# Patient Record
Sex: Female | Born: 1985 | Race: White | Hispanic: No | Marital: Married | Smoking: Current every day smoker
Health system: Southern US, Community
[De-identification: ages and names within clinical notes are randomized; demographics above are authoritative.]

## PROBLEM LIST (undated history)

## (undated) DIAGNOSIS — F419 Anxiety disorder, unspecified: Secondary | ICD-10-CM

## (undated) DIAGNOSIS — F329 Major depressive disorder, single episode, unspecified: Secondary | ICD-10-CM

## (undated) DIAGNOSIS — E282 Polycystic ovarian syndrome: Secondary | ICD-10-CM

## (undated) DIAGNOSIS — K760 Fatty (change of) liver, not elsewhere classified: Secondary | ICD-10-CM

## (undated) DIAGNOSIS — R112 Nausea with vomiting, unspecified: Secondary | ICD-10-CM

## (undated) DIAGNOSIS — M545 Low back pain, unspecified: Secondary | ICD-10-CM

## (undated) DIAGNOSIS — T402X5A Adverse effect of other opioids, initial encounter: Secondary | ICD-10-CM

## (undated) DIAGNOSIS — R000 Tachycardia, unspecified: Secondary | ICD-10-CM

## (undated) DIAGNOSIS — K5903 Drug induced constipation: Secondary | ICD-10-CM

## (undated) DIAGNOSIS — E785 Hyperlipidemia, unspecified: Secondary | ICD-10-CM

## (undated) DIAGNOSIS — E119 Type 2 diabetes mellitus without complications: Secondary | ICD-10-CM

## (undated) DIAGNOSIS — M5410 Radiculopathy, site unspecified: Secondary | ICD-10-CM

## (undated) DIAGNOSIS — M1990 Unspecified osteoarthritis, unspecified site: Secondary | ICD-10-CM

## (undated) DIAGNOSIS — Z9889 Other specified postprocedural states: Secondary | ICD-10-CM

## (undated) DIAGNOSIS — G8929 Other chronic pain: Secondary | ICD-10-CM

## (undated) HISTORY — PX: CHOLECYSTECTOMY: SHX55

## (undated) HISTORY — PX: TUBAL LIGATION: SHX77

## (undated) HISTORY — PX: SPINAL CORD STIMULATOR REMOVAL: SHX5379

## (undated) HISTORY — PX: WISDOM TOOTH EXTRACTION: SHX21

## (undated) HISTORY — PX: LUMBAR DISC SURGERY: SHX700

## (undated) HISTORY — PX: BACK SURGERY: SHX140

---
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
## 2005-10-31 HISTORY — PX: KNEE ARTHROSCOPY: SUR90

## 2012-10-29 ENCOUNTER — Other Ambulatory Visit: Payer: Self-pay | Admitting: Orthopedic Surgery

## 2012-10-31 DIAGNOSIS — F32A Depression, unspecified: Secondary | ICD-10-CM

## 2012-10-31 HISTORY — PX: POSTERIOR LUMBAR FUSION: SHX6036

## 2012-10-31 HISTORY — DX: Depression, unspecified: F32.A

## 2012-11-06 ENCOUNTER — Encounter (HOSPITAL_COMMUNITY): Payer: Self-pay

## 2012-11-06 ENCOUNTER — Encounter (HOSPITAL_COMMUNITY)
Admission: RE | Admit: 2012-11-06 | Discharge: 2012-11-06 | Disposition: A | Discharge status: Home / Self Care | Payer: Worker's Compensation | Source: Ambulatory Visit | Attending: Orthopedic Surgery | Admitting: Orthopedic Surgery

## 2012-11-06 HISTORY — DX: Nausea with vomiting, unspecified: R11.2

## 2012-11-06 HISTORY — DX: Other specified postprocedural states: Z98.890

## 2012-11-06 LAB — COMPREHENSIVE METABOLIC PANEL
ALT: 91 U/L — ABNORMAL HIGH (ref 0–35)
AST: 69 U/L — ABNORMAL HIGH (ref 0–37)
Albumin: 3.7 g/dL (ref 3.5–5.2)
Alkaline Phosphatase: 89 U/L (ref 39–117)
Calcium: 9.6 mg/dL (ref 8.4–10.5)
Creatinine, Ser: 0.47 mg/dL — ABNORMAL LOW (ref 0.50–1.10)
Potassium: 3.6 mEq/L (ref 3.5–5.1)
Total Protein: 7.9 g/dL (ref 6.0–8.3)

## 2012-11-06 LAB — APTT: aPTT: 30 seconds (ref 24–37)

## 2012-11-06 LAB — CBC WITH DIFFERENTIAL/PLATELET
Basophils Absolute: 0.1 10*3/uL (ref 0.0–0.1)
Basophils Relative: 1 % (ref 0–1)
Eosinophils Relative: 5 % (ref 0–5)
HCT: 48.3 % — ABNORMAL HIGH (ref 36.0–46.0)
MCHC: 32.7 g/dL (ref 30.0–36.0)
MCV: 89.6 fL (ref 78.0–100.0)
Monocytes Absolute: 0.7 10*3/uL (ref 0.1–1.0)
Platelets: 273 10*3/uL (ref 150–400)
RDW: 12.7 % (ref 11.5–15.5)

## 2012-11-06 LAB — TYPE AND SCREEN
ABO/RH(D): O POS
Antibody Screen: NEGATIVE

## 2012-11-06 LAB — URINALYSIS, ROUTINE W REFLEX MICROSCOPIC
Glucose, UA: NEGATIVE mg/dL
Ketones, ur: NEGATIVE mg/dL
Leukocytes, UA: NEGATIVE
Nitrite: NEGATIVE
Protein, ur: NEGATIVE mg/dL

## 2012-11-06 LAB — PROTIME-INR: INR: 0.9 (ref 0.00–1.49)

## 2012-11-06 LAB — SURGICAL PCR SCREEN: MRSA, PCR: NEGATIVE

## 2012-11-06 MED ORDER — CHLORHEXIDINE GLUCONATE 4 % EX LIQD: 60.0000 mL | Freq: Once | CUTANEOUS | Status: DC

## 2012-11-06 MED ORDER — CEFAZOLIN SODIUM-DEXTROSE 2-3 GM-% IV SOLR
2.0000 g | INTRAVENOUS | Status: AC
  Administered 2012-11-07: 2 g via INTRAVENOUS
  Filled 2012-11-06: qty 50

## 2012-11-06 NOTE — Pre-Procedure Instructions (Signed)
[address] Misty Stanley  11/06/2012   Your procedure is scheduled on:  Wednesday, January 8th.    Call this number the morning of surgery: [phone] around 8:00 to find out what time you should arrive.   Remember:Nothing to eat or drink after Midnight.   Take these medicines the morning of surgery with A SIP OF WATER: May take Oxycodone and Flexeril if needed.    Do not wear jewelry, make-up or nail polish.  Do not wear lotions, powders, or perfumes. You may wear deodorant.  Do not shave 48 hours prior to surgery. Men may shave face and neck.  Do not bring valuables to the hospital.  Contacts, dentures or bridgework may not be worn into surgery.  Leave suitcase in the car. After surgery it may be brought to your room.  For patients admitted to the hospital, checkout time is 11:00 AM the day of discharge.   Patients discharged the day of surgery will not be allowed to drive home.  Name and phone number of your driver: NA   Shower with CHG wash (Bactoshield) tonight and again in the am prior to arriving to hospital.    Please read over the following fact sheets that you were given: Pain Booklet, Coughing and Deep Breathing, Blood Transfusion Information and Surgical Site Infection Prevention

## 2012-11-07 ENCOUNTER — Encounter (HOSPITAL_COMMUNITY)
Admission: RE | Disposition: A | Discharge status: Home / Self Care | Payer: Self-pay | Source: Ambulatory Visit | Attending: Orthopedic Surgery

## 2012-11-07 ENCOUNTER — Encounter (HOSPITAL_COMMUNITY): Payer: Self-pay | Admitting: Surgery

## 2012-11-07 ENCOUNTER — Encounter (HOSPITAL_COMMUNITY): Payer: Self-pay | Admitting: Anesthesiology

## 2012-11-07 ENCOUNTER — Inpatient Hospital Stay (HOSPITAL_COMMUNITY): Payer: Worker's Compensation

## 2012-11-07 ENCOUNTER — Inpatient Hospital Stay (HOSPITAL_COMMUNITY): Payer: Worker's Compensation | Admitting: Anesthesiology

## 2012-11-07 ENCOUNTER — Inpatient Hospital Stay (HOSPITAL_COMMUNITY)
Admission: RE | Admit: 2012-11-07 | Discharge: 2012-11-10 | DRG: 460 | Disposition: A | Discharge status: Home / Self Care | Payer: Worker's Compensation | Source: Ambulatory Visit | Attending: Orthopedic Surgery | Admitting: Orthopedic Surgery

## 2012-11-07 ENCOUNTER — Other Ambulatory Visit: Payer: Self-pay

## 2012-11-07 DIAGNOSIS — M47812 Spondylosis without myelopathy or radiculopathy, cervical region: Secondary | ICD-10-CM | POA: Diagnosis present

## 2012-11-07 DIAGNOSIS — Z6841 Body Mass Index (BMI) 40.0 and over, adult: Secondary | ICD-10-CM

## 2012-11-07 DIAGNOSIS — M5126 Other intervertebral disc displacement, lumbar region: Principal | ICD-10-CM | POA: Diagnosis present

## 2012-11-07 LAB — HCG, SERUM, QUALITATIVE: Preg, Serum: NEGATIVE

## 2012-11-07 SURGERY — POSTERIOR LUMBAR FUSION 1 LEVEL
Anesthesia: General | Site: Spine Lumbar | Laterality: Left | Wound class: Clean

## 2012-11-07 MED ORDER — ACETAMINOPHEN 325 MG PO TABS: 650.0000 mg | ORAL_TABLET | ORAL | Status: DC | PRN

## 2012-11-07 MED ORDER — DIPHENHYDRAMINE HCL 50 MG/ML IJ SOLN: 12.5000 mg | Freq: Four times a day (QID) | INTRAMUSCULAR | Status: DC | PRN

## 2012-11-07 MED ORDER — HYDROMORPHONE HCL PF 1 MG/ML IJ SOLN
0.2500 mg | INTRAMUSCULAR | Status: DC | PRN
  Administered 2012-11-07 (×2): 0.5 mg via INTRAVENOUS

## 2012-11-07 MED ORDER — MORPHINE SULFATE 2 MG/ML IJ SOLN
2.0000 mg | INTRAMUSCULAR | Status: DC | PRN
  Administered 2012-11-08: 2 mg via INTRAVENOUS
  Filled 2012-11-07 (×2): qty 1

## 2012-11-07 MED ORDER — ZOLPIDEM TARTRATE 5 MG PO TABS
5.0000 mg | ORAL_TABLET | Freq: Every evening | ORAL | Status: DC | PRN
  Administered 2012-11-10: 5 mg via ORAL
  Filled 2012-11-07: qty 1

## 2012-11-07 MED ORDER — MORPHINE SULFATE (PF) 1 MG/ML IV SOLN
INTRAVENOUS | Status: DC
  Administered 2012-11-08 (×2): via INTRAVENOUS
  Administered 2012-11-08: 17.52 mg via INTRAVENOUS
  Administered 2012-11-08: 12.53 mg via INTRAVENOUS
  Administered 2012-11-08: 21 mg via INTRAVENOUS
  Filled 2012-11-07 (×2): qty 25

## 2012-11-07 MED ORDER — LIDOCAINE HCL (CARDIAC) 20 MG/ML IV SOLN
INTRAVENOUS | Status: DC | PRN
  Administered 2012-11-07: 10 mg via INTRAVENOUS

## 2012-11-07 MED ORDER — THROMBIN 20000 UNITS EX SOLR
OROMUCOSAL | Status: DC | PRN
  Administered 2012-11-07: 15:00:00 via TOPICAL

## 2012-11-07 MED ORDER — POVIDONE-IODINE 7.5 % EX SOLN
Freq: Once | CUTANEOUS | Status: DC
  Filled 2012-11-07: qty 118

## 2012-11-07 MED ORDER — MORPHINE SULFATE (PF) 1 MG/ML IV SOLN
INTRAVENOUS | Status: AC
  Administered 2012-11-07: 19:00:00
  Filled 2012-11-07: qty 25

## 2012-11-07 MED ORDER — PROPOFOL INFUSION 10 MG/ML OPTIME
INTRAVENOUS | Status: DC | PRN
  Administered 2012-11-07: 75 ug/kg/min via INTRAVENOUS

## 2012-11-07 MED ORDER — POTASSIUM CHLORIDE IN NACL 20-0.9 MEQ/L-% IV SOLN
INTRAVENOUS | Status: DC
  Administered 2012-11-07: via INTRAVENOUS
  Filled 2012-11-07 (×7): qty 1000

## 2012-11-07 MED ORDER — THROMBIN 20000 UNITS EX SOLR
CUTANEOUS | Status: AC
  Filled 2012-11-07: qty 40000

## 2012-11-07 MED ORDER — MENTHOL 3 MG MT LOZG: 1.0000 | LOZENGE | OROMUCOSAL | Status: DC | PRN

## 2012-11-07 MED ORDER — NALOXONE HCL 0.4 MG/ML IJ SOLN: 0.4000 mg | INTRAMUSCULAR | Status: DC | PRN

## 2012-11-07 MED ORDER — ONDANSETRON HCL 4 MG/2ML IJ SOLN: 4.0000 mg | INTRAMUSCULAR | Status: DC | PRN

## 2012-11-07 MED ORDER — SODIUM CHLORIDE 0.9 % IV SOLN: 250.0000 mL | INTRAVENOUS | Status: DC

## 2012-11-07 MED ORDER — HYDROMORPHONE HCL PF 1 MG/ML IJ SOLN
INTRAMUSCULAR | Status: AC
  Filled 2012-11-07: qty 1

## 2012-11-07 MED ORDER — ARTIFICIAL TEARS OP OINT
TOPICAL_OINTMENT | OPHTHALMIC | Status: DC | PRN
  Administered 2012-11-07: 1 via OPHTHALMIC

## 2012-11-07 MED ORDER — OXYCODONE HCL 5 MG PO TABS: 5.0000 mg | ORAL_TABLET | Freq: Once | ORAL | Status: DC | PRN

## 2012-11-07 MED ORDER — CEFAZOLIN SODIUM 1-5 GM-% IV SOLN
1.0000 g | Freq: Three times a day (TID) | INTRAVENOUS | Status: AC
  Administered 2012-11-07 – 2012-11-08 (×2): 1 g via INTRAVENOUS
  Filled 2012-11-07 (×2): qty 50

## 2012-11-07 MED ORDER — ONDANSETRON HCL 4 MG/2ML IJ SOLN: 4.0000 mg | Freq: Four times a day (QID) | INTRAMUSCULAR | Status: DC | PRN

## 2012-11-07 MED ORDER — 0.9 % SODIUM CHLORIDE (POUR BTL) OPTIME
TOPICAL | Status: DC | PRN
  Administered 2012-11-07: 1000 mL

## 2012-11-07 MED ORDER — PHENOL 1.4 % MT LIQD: 1.0000 | OROMUCOSAL | Status: DC | PRN

## 2012-11-07 MED ORDER — PROMETHAZINE HCL 25 MG/ML IJ SOLN
INTRAMUSCULAR | Status: AC
  Administered 2012-11-07: 6.25 mg via INTRAVENOUS
  Filled 2012-11-07: qty 1

## 2012-11-07 MED ORDER — SODIUM CHLORIDE 0.9 % IJ SOLN: 3.0000 mL | INTRAMUSCULAR | Status: DC | PRN

## 2012-11-07 MED ORDER — PROMETHAZINE HCL 25 MG/ML IJ SOLN
6.2500 mg | INTRAMUSCULAR | Status: DC | PRN
  Administered 2012-11-07: 6.25 mg via INTRAVENOUS

## 2012-11-07 MED ORDER — LACTATED RINGERS IV SOLN
INTRAVENOUS | Status: DC | PRN
  Administered 2012-11-07 (×2): via INTRAVENOUS

## 2012-11-07 MED ORDER — THROMBIN 20000 UNITS EX SOLR
CUTANEOUS | Status: DC | PRN
  Administered 2012-11-07: 20000 [IU] via TOPICAL

## 2012-11-07 MED ORDER — FENTANYL CITRATE 0.05 MG/ML IJ SOLN
INTRAMUSCULAR | Status: DC | PRN
  Administered 2012-11-07: 100 ug via INTRAVENOUS
  Administered 2012-11-07 (×3): 50 ug via INTRAVENOUS
  Administered 2012-11-07: 200 ug via INTRAVENOUS
  Administered 2012-11-07 (×4): 100 ug via INTRAVENOUS

## 2012-11-07 MED ORDER — OXYCODONE HCL 5 MG/5ML PO SOLN: 5.0000 mg | Freq: Once | ORAL | Status: DC | PRN

## 2012-11-07 MED ORDER — SCOPOLAMINE 1 MG/3DAYS TD PT72
1.0000 | MEDICATED_PATCH | TRANSDERMAL | Status: DC
  Administered 2012-11-07: 1.5 mg via TRANSDERMAL
  Filled 2012-11-07: qty 1

## 2012-11-07 MED ORDER — BUPIVACAINE-EPINEPHRINE 0.25% -1:200000 IJ SOLN
INTRAMUSCULAR | Status: DC | PRN
  Administered 2012-11-07: 6 mL

## 2012-11-07 MED ORDER — DIAZEPAM 5 MG PO TABS
5.0000 mg | ORAL_TABLET | Freq: Four times a day (QID) | ORAL | Status: DC | PRN
  Administered 2012-11-08 – 2012-11-10 (×9): 5 mg via ORAL
  Filled 2012-11-07 (×9): qty 1

## 2012-11-07 MED ORDER — DOCUSATE SODIUM 100 MG PO CAPS
100.0000 mg | ORAL_CAPSULE | Freq: Two times a day (BID) | ORAL | Status: DC
  Administered 2012-11-07 – 2012-11-10 (×6): 100 mg via ORAL
  Filled 2012-11-07 (×7): qty 1

## 2012-11-07 MED ORDER — ACETAMINOPHEN 650 MG RE SUPP: 650.0000 mg | RECTAL | Status: DC | PRN

## 2012-11-07 MED ORDER — MIDAZOLAM HCL 2 MG/2ML IJ SOLN: 0.5000 mg | Freq: Once | INTRAMUSCULAR | Status: DC | PRN

## 2012-11-07 MED ORDER — SODIUM CHLORIDE 0.9 % IJ SOLN
3.0000 mL | Freq: Two times a day (BID) | INTRAMUSCULAR | Status: DC
  Administered 2012-11-07 – 2012-11-09 (×4): 3 mL via INTRAVENOUS

## 2012-11-07 MED ORDER — ACETAMINOPHEN 10 MG/ML IV SOLN
1000.0000 mg | Freq: Once | INTRAVENOUS | Status: AC
  Administered 2012-11-07: 1000 mg via INTRAVENOUS
  Filled 2012-11-07: qty 100

## 2012-11-07 MED ORDER — MUPIROCIN 2 % EX OINT
TOPICAL_OINTMENT | CUTANEOUS | Status: AC
  Administered 2012-11-07: 1 via NASAL
  Filled 2012-11-07: qty 22

## 2012-11-07 MED ORDER — SODIUM CHLORIDE 0.9 % IJ SOLN: 9.0000 mL | INTRAMUSCULAR | Status: DC | PRN

## 2012-11-07 MED ORDER — DIPHENHYDRAMINE HCL 12.5 MG/5ML PO ELIX
12.5000 mg | ORAL_SOLUTION | Freq: Four times a day (QID) | ORAL | Status: DC | PRN
  Filled 2012-11-07: qty 10

## 2012-11-07 MED ORDER — SENNA 8.6 MG PO TABS
1.0000 | ORAL_TABLET | Freq: Two times a day (BID) | ORAL | Status: DC
  Administered 2012-11-07 – 2012-11-10 (×6): 8.6 mg via ORAL
  Filled 2012-11-07 (×7): qty 1

## 2012-11-07 MED ORDER — MUPIROCIN 2 % EX OINT
TOPICAL_OINTMENT | Freq: Two times a day (BID) | CUTANEOUS | Status: DC
  Administered 2012-11-07: 1 via NASAL
  Administered 2012-11-08 – 2012-11-10 (×5): via NASAL
  Filled 2012-11-07 (×2): qty 22

## 2012-11-07 MED ORDER — PROPOFOL 10 MG/ML IV BOLUS
INTRAVENOUS | Status: DC | PRN
  Administered 2012-11-07: 200 mg via INTRAVENOUS

## 2012-11-07 MED ORDER — MEPERIDINE HCL 25 MG/ML IJ SOLN: 6.2500 mg | INTRAMUSCULAR | Status: DC | PRN

## 2012-11-07 MED ORDER — ACETAMINOPHEN 10 MG/ML IV SOLN
INTRAVENOUS | Status: AC
  Administered 2012-11-07: 1000 mg via INTRAVENOUS
  Filled 2012-11-07: qty 100

## 2012-11-07 MED ORDER — LACTATED RINGERS IV SOLN
INTRAVENOUS | Status: DC
  Administered 2012-11-07 (×2): via INTRAVENOUS

## 2012-11-07 MED ORDER — SODIUM CHLORIDE 0.9 % IV SOLN
INTRAVENOUS | Status: DC | PRN
  Administered 2012-11-07: 17:00:00 via INTRAVENOUS

## 2012-11-07 MED ORDER — OXYCODONE-ACETAMINOPHEN 5-325 MG PO TABS
ORAL_TABLET | ORAL | Status: AC
  Filled 2012-11-07: qty 2

## 2012-11-07 MED ORDER — OXYCODONE-ACETAMINOPHEN 5-325 MG PO TABS
1.0000 | ORAL_TABLET | ORAL | Status: DC | PRN
  Administered 2012-11-07 – 2012-11-10 (×11): 2 via ORAL
  Filled 2012-11-07 (×10): qty 2

## 2012-11-07 MED ORDER — SCOPOLAMINE 1 MG/3DAYS TD PT72
MEDICATED_PATCH | TRANSDERMAL | Status: AC
  Filled 2012-11-07: qty 1

## 2012-11-07 MED ORDER — VECURONIUM BROMIDE 10 MG IV SOLR
INTRAVENOUS | Status: DC | PRN
  Administered 2012-11-07: 2 mg via INTRAVENOUS
  Administered 2012-11-07: 1 mg via INTRAVENOUS
  Administered 2012-11-07: 8 mg via INTRAVENOUS
  Administered 2012-11-07: 1 mg via INTRAVENOUS

## 2012-11-07 MED ORDER — ALUM & MAG HYDROXIDE-SIMETH 200-200-20 MG/5ML PO SUSP: 30.0000 mL | Freq: Four times a day (QID) | ORAL | Status: DC | PRN

## 2012-11-07 MED ORDER — PHENYLEPHRINE HCL 10 MG/ML IJ SOLN
INTRAMUSCULAR | Status: DC | PRN
  Administered 2012-11-07: 80 ug via INTRAVENOUS
  Administered 2012-11-07: 40 ug via INTRAVENOUS
  Administered 2012-11-07: 80 ug via INTRAVENOUS

## 2012-11-07 MED ORDER — ONDANSETRON HCL 4 MG/2ML IJ SOLN
INTRAMUSCULAR | Status: DC | PRN
  Administered 2012-11-07: 4 mg via INTRAVENOUS

## 2012-11-07 SURGICAL SUPPLY — 74 items
BENZOIN TINCTURE PRP APPL 2/3 (GAUZE/BANDAGES/DRESSINGS) ×2 IMPLANT
BLADE SURG ROTATE 9660 (MISCELLANEOUS) IMPLANT
BUR ROUND PRECISION 4.0 (BURR) ×2 IMPLANT
CAGE CONCORDE BULLET 9X11X27 (Cage) ×1 IMPLANT
CAGE SPNL 5D BLT NOSE 27X9X11 (Cage) ×1 IMPLANT
CARTRIDGE OIL MAESTRO DRILL (MISCELLANEOUS) IMPLANT
CLOTH BEACON ORANGE TIMEOUT ST (SAFETY) ×2 IMPLANT
CLSR STERI-STRIP ANTIMIC 1/2X4 (GAUZE/BANDAGES/DRESSINGS) ×2 IMPLANT
CONT SPEC STER OR (MISCELLANEOUS) ×2 IMPLANT
CORDS BIPOLAR (ELECTRODE) ×2 IMPLANT
COVER SURGICAL LIGHT HANDLE (MISCELLANEOUS) ×2 IMPLANT
DIFFUSER DRILL AIR PNEUMATIC (MISCELLANEOUS) IMPLANT
DRAIN CHANNEL 15F RND FF W/TCR (WOUND CARE) IMPLANT
DRAPE C-ARM 42X72 X-RAY (DRAPES) ×2 IMPLANT
DRAPE ORTHO SPLIT 77X108 STRL (DRAPES) ×1
DRAPE POUCH INSTRU U-SHP 10X18 (DRAPES) ×2 IMPLANT
DRAPE SURG 17X23 STRL (DRAPES) ×6 IMPLANT
DRAPE SURG ORHT 6 SPLT 77X108 (DRAPES) ×1 IMPLANT
DURAPREP 26ML APPLICATOR (WOUND CARE) ×2 IMPLANT
ELECT BLADE 4.0 EZ CLEAN MEGAD (MISCELLANEOUS) ×2
ELECT CAUTERY BLADE 6.4 (BLADE) ×2 IMPLANT
ELECT REM PT RETURN 9FT ADLT (ELECTROSURGICAL) ×2
ELECTRODE BLDE 4.0 EZ CLN MEGD (MISCELLANEOUS) ×1 IMPLANT
ELECTRODE REM PT RTRN 9FT ADLT (ELECTROSURGICAL) ×1 IMPLANT
EVACUATOR SILICONE 100CC (DRAIN) IMPLANT
GAUZE SPONGE 4X4 16PLY XRAY LF (GAUZE/BANDAGES/DRESSINGS) ×8 IMPLANT
GLOVE BIO SURGEON STRL SZ8 (GLOVE) ×2 IMPLANT
GLOVE BIOGEL PI IND STRL 8 (GLOVE) ×1 IMPLANT
GLOVE BIOGEL PI INDICATOR 8 (GLOVE) ×1
GOWN PREVENTION PLUS XLARGE (GOWN DISPOSABLE) ×2 IMPLANT
GOWN STRL NON-REIN LRG LVL3 (GOWN DISPOSABLE) ×4 IMPLANT
IV CATH 14GX2 1/4 (CATHETERS) ×2 IMPLANT
KIT BASIN OR (CUSTOM PROCEDURE TRAY) ×2 IMPLANT
KIT POSITION SURG JACKSON T1 (MISCELLANEOUS) ×2 IMPLANT
KIT ROOM TURNOVER OR (KITS) ×2 IMPLANT
MARKER SKIN DUAL TIP RULER LAB (MISCELLANEOUS) ×2 IMPLANT
NEEDLE BONE MARROW 8GX6 FENEST (NEEDLE) IMPLANT
NEEDLE HYPO 25GX1X1/2 BEV (NEEDLE) ×2 IMPLANT
NEEDLE SPNL 18GX3.5 QUINCKE PK (NEEDLE) ×4 IMPLANT
NEURO MONITORING STIM (LABOR (TRAVEL & OVERTIME)) ×2 IMPLANT
NS IRRIG 1000ML POUR BTL (IV SOLUTION) ×2 IMPLANT
OIL CARTRIDGE MAESTRO DRILL (MISCELLANEOUS)
PACK LAMINECTOMY ORTHO (CUSTOM PROCEDURE TRAY) ×2 IMPLANT
PACK UNIVERSAL I (CUSTOM PROCEDURE TRAY) ×2 IMPLANT
PACK VITOSS BIOACTIVE 10CC (Neuro Prosthesis/Implant) ×2 IMPLANT
PAD ARMBOARD 7.5X6 YLW CONV (MISCELLANEOUS) ×4 IMPLANT
PATTIES SURGICAL .5 X1 (DISPOSABLE) ×2 IMPLANT
PATTIES SURGICAL .5X1.5 (GAUZE/BANDAGES/DRESSINGS) ×2 IMPLANT
ROD EXEDIUM PREBENT 5.5 40MM (Rod) ×1 IMPLANT
ROD EXEDIUM PREBENT 5.5X40 (Rod) ×1 IMPLANT
ROD EXPEDIUM PREBENT 5.5X35MM (Rod) ×2 IMPLANT
SCREW EXPEDIUM POLYAXIAL 7X40M (Screw) ×4 IMPLANT
SCREW POLYAXIAL 7X45MM (Screw) ×4 IMPLANT
SCREW SET SINGLE INNER (Screw) ×8 IMPLANT
SPONGE GAUZE 4X4 12PLY (GAUZE/BANDAGES/DRESSINGS) ×2 IMPLANT
SPONGE INTESTINAL PEANUT (DISPOSABLE) ×2 IMPLANT
SPONGE SURGIFOAM ABS GEL 100 (HEMOSTASIS) ×2 IMPLANT
STRIP CLOSURE SKIN 1/2X4 (GAUZE/BANDAGES/DRESSINGS) IMPLANT
SURGIFLO TRUKIT (HEMOSTASIS) IMPLANT
SUT MNCRL AB 4-0 PS2 18 (SUTURE) ×4 IMPLANT
SUT VIC AB 0 CT1 18XCR BRD 8 (SUTURE) ×2 IMPLANT
SUT VIC AB 0 CT1 8-18 (SUTURE) ×2
SUT VIC AB 1 CT1 18XCR BRD 8 (SUTURE) ×3 IMPLANT
SUT VIC AB 1 CT1 8-18 (SUTURE) ×3
SUT VIC AB 2-0 CT2 18 VCP726D (SUTURE) ×2 IMPLANT
SYR 20CC LL (SYRINGE) ×2 IMPLANT
SYR BULB IRRIGATION 50ML (SYRINGE) ×2 IMPLANT
SYR CONTROL 10ML LL (SYRINGE) ×2 IMPLANT
TAPE CLOTH SURG 4X10 WHT LF (GAUZE/BANDAGES/DRESSINGS) ×2 IMPLANT
TOWEL OR 17X24 6PK STRL BLUE (TOWEL DISPOSABLE) ×2 IMPLANT
TOWEL OR 17X26 10 PK STRL BLUE (TOWEL DISPOSABLE) ×2 IMPLANT
TRAY FOLEY CATH 14FR (SET/KITS/TRAYS/PACK) ×2 IMPLANT
WATER STERILE IRR 1000ML POUR (IV SOLUTION) ×2 IMPLANT
YANKAUER SUCT BULB TIP NO VENT (SUCTIONS) ×2 IMPLANT

## 2012-11-07 NOTE — H&P (Signed)
PREOPERATIVE H&P  Chief Complaint: left leg pain  HPI: Janice Wright is a 27 y.o. female who presents with left leg pain s/p 2 previous decompressions previously  Past Medical History  Diagnosis Date  . PONV (postoperative nausea and vomiting)    Past Surgical History  Procedure Date  . Knee arthroscopy     left  . Cesarean section   . Back surgery     x 2-   . Cholecystectomy   . Wisdom tooth extraction    History   Social History  . Marital Status: Married    Spouse Name: N/A    Number of Children: N/A  . Years of Education: N/A   Social History Main Topics  . Smoking status: Never Smoker   . Smokeless tobacco: Not on file  . Alcohol Use: No  . Drug Use: No  . Sexually Active: Yes    Birth Control/ Protection: None   Other Topics Concern  . Not on file   Social History Narrative  . No narrative on file   No family history on file. No Known Allergies Prior to Admission medications   Medication Sig Start Date End Date Taking? Authorizing Provider  cyclobenzaprine (FLEXERIL) 10 MG tablet Take 10 mg by mouth 3 (three) times daily as needed. For spasms   Yes Historical Provider, MD  Menthol, Topical Analgesic, (ICY HOT EX) Apply 1 application topically at bedtime as needed. For back pain   Yes Historical Provider, MD  oxyCODONE-acetaminophen (PERCOCET/ROXICET) 5-325 MG per tablet Take 1 tablet by mouth every 6 (six) hours as needed. For pain   Yes Historical Provider, MD     All other systems have been reviewed and were otherwise negative with the exception of those mentioned in the HPI and as above.  Physical Exam: There were no vitals filed for this visit.  General: Alert, no acute distress Cardiovascular: No pedal edema Respiratory: No cyanosis, no use of accessory musculature GI: No organomegaly, abdomen is soft and non-tender Skin: No lesions in the area of chief complaint Neurologic: Sensation intact distally Psychiatric: Patient is competent for  consent with normal mood and affect Lymphatic: No axillary or cervical lymphadenopathy  Assessment/Plan: Left leg pain Plan for Procedure(s): POSTERIOR LUMBAR FUSION 1 LEVEL   [name], MD 11/07/2012 7:38 AM

## 2012-11-07 NOTE — Transfer of Care (Signed)
Immediate Anesthesia Transfer of Care Note  Patient: Janice Wright  Procedure(s) Performed: Procedure(s) (LRB) with comments: POSTERIOR LUMBAR FUSION 1 LEVEL (Left) - Left sided lumbar 5-sacrum 1 transforaminal lumbar interbody fusion with instrumentation, vitoss, bone marrow aspirate.  Patient Location: PACU  Anesthesia Type:General  Level of Consciousness: awake  Airway & Oxygen Therapy: Patient Spontanous Breathing and Patient connected to nasal cannula oxygen  Post-op Assessment: Report given to PACU RN, Post -op Vital signs reviewed and stable and Patient moving all extremities  Post vital signs: Reviewed and stable  Complications: No apparent anesthesia complications

## 2012-11-07 NOTE — Anesthesia Postprocedure Evaluation (Signed)
  Anesthesia Post-op Note  Patient: Janice Wright  Procedure(s) Performed: Procedure(s) (LRB) with comments: POSTERIOR LUMBAR FUSION 1 LEVEL (Left) - Left sided lumbar 5-sacrum 1 transforaminal lumbar interbody fusion with instrumentation, vitoss, bone marrow aspirate.  Patient Location: PACU  Anesthesia Type:General  Level of Consciousness: awake and alert   Airway and Oxygen Therapy: Patient Spontanous Breathing and Patient connected to nasal cannula oxygen  Post-op Pain: moderate  Post-op Assessment: Post-op Vital signs reviewed, Patient's Cardiovascular Status Stable, Respiratory Function Stable, Patent Airway and No signs of Nausea or vomiting  Post-op Vital Signs: Reviewed and stable  Complications: No apparent anesthesia complications

## 2012-11-07 NOTE — Progress Notes (Signed)
Noted that pt's first name spelled incorrectly on armband and labels.  Corrected labels applied.  Blood bank notified of change to blood arm band per Durwin Glaze and he was instructed to apply new label on old and place back in plastic sleeve. Canary Brim followed through on this before venipuncture for serum preg.

## 2012-11-07 NOTE — Preoperative (Signed)
Beta Blockers   Reason not to administer Beta Blockers:Not Applicable2

## 2012-11-07 NOTE — OR Nursing (Signed)
Report to d rees rn

## 2012-11-07 NOTE — Anesthesia Procedure Notes (Signed)
Procedure Name: Intubation Date/Time: 11/07/2012 1:55 PM Performed by: Carmela Rima Pre-anesthesia Checklist: Patient identified, Timeout performed, Emergency Drugs available, Suction available and Patient being monitored Patient Re-evaluated:Patient Re-evaluated prior to inductionOxygen Delivery Method: Circle system utilized Preoxygenation: Pre-oxygenation with 100% oxygen Intubation Type: IV induction Ventilation: Mask ventilation without difficulty Laryngoscope Size: Mac and 4 Grade View: Grade I Tube type: Oral Tube size: 7.5 mm Number of attempts: 1 Placement Confirmation: ETT inserted through vocal cords under direct vision,  positive ETCO2 and breath sounds checked- equal and bilateral Secured at: 22 cm Tube secured with: Tape Dental Injury: Teeth and Oropharynx as per pre-operative assessment

## 2012-11-07 NOTE — Anesthesia Preprocedure Evaluation (Addendum)
Anesthesia Evaluation  Patient identified by MRN, date of birth, ID band Patient awake    Reviewed: Allergy & Precautions, H&P , NPO status , Patient's Chart, lab work & pertinent test results, reviewed documented beta blocker date and time   History of Anesthesia Complications (+) PONV  Airway Mallampati: II TM Distance: >3 FB Neck ROM: Full    Dental No notable dental hx. (+) Teeth Intact and Dental Advisory Given   Pulmonary neg pulmonary ROS,  breath sounds clear to auscultation  Pulmonary exam normal       Cardiovascular negative cardio ROS  Rhythm:Regular Rate:Normal     Neuro/Psych Chronic back pain: narcotics daily negative neurological ROS     GI/Hepatic negative GI ROS, Neg liver ROS,   Endo/Other  Morbid obesity  Renal/GU negative Renal ROS     Musculoskeletal   Abdominal (+) + obese,   Peds  Hematology   Anesthesia Other Findings   Reproductive/Obstetrics LMP 11/13, preg test today NEG                           Anesthesia Physical Anesthesia Plan  ASA: II  Anesthesia Plan: General   Post-op Pain Management:    Induction: Intravenous  Airway Management Planned: Oral ETT  Additional Equipment:   Intra-op Plan:   Post-operative Plan: Extubation in OR  Informed Consent: I have reviewed the patients History and Physical, chart, labs and discussed the procedure including the risks, benefits and alternatives for the proposed anesthesia with the patient or authorized representative who has indicated his/her understanding and acceptance.   Dental advisory given and Dental Advisory Given  Plan Discussed with: CRNA, Surgeon and Anesthesiologist  Anesthesia Plan Comments: (Plan routine monitors, GETA)       Anesthesia Quick Evaluation

## 2012-11-08 MED ORDER — OXYCODONE HCL ER 10 MG PO T12A
20.0000 mg | EXTENDED_RELEASE_TABLET | Freq: Two times a day (BID) | ORAL | Status: DC
  Administered 2012-11-08 – 2012-11-10 (×5): 20 mg via ORAL
  Filled 2012-11-08 (×3): qty 1
  Filled 2012-11-08: qty 2
  Filled 2012-11-08: qty 1
  Filled 2012-11-08 (×2): qty 2

## 2012-11-08 MED ORDER — INFLUENZA VIRUS VACC SPLIT PF IM SUSP
0.5000 mL | INTRAMUSCULAR | Status: AC
  Administered 2012-11-09: 0.5 mL via INTRAMUSCULAR
  Filled 2012-11-08: qty 0.5

## 2012-11-08 NOTE — Evaluation (Signed)
Physical Therapy Evaluation Patient Details Name: Janice Wright MRN: [id number] DOB: Mar 12, 1986 Today's Date: 11/08/2012 Time: 4098-1191 PT Time Calculation (min): 28 min  PT Assessment / Plan / Recommendation Clinical Impression  27  yo female admitted for spinal fusion due to back and leg pain and weakness.  She has a rigid TLSO with sternal and thigh  attachment that has full flexion at the hip joint. 2 people were required to apply brace in standing and thigh attachment did not fit properly with intial attempt.  Pt  would like to have sternal attachment lowered as it protrudes into her neck when sitting..  Other than difficulties with brace management, pt did will with inital mobility and amublation.  Antiticpate whe will need wide RW at home, but will nto need HHPT.  Recoommend eventual outpatient PT when she is healed from suregery for core strengthening and body mechanics instruction.    PT Assessment  Patient needs continued PT services    Follow Up Recommendations  No PT follow up    Does the patient have the potential to tolerate intense rehabilitation      Barriers to Discharge        Equipment Recommendations  Rolling walker with 5" wheels (wide)    Recommendations for Other Services     Frequency Min 6X/week    Precautions / Restrictions Precautions Precautions: Back Precaution Comments: pt verbally instructed in back precautions several times thoughout treatment Required Braces or Orthoses: Spinal Brace Spinal Brace: Thoracolumbosacral orthotic;Applied in standing position;Other (comment) Spinal Brace Comments: Difficulty securing leg attachment.  Also, Pt reports that stenal attachment feels too high when she is sitting Restrictions Weight Bearing Restrictions: No   Pertinent Vitals/Pain Pt reports pain 9/10 after ambulation.  Pt using PCA .  RN notified of pt request to have muscle relaxer medication      Mobility  Bed Mobility Bed Mobility: Sit to  Supine Sit to Supine: 4: Min assist Details for Bed Mobility Assistance: pt up OOB on BSC upon my arrival For return to bed, verbal cues for log rolling and min assist to bring legs up onto bed Transfers Transfers: Sit to Stand;Stand to Sit Sit to Stand: 1: +2 Total assist Sit to Stand: Patient Percentage: 80% Stand to Sit: 1: +2 Total assist Stand to Sit: Patient Percentage: 80% Details for Transfer Assistance: pt grimaces with increased pain with transition from sit to stand .  Ambulation/Gait Ambulation/Gait Assistance: 4: Min assist Ambulation Distance (Feet): 50 Feet Assistive device: Rolling walker (wide) Ambulation/Gait Assistance Details: assit for safety in gait.  Gait velocity: decreased General Gait Details: pt with increased pain in back with ambulation, but does not have LLE pain.  Pt is able to use RW well for support Stairs: No Wheelchair Mobility Wheelchair Mobility: No    Shoulder Instructions     Exercises Other Exercises Other Exercises: abd sets, glute sets, lower core isometrics    PT Diagnosis: Acute pain;Difficulty walking  PT Problem List: Pain;Decreased mobility;Decreased activity tolerance;Other (comment) (decreased ability to manage brace) PT Treatment Interventions: DME instruction;Functional mobility training;Patient/family education;Therapeutic exercise   PT Goals Acute Rehab PT Goals PT Goal Formulation: With patient Time For Goal Achievement: 11/15/12 Potential to Achieve Goals: Good Pt will go Supine/Side to Sit: Independently PT Goal: Supine/Side to Sit - Progress: Goal set today Pt will go Sit to Supine/Side: Independently PT Goal: Sit to Supine/Side - Progress: Goal set today Pt will go Sit to Stand: with modified independence PT Goal: Sit to Stand -  Progress: Goal set today Pt will go Stand to Sit: with modified independence PT Goal: Stand to Sit - Progress: Goal set today Pt will Ambulate: 51 - 150 feet;with modified independence;with  least restrictive assistive device PT Goal: Ambulate - Progress: Goal set today Pt will Go Up / Down Stairs: 3-5 stairs;with min assist PT Goal: Up/Down Stairs - Progress: Goal set today Additional Goals Additional Goal #1: pt will state 3/3 back precautions  Visit Information  Last PT Received On: 11/08/12 Assistance Needed: +2    Subjective Data  Subjective: "I wish I could pee!" Patient Stated Goal: to return home with family   Prior Functioning  Home Living Lives With: Family (spouse and 2yo daughter) Available Help at Discharge: Family;Available 24 hours/day;Other (Comment) (Parents will help while husband works) Type of Home: Mobile home Home Access: Stairs to enter Secretary/administrator of Steps: 4 Entrance Stairs-Rails:  (rail is not sturdy) Home Layout: One level Home Adaptive Equipment: None Prior Function Level of Independence: Independent with assistive device(s) (occasional use of cane) Communication Communication: No difficulties    Cognition  Overall Cognitive Status: Appears within functional limits for tasks assessed/performed Arousal/Alertness: Awake/alert Orientation Level: Appears intact for tasks assessed Behavior During Session: [facility] for tasks performed    Extremity/Trunk Assessment Right Lower Extremity Assessment RLE ROM/Strength/Tone: WFL for tasks assessed RLE Sensation: WFL - Light Touch;WFL - Proprioception Left Lower Extremity Assessment LLE ROM/Strength/Tone: WFL for tasks assessed LLE Sensation: WFL - Light Touch Trunk Assessment Trunk Assessment: Other exceptions Trunk Exceptions: obese abdomen   Balance Balance Balance Assessed: Yes Static Sitting Balance Static Sitting - Balance Support: No upper extremity supported Static Sitting - Level of Assistance: 7: Independent Static Standing Balance Static Standing - Balance Support: Bilateral upper extremity supported Static Standing - Level of Assistance: 6: Modified independent  (Device/Increase time) Static Standing - Comment/# of Minutes: pt able to stand for several minutes while 2 people worked to apply brace  End of Session PT - End of Session Equipment Utilized During Treatment: Gait belt Activity Tolerance: Patient limited by pain Patient left: in bed;with family/visitor present Nurse Communication: Mobility status  GP     Rosey Bath K. Manson Passey, PT [phone] 11/08/2012, 11:01 AM

## 2012-11-08 NOTE — Progress Notes (Signed)
Patient reports resolved left leg pain, + LBP  BP 122/58  Pulse 112  Temp 97.1 F (36.2 C) (Oral)  Resp 18  SpO2 93%  LMP 08/31/2012  NVI Dressing CDI  POD #1 after left 5/1 TLIF  - up with PT with brace - d/c PCA, start percocet/valium - back precautions at all times - patient concerned about getting high postop pain medication, will add 20 mg oxycontin to regimen

## 2012-11-08 NOTE — Progress Notes (Signed)
UR COMPLETED  

## 2012-11-08 NOTE — Op Note (Signed)
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  POSTOPERATIVE DIAGNOSIS:  Recurrent left-sided, L5-S1 foraminal stenosis resulting in severe left-sided L5 radiculopathy.  PROCEDURE: 1. Revision left-sided L5-S1, left-sided L5-S1 foraminal     decompression. 2. Left-sided L5-S1 transforaminal lumbar interbody fusion. 3. Right-sided L5-S1 posterolateral fusion. 4. Placement of posterior instrumentation, L5-S1 (7 x 45 mm screws at     L5, 7 x 40 mm screws at S1). 5. Placement of interbody device x1 (11 mm x 27 mm lordotic Concorde     bullet cage). 6. Use of local autograft. 7. Intraoperative bone marrow aspiration from the patient's right     iliac crest using a separate incision. 8. Intraoperative use of fluoroscopy.  SURGEON:  [name], MD  ASSISTANT:  Jason Coop, PA-C  ANESTHESIA:  General endotracheal anesthesia.  COMPLICATIONS:  None.  DISPOSITION:  Stable.  ESTIMATED BLOOD LOSS:  200 mL.  INDICATIONS FOR PROCEDURE:  Briefly, Janice Wright is a very pleasant 27- year-old female who is status post total of 2 decompressive procedures at the L5-S1 level.  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  She then went on to have left-sided low back pain and pain on the leg.  She had pain very much in the distribution of the L5 nerve on the left side.  Specifically, the pain involved the lateral thigh to go on to the lateral leg.  Pain also extended to the top of the foot, again, very much in the distribution of the L5 nerve.   Of note, the patient states that her most recent decompressive procedure, she got no improvement in which she described was very similar pain to the pain I did discuss with her.  Also of note, the patient did have extensive nonoperative measures including a left-sided L5-S1 nerve block.  The patient states that the L5 nerve block did not provide her any improvement at all.  However, I did review an MRI, which was clearly notable for severe left-sided L5-S1 foraminal stenosis, which did appear to be causing compression of the exiting L5 nerve.  There was also a right-sided L4-5 disk protrusion.  Of note, the patient did not have any right-sided leg pain, and therefore I did feel that the L4-5 right-sided disk protrusion was correlating to the patient's symptomatology.  Given her ongoing and severe debilitating pain and lack of improvement with the measures reflected above, we did discuss going forward with a revision decompressive procedure.  As this will now be her 3rd decompressive procedure at the L5-S1 level, we did also discussed going forward with an instrumented fusion.  The patient fully understood the risks and limitations of the procedure as outlined in my preoperative note.  Of particular  note, the patient did understand that there may be some permanency to her nerve injury, and if there was, she would likely not gain benefit from surgical intervention.  She did also understand the goal of surgery was to address the leg pain not the back pain.  OPERATIVE DETAILS:  On November 07, 2012, the patient was brought to surgery and general endotracheal anesthesia was administered.  The patient was placed prone on a well-padded flat Jackson bed on a spinal frame.  Antibiotics were given and a time-out procedure was performed. I then made an incision overlying the patient's previous skin incision, overlying the L5-S1 interspace.  The paraspinal musculature was bluntly swept laterally on  the right and the left sides.  The transverse processes of L5 were subperiosteally exposed bilaterally, as were the sacral ala on the right and left sides.  The L5-S1 facet joint was readily identified and noted to be very arthritic with significant bony spurring noted on both the right and the left sides.  On the patient's right side, I did cannulate the L5 and S1 pedicles using a 4-mm bur followed by a curved gearshift probe followed by a 6-mm tap.  I did use lateral fluoroscopy while advancing the gearshift to confirm appropriate trajectory of the pedicle screws.  I then placed a 7 x 45 mm screw at L5 and a 7 x 40 mm screw at S1.  Also, I did gain purchase of the anterior cortex of S1 in order to help optimize the stability of the construct. I then used a 4-mm bur to liberally decorticate the posterior elements on the right side.  A 40-mm rod was then placed and distraction was applied across the rod.  I then turned my attention towards the patient's left side and the pedicles were and cannulated in the manner described previously.  Bone wax was placed at the entry site of the pedicle holes.  I then went forward with a full facetectomy removing the inferior articular process of L5.  I then proceeded with removal of the superior articular process of S1.  Of note, there was abundant scar tissue noted medially on the ventral surface of the facet joint, as expected, given the fact the patient did have a previous decompressive procedure x2.  There was abundant scar noted about the traversing S1 nerve.  The L5 nerve again was noted to be under significant compression secondary to the facet hypertrophy and the intervertebral disk in the foraminal area.  However, all undue pressure was entirely removed and at this portion of the procedure, I was able to thoroughly visualize the exiting L5 nerve at the axilla all the way out past the foraminal area and there was no compression throughout the  course of the nerve.  Of note, the decompressive procedure was a very meticulous portion of the procedure, given the fact that this was the patient's 3rd surgery.  This did take significant attention and meticulous detail to ensure that there was no undue trauma to the exiting L5 nerve.  Normally, this portion of the procedure takes approximately 15-20 minutes, whereas in this particular situation, it did take approximately 60 minutes, given the significant adhesions noted about the exiting L5 nerve.  I then went forward with a posterolateral annulotomy.  A 15 blade knife was used to perform an annulotomy at the posterolateral aspect of the intervertebral disk on the left side.  I then used a series of curettes to perform a thorough and complete diskectomy.  I was  very pleased with the diskectomy.  The endplates were prepared as well.  I then placed a series of trials and did feel that a lordotic 11 mm interbody device would be the most appropriate fit.  At this point, I did make a separate incision over the patient's right iliac crest and 8 mL of bone marrow aspirate was harvested.  This was mixed with 10 mL of Vitoss BA.  The Vitoss/autograft mixture was packed liberally into the intervertebral space.  The interbody device was also packed and tamped into position in the usual fashion.  I was very pleased with the final press fit.  I then placed L5 and S1 pedicle screws using the same length and diameter as previously discussed on the contralateral side.  Distraction was then discontinued on the contralateral side.  A 35 mm rod was placed on the left.  Caps were placed followed by final locking procedure.  Final locking was then performed on the patient's right side.  At this point, I did copiously irrigate the wound.  I then packed autograft and Vitoss into the right posterolateral gutter.  I then obtained AP and lateral fluoroscopic views and was very pleased with the final  radiographic appearance.  At this point, I did close the fascia using #1 Vicryl.  The deep subcutaneous layer was closed using 0-Vicryl and superficial subcutaneous layer was closed using 2-0 Vicryl.  The skin was closed using 3-0 Monocryl.  Benzoin and Steri-Strips were then applied followed by sterile dressing.  All instrument counts were correct at the termination of the procedure.  Of note, I did use neurologic monitoring throughout the procedure and there was no abnormal EMG activity throughout the procedure.  Also of note I, did use triggered EMG to test the screws on the left side.  There no screw that tested below 10 milliamps.  Also of note, Jason Coop was my assistant throughout the procedure and aided in essential retraction and suctioning required throughout the surgery.     [name], MD     MD/MEDQ  D:  11/07/2012  T:  11/08/2012  Job:  [id number]

## 2012-11-08 NOTE — Evaluation (Signed)
Occupational Therapy Evaluation Patient Details Name: Janice Wright MRN: [id number] DOB: 1986/05/06 Today's Date: 11/08/2012 Time: 0935-1000 OT Time Calculation (min): 25 min  OT Assessment / Plan / Recommendation Clinical Impression  Pt s/p TLIF on 11/08/11. Pt limited by pain this session but is mobilizing well. Pt/family will need to be educated how to don rigid TLSO with leg attachment. (Leg attachment did not appear to fit properly). Skilled OT indicated to maximize independence with BADLs to supervision level in prep for safe d/c home with family assist.    OT Assessment  Patient needs continued OT Services    Follow Up Recommendations  No OT follow up    Barriers to Discharge      Equipment Recommendations  3 in 1 bedside comode    Recommendations for Other Services    Frequency  Min 2X/week    Precautions / Restrictions Precautions Precautions: Back Precaution Comments: pt verbally instructed in back precautions several times thoughout treatment Required Braces or Orthoses: Spinal Brace Spinal Brace: Thoracolumbosacral orthotic;Applied in standing position;Other (comment) Spinal Brace Comments: Difficulty securing leg attachment.  Also, Pt reports that stenal attachment feels too high when she is sitting Restrictions Weight Bearing Restrictions: No   Pertinent Vitals/Pain Pt reported significant pain at rest and with mobility. Repositioned for comfort.    ADL  Grooming: Set up Where Assessed - Grooming: Unsupported sitting Upper Body Bathing: Minimal assistance Where Assessed - Upper Body Bathing: Unsupported sitting Lower Body Bathing: Moderate assistance Where Assessed - Lower Body Bathing: Supported sit to stand Upper Body Dressing: Minimal assistance Where Assessed - Upper Body Dressing: Unsupported sitting Lower Body Dressing: Moderate assistance Where Assessed - Lower Body Dressing: Unsupported sit to stand Toilet Transfer: +2 Total assistance Toilet  Transfer: Patient Percentage: 80% Toilet Transfer Method: Sit to stand Toilet Transfer Equipment: Bedside commode Toileting - Clothing Manipulation and Hygiene: Minimal assistance Where Assessed - Glass blower/designer Manipulation and Hygiene: Sit to stand from 3-in-1 or toilet Equipment Used: Rolling walker;Gait belt;Back brace ADL Comments: Required 2 people to apply rigid TLSO with thigh attachment with pt in standing position. Pt transferred onto 3n1 x2, feeling the urge to urinate but was unable. Pt unable to cross ankle over opposite knee at this time with the brace in place so will likely need AE ed.    OT Diagnosis: Generalized weakness  OT Problem List: Decreased activity tolerance;Decreased knowledge of use of DME or AE;Pain OT Treatment Interventions: Self-care/ADL training;Therapeutic activities;DME and/or AE instruction;Patient/family education   OT Goals Acute Rehab OT Goals OT Goal Formulation: With patient Time For Goal Achievement: 11/22/12 Potential to Achieve Goals: Good ADL Goals Pt Will Perform Grooming: with supervision;Standing at sink ADL Goal: Grooming - Progress: Goal set today Pt Will Transfer to Toilet: with supervision;Ambulation;with DME;Maintaining back safety precautions ADL Goal: Toilet Transfer - Progress: Goal set today Pt Will Perform Toileting - Clothing Manipulation: with supervision;Sitting on 3-in-1 or toilet;Standing ADL Goal: Toileting - Clothing Manipulation - Progress: Goal set today Pt Will Perform Toileting - Hygiene: with supervision;Sit to stand from 3-in-1/toilet ADL Goal: Toileting - Hygiene - Progress: Goal set today Additional ADL Goal #1: Pt will complete all aspects of bathing and dressing with setup/min A using AE prn, maintaining all back safety precautions with min cues. ADL Goal: Additional Goal #1 - Progress: Goal set today Additional ADL Goal #2: Family will  correctly don pt's TLSO with supervision and min cues. ADL Goal:  Additional Goal #2 - Progress: Goal set today  Visit Information  Last OT Received On: 11/08/12 Assistance Needed: +2 PT/OT Co-Evaluation/Treatment: Yes    Subjective Data  Subjective: This brace hits me in the face when I sit. Patient Stated Goal: Eventually return to work.   Prior Functioning     Home Living Lives With: Family (spouse and 2yo daughter) Available Help at Discharge: Family;Available 24 hours/day;Other (Comment) (Parents will help while husband works) Type of Home: Mobile home Home Access: Stairs to enter Secretary/administrator of Steps: 4 Entrance Stairs-Rails:  (rail is not sturdy) Home Layout: One level Bathroom Shower/Tub: Network engineer: None Prior Function Level of Independence: Independent with assistive device(s) (occasional use of cane) Able to Take Stairs?: Yes Driving: Yes Vocation: Full time employment Communication Communication: No difficulties Dominant Hand: Right         Vision/Perception     Cognition  Overall Cognitive Status: Appears within functional limits for tasks assessed/performed Arousal/Alertness: Awake/alert Orientation Level: Appears intact for tasks assessed Behavior During Session: [facility] for tasks performed    Extremity/Trunk Assessment Right Upper Extremity Assessment RUE ROM/Strength/Tone: [facility] for tasks assessed Left Upper Extremity Assessment LUE ROM/Strength/Tone: WFL for tasks assessed Right Lower Extremity Assessment RLE ROM/Strength/Tone: WFL for tasks assessed RLE Sensation: WFL - Light Touch;WFL - Proprioception Left Lower Extremity Assessment LLE ROM/Strength/Tone: WFL for tasks assessed LLE Sensation: WFL - Light Touch Trunk Assessment Trunk Assessment: Other exceptions Trunk Exceptions: obese abdomen     Mobility Bed Mobility Bed Mobility: Sit to Supine Sit to Supine: 4: Min assist Details for Bed Mobility Assistance: pt up OOB on BSC upon my  arrival For return to bed, verbal cues for log rolling and min assist to bring legs up onto bed Transfers Sit to Stand: 1: +2 Total assist Sit to Stand: Patient Percentage: 80% Stand to Sit: 1: +2 Total assist Stand to Sit: Patient Percentage: 80% Details for Transfer Assistance: pt grimaces with increased pain with transition from sit to stand .      Shoulder Instructions     Exercise    Balance Balance Balance Assessed: Yes Static Sitting Balance Static Sitting - Balance Support: No upper extremity supported Static Sitting - Level of Assistance: 7: Independent Static Standing Balance Static Standing - Balance Support: Bilateral upper extremity supported Static Standing - Level of Assistance: 6: Modified independent (Device/Increase time) Static Standing - Comment/# of Minutes: pt able to stand for several minutes while 2 people worked to apply brace   End of Session OT - End of Session Equipment Utilized During Treatment: Gait belt;Back brace Activity Tolerance: Patient limited by pain Patient left: in bed;with call bell/phone within reach  GO     Trannie Bardales A OTR/L [phone] 11/08/2012, 11:35 AM

## 2012-11-09 MED FILL — Sodium Chloride IV Soln 0.9%: INTRAVENOUS | Qty: 1000 | Status: AC

## 2012-11-09 MED FILL — Sodium Chloride Irrigation Soln 0.9%: Qty: 3000 | Status: AC

## 2012-11-09 MED FILL — Heparin Sodium (Porcine) Inj 1000 Unit/ML: INTRAMUSCULAR | Qty: 30 | Status: AC

## 2012-11-09 NOTE — Progress Notes (Signed)
Patient reports resolved left leg pain, + LBP, was up with therapy yesterday and doing well, oxycontin helping   BP 127/71  Pulse 120  Temp 98.1 F (36.7 C) (Oral)  Resp 20  Ht 5\' 5"  (1.651 m)  Wt 112.492 kg (248 lb)  BMI 41.27 kg/m2  SpO2 95%  LMP 08/31/2012 NVI Dressing CDI, neurovasculary intact  POD #2 after left 5/1 TLIF    - Cont up with PT with brace  - Cont percocet/valium/oxycontin, d/c on these meds - back precautions at all times  - patient would like to learn bathing/self care techniques today so she can go home - likely d/c tomorrow or Sunday per PT/OT

## 2012-11-09 NOTE — Progress Notes (Signed)
Seen and agreed 11/09/2012 Fredrich Birks PTA [phone] pager [phone] office

## 2012-11-09 NOTE — Progress Notes (Signed)
Physical Therapy Treatment Patient Details Name: Janice Wright MRN: [id number] DOB: 11/19/1985 Today's Date: 11/09/2012 Time: 4098-1191 PT Time Calculation (min): 11 min  PT Assessment / Plan / Recommendation Comments on Treatment Session  Upon entering room, patient had just got up into chair and put brace on with nursing therefore did not address bed mobility this session. Will address bed mobility next visit. Reviewed all precautions with patient.    Follow Up Recommendations        Does the patient have the potential to tolerate intense rehabilitation     Barriers to Discharge        Equipment Recommendations  Rolling walker with 5" wheels    Recommendations for Other Services    Frequency     Plan Discharge plan remains appropriate    Precautions / Restrictions Precautions Precautions: Back Spinal Brace: Applied in standing position;Thoracolumbosacral orthotic   Pertinent Vitals/Pain Patientt complain of soreness    Mobility  Transfers Sit to Stand: 4: Min guard;With upper extremity assist;From chair/3-in-1 Stand to Sit: 4: Min guard;With upper extremity assist;To chair/3-in-1 Ambulation/Gait Ambulation/Gait Assistance: 4: Min assist Ambulation Distance (Feet): 80 Feet Assistive device: Rolling walker Ambulation/Gait Assistance Details: Patient guarded with ambulation with RW for stability. Patient stated concern wanting  RW at home due to not feeling safe without it. Gait Pattern: Step-to pattern General Gait Details: Mild dizziness at ~40 feet. Pt able to turn around at walk back to room. Dizziness subsided with sitting.    Exercises     PT Diagnosis:    PT Problem List:   PT Treatment Interventions:     PT Goals Acute Rehab PT Goals PT Goal: Sit to Stand - Progress: Progressing toward goal PT Goal: Stand to Sit - Progress: Progressing toward goal PT Goal: Ambulate - Progress: Progressing toward goal  Visit Information  Last PT Received On:  11/09/12 Assistance Needed: +1    Subjective Data      Cognition  Overall Cognitive Status: Appears within functional limits for tasks assessed/performed Arousal/Alertness: Awake/alert Orientation Level: Appears intact for tasks assessed Behavior During Session: [facility] for tasks performed    Balance     End of Session PT - End of Session Equipment Utilized During Treatment: Gait belt;Back brace Activity Tolerance: Patient tolerated treatment well Patient left: in chair;with call bell/phone within reach   GP     Lazaro Arms 11/09/2012, 11:39 AM

## 2012-11-09 NOTE — Progress Notes (Signed)
Occupational Therapy Treatment Patient Details Name: Janice Wright MRN: [id number] DOB: October 31, 1986 Today's Date: 11/09/2012 Time: 4098-1191 OT Time Calculation (min): 32 min  OT Assessment / Plan / Recommendation Comments on Treatment Session Pt making progress and should continue with skilled OT services to restore PLOF to return home safely    Follow Up Recommendations  No OT follow up    Barriers to Discharge   None    Equipment Recommendations  3 in 1 bedside comode;Tub/shower seat;Other (comment) (hand held shower hose, bed rail, ADL A/E kit)    Recommendations for Other Services    Frequency Min 2X/week   Plan Discharge plan remains appropriate    Precautions / Restrictions Precautions Precautions: Back Precaution Booklet Issued: Yes (comment) Precaution Comments: Pt provided with handout for back precautions Required Braces or Orthoses: Spinal Brace Spinal Brace: Applied in standing position Spinal Brace Comments: Pt requires min A to donn and doff brace Restrictions Weight Bearing Restrictions: No       ADL  Grooming: Wash/dry hands;Wash/dry face;Set up;Supervision/safety Where Assessed - Grooming: Supported standing Where Assessed - Upper Body Bathing: Unsupported sitting Lower Body Bathing: Moderate assistance Where Assessed - Lower Body Bathing: Supported sitting;Supported sit to stand Lower Body Dressing: Moderate assistance Where Assessed - Lower Body Dressing: Unsupported sitting;Supported sit to stand Toilet Transfer: Performed;Minimal assistance Toilet Transfer Method: Sit to stand;Other (comment) (ambulating from RW level) Toilet Transfer Equipment: Raised toilet seat with arms (or 3-in-1 over toilet) Toileting - Clothing Manipulation and Hygiene: Minimal assistance Where Assessed - Toileting Clothing Manipulation and Hygiene: Standing ADL Comments: pt provided with ADL A/E education and demo for use at home    OT Diagnosis:    OT Problem List:   OT  Treatment Interventions:     OT Goals ADL Goals ADL Goal: Grooming - Progress: Progressing toward goals ADL Goal: Toilet Transfer - Progress: Progressing toward goals ADL Goal: Toileting - Clothing Manipulation - Progress: Progressing toward goals ADL Goal: Additional Goal #1 - Progress: Progressing toward goals ADL Goal: Additional Goal #2 - Progress: Progressing toward goals  Visit Information  Last OT Received On: 11/09/12 Assistance Needed: +1    Subjective Data  Subjective: " My husband, mom and sister will all help me at home " Patient Stated Goal: To return home   Prior Functioning       Cognition  Overall Cognitive Status: Appears within functional limits for tasks assessed/performed Arousal/Alertness: Awake/alert Orientation Level: Appears intact for tasks assessed Behavior During Session: [facility] for tasks performed    Mobility  Shoulder Instructions Transfers Transfers: Sit to Stand;Stand to Sit Sit to Stand: 4: Min guard;5: Supervision Stand to Sit: 4: Min guard;5: Supervision       Exercises      Balance Balance Balance Assessed: No   End of Session OT - End of Session Equipment Utilized During Treatment: Gait belt;Back brace;Other (comment) (3 in 1, RW, ADL A/E) Activity Tolerance: Patient limited by pain Patient left: in chair;with call bell/phone within reach  GO     Galen Manila 11/09/2012, 2:06 PM

## 2012-11-10 MED ORDER — OXYCODONE-ACETAMINOPHEN 5-325 MG PO TABS: 1.0000 | ORAL_TABLET | ORAL | Status: DC | PRN

## 2012-11-10 MED ORDER — DIAZEPAM 5 MG PO TABS: 5.0000 mg | ORAL_TABLET | Freq: Four times a day (QID) | ORAL | Status: DC | PRN

## 2012-11-10 NOTE — Progress Notes (Signed)
   DISCHARGE INSTRUCTIONS   SHOWERING You may shower as normal once the large, bulky dressing is removed from your incision. You may remove dressing 5 days after your surgery. Hair washing is permissible while in the shower. No tub baths, hot tubs or whirlpools until seen in the office.  EXERCISE You have unlimited walking and stair climbing privileges. Walking outside or walking on a treadmill (no incline) is also allowed. Do NOT lift anything weighing greater than 10 pounds. No bending, squating, stooping, twisting.  INCISION If any of the below should occur, please call the office. - Drainage from incisional site - Opening of incisions - Fevers greater than 101 F - Flu-like symptoms - Increased redness and/or tenderness  If you have staples or sutures (not tape) in your incision they may be removed 2 weeks following your surgery. This may be done by a visiting nurse, family physician or by making an appointment to come into the office.  SLEEPING You may sleep in any position which makes you comfortable. Many patients find comfort sleeping in a recliner chair. It is normal to have difficulty sleeping for the first several weeks following your surgery. We recommend trying Benadryl or Tylenol PM (both are over the counter drugs at the drugstore).  PAIN Do NOT take any anti-inflammatory medication (Advil, Aleve, Motrin) for the first ten weeks following your surgery.  To help alleviate persistent soreness, apply ice or warm moist compresses. The best thing is to keep walking.  DRIVING You may not drive a car until told otherwise by your physician. You may be a passenger for short distances (20-30 minutes). If you must take a longer trip, make sure to make several stops so that you can walk around and stretch your legs. Reclining the passenger seat seems to be the most comfortable position for most patients. It is illegal to drive a car while wearing a cervical orthosis.  FOLLOW-UP  APPOINTMENTS You will be given an appointment at approximately 2 weeks after your surgery.  CALL [phone] should any questions or concerns arise prior to your appointment.

## 2012-11-10 NOTE — Progress Notes (Signed)
Physical Therapy Treatment Patient Details Name: Janice Wright MRN: [id number] DOB: 02/28/1986 Today's Date: 11/10/2012 Time: 5784-6962 PT Time Calculation (min): 24 min  PT Assessment / Plan / Recommendation Comments on Treatment Session  Pt making great progress toward her goals with incr gait distance today and stair education completed. Pt required less cues/assistance with mobility as well.    Follow Up Recommendations  No PT follow up           Equipment Recommendations  Rolling walker with 5" wheels       Frequency Min 6X/week   Plan Discharge plan remains appropriate;Frequency remains appropriate    Precautions / Restrictions Precautions Precautions: Back Precaution Comments: Pt able to recall all back precautions without cues or assist. Required Braces or Orthoses: Spinal Brace Spinal Brace: Thoracolumbosacral orthotic;Applied in standing position Spinal Brace Comments: needs min assist to don/doff brace    Pertinent Vitals/Pain Reported pain as 7-8/10 before session, 10/10 after session. RN notified. Pt was premedicated. RN to continue to monitor pain and address as able.    Mobility  Bed Mobility Bed Mobility: Right Sidelying to Sit Right Sidelying to Sit: 5: Supervision;HOB flat;With rails Details for Bed Mobility Assistance: no cues or assistance needed Transfers Sit to Stand: 5: Supervision;From bed;From toilet;With upper extremity assist Stand to Sit: 5: Supervision;To chair/3-in-1;To toilet;With armrests Details for Transfer Assistance: no cues or assistance needed with transfers, incr time needed with all transfers Ambulation/Gait Ambulation/Gait Assistance: 5: Supervision Ambulation Distance (Feet): 200 Feet Assistive device: Rolling walker Ambulation/Gait Assistance Details: min vc's for posture and walker position with gait Gait Pattern: Step-through pattern;Decreased stride length;Decreased step length - right;Decreased step length -  left;Antalgic;Decreased stance time - left Gait velocity: decreased Stairs: Yes Stairs Assistance: 4: Min assist Stairs Assistance Details (indicate cue type and reason): cues for sequence (up with stronger leg first and down with weaker leg first). Pt unsure how steady both rails at home are so used one raill and HHA on opposite side for incr safety with stairs. Stair Management Technique: One rail Left;Forwards;Other (comment) (hand held assist on side opposite rail) Number of Stairs: 4      PT Goals Acute Rehab PT Goals PT Goal: Supine/Side to Sit - Progress: Progressing toward goal PT Goal: Sit to Stand - Progress: Progressing toward goal PT Goal: Stand to Sit - Progress: Progressing toward goal PT Goal: Ambulate - Progress: Progressing toward goal PT Goal: Up/Down Stairs - Progress: Met Additional Goals Additional Goal #1: Pt will state 3/3 back precautions (goal met)  Visit Information  Last PT Received On: 11/10/12 Assistance Needed: +1    Subjective Data  Subjective: No new complaints, agreeable to therapy at this time 'I'll try".   Cognition  Overall Cognitive Status: Appears within functional limits for tasks assessed/performed Arousal/Alertness: Awake/alert Orientation Level: Appears intact for tasks assessed Behavior During Session: [facility] for tasks performed       End of Session PT - End of Session Equipment Utilized During Treatment: Gait belt;Back brace Activity Tolerance: Patient tolerated treatment well Patient left: in chair;with call bell/phone within reach;with family/visitor present Nurse Communication: Mobility status   GP     Janice Wright 11/10/2012, 2:12 PM  Janice Wright, PTA Office- [phone]

## 2012-11-10 NOTE — Progress Notes (Signed)
Just spoke with patient. Her pain has substantially improved over the course of the day today. Leg pain has improved significantly since surgery. Plan is to d/c home this evening with percocet,  Valium and oxycontin, f/u in 2 weeks.

## 2012-11-10 NOTE — Progress Notes (Signed)
Patient reports  + LBP, a little more overnight was up with therapy yesterday and doing well, oxycontin helping   BP 126/81  Pulse 110  Temp 99.4 F (37.4 C) (Oral)  Resp 18  Ht 5\' 5"  (1.651 m)  Wt 112.492 kg (248 lb)  BMI 41.27 kg/m2  SpO2 97%  LMP 08/31/2012 NVI Dressing CDI, neurovasculary intact  POD #3 after left 5/1 TLIF    - Cont up with PT with brace  - Cont percocet/valium/oxycontin, d/c on these meds - back precautions at all times  - patient would like to learn bathing/self care techniques today so she can go home - likely d/c today if clears PT

## 2012-11-10 NOTE — Progress Notes (Signed)
Occupational Therapy Treatment Patient Details Name: Janice Wright MRN: [id number] DOB: 03/19/1986 Today's Date: 11/10/2012 Time: 4132-4401 OT Time Calculation (min): 37 min  OT Assessment / Plan / Recommendation Comments on Treatment Session Good progress. Reviewed all precautions. Pt will have adequate S for d/C home. Received all ned DME and given AE. Verbalizes back precautions. Able to independently instruct caregiver proper technique for donning back brace.    Follow Up Recommendations  No OT follow up    Barriers to Discharge   none    Equipment Recommendations  3 in 1 bedside comode;Tub/shower seat;Other (comment)    Recommendations for Other Services  none  Frequency Min 2X/week   Plan Discharge plan remains appropriate    Precautions / Restrictions Precautions Precautions: Back Precaution Booklet Issued: Yes (comment) Precaution Comments: Pt able to recall all back precautions without cues or assist. (however, pt not log rolling.) Required Braces or Orthoses: Spinal Brace Spinal Brace: Thoracolumbosacral orthotic;Applied in standing position Spinal Brace Comments:  (husband able to donn brace ) Restrictions Weight Bearing Restrictions: No   Pertinent Vitals/Pain 5. Pt nauseated. nsg aware. BP 132/80.     ADL  Toilet Transfer: Radiographer, therapeutic Method: Sit to Barista: Materials engineer and Hygiene: Supervision/safety Where Assessed - Engineer, mining and Hygiene: Sit to stand from 3-in-1 or toilet;Standing Transfers/Ambulation Related to ADLs: mod I ADL Comments: Reviewed back precautions. Educated pt/husband on use of tongs and wet wipes for hygiene after toileting. Reviewed AE.discussed importance of strictly following back precautions. Pt verbalized understanding. Able to demonstrate proper log rolling after education.   OT Diagnosis:    OT Problem List:   OT Treatment  Interventions:     OT Goals Acute Rehab OT Goals OT Goal Formulation: With patient Time For Goal Achievement: 11/22/12 Potential to Achieve Goals: Good ADL Goals Pt Will Perform Grooming: with supervision;Standing at sink ADL Goal: Grooming - Progress: Met Pt Will Transfer to Toilet: with supervision;Ambulation;with DME;Maintaining back safety precautions ADL Goal: Toilet Transfer - Progress: Met Pt Will Perform Toileting - Clothing Manipulation: with supervision;Sitting on 3-in-1 or toilet;Standing ADL Goal: Toileting - Clothing Manipulation - Progress: Met Pt Will Perform Toileting - Hygiene: with supervision;Sit to stand from 3-in-1/toilet ADL Goal: Toileting - Hygiene - Progress: Progressing toward goals Additional ADL Goal #1: Pt will complete all aspects of bathing and dressing with setup/min A using AE prn, maintaining all back safety precautions with min cues. ADL Goal: Additional Goal #1 - Progress: Progressing toward goals Additional ADL Goal #2: Family will  correctly don pt's TLSO with supervision and min cues. ADL Goal: Additional Goal #2 - Progress: Met  Visit Information  Last OT Received On: 11/10/12 Assistance Needed: +1    Subjective Data      Prior Functioning       Cognition  Overall Cognitive Status: Appears within functional limits for tasks assessed/performed Arousal/Alertness: Awake/alert Orientation Level: Appears intact for tasks assessed Behavior During Session: [facility] for tasks performed    Mobility  Shoulder Instructions Bed Mobility Bed Mobility: Rolling Right;Right Sidelying to Sit;Sitting - Scoot to Edge of Bed Rolling Right: 5: Supervision Right Sidelying to Sit: 5: Supervision;HOB flat Details for Bed Mobility Assistance:  (vc for log rolling) Transfers Transfers: Sit to Stand;Stand to Sit Sit to Stand: 5: Supervision;With upper extremity assist;From bed Stand to Sit: 5: Supervision;With upper extremity assist;To bed Details for Transfer  Assistance: no cues or assistance needed with transfers, incr time needed with all  transfers             Balance Balance Balance Assessed:  (WFL)   End of Session OT - End of Session Equipment Utilized During Treatment: Other (comment);Back brace Activity Tolerance: Patient tolerated treatment well Patient left: in chair;with family/visitor present;with call bell/phone within reach Nurse Communication: Mobility status;Other (comment) (ready for D/C)  GO     Lashon Hillier,HILLARY 11/10/2012, 5:53 PM [facility], OTR/L  [phone] 11/10/2012

## 2012-11-12 NOTE — Discharge Summary (Signed)
Patient ID: Janice Wright MRN: [id number] DOB/AGE: 06-07-1986 26 y.o.  Admit date: 11/07/2012 Discharge date: 11/11/12  Admission Diagnoses:L5 Radiculopathy  Discharge Diagnoses:  Improved s/p sx  Past Medical History  Diagnosis Date  . PONV (postoperative nausea and vomiting)     Surgeries: Procedure(s): Revision left-sided L5-S1, left-sided L5-S1 foraminal  Decompression, Left-sided L5-S1 transforaminal lumbar interbody fusion. Right-sided L5-S1 posterolateral fusion.  Discharged Condition: Improved  Hospital Course: Janice Wright is an 27 y.o. female who was admitted 11/07/2012 for operative treatment of left sided L5 Radiculopathy. Patient has severe unremitting pain that affects sleep, daily activities, and work/hobbies. After pre-op clearance the patient was taken to the operating room on 11/07/2012 and underwent  Procedure(s): L5-S1 revision, decompression and fusion.  Patient was given perioperative antibiotics: Anti-infectives     Start     Dose/Rate Route Frequency Ordered Stop   11/07/12 2300   ceFAZolin (ANCEF) IVPB 1 g/50 mL premix        1 g 100 mL/hr over 30 Minutes Intravenous Every 8 hours 11/07/12 2150 11/08/12 0720   11/07/12 0600   ceFAZolin (ANCEF) IVPB 2 g/50 mL premix        2 g 100 mL/hr over 30 Minutes Intravenous On call to O.R. 11/06/12 1453 11/07/12 1400           Patient was given sequential compression devices, early ambulation, and chemoprophylaxis to prevent DVT.  Patient benefited maximally from hospital stay and there were no complications.    Recent vital signs: BP 147/72  Pulse 124  Temp 98.4 F (36.9 C) (Oral)  Resp 18  Ht 5\' 5"  (1.651 m)  Wt 112.492 kg (248 lb)  BMI 41.27 kg/m2  SpO2 97%  LMP 08/31/2012   Discharge Medications:     Medication List     As of 11/12/2012 12:39 PM    STOP taking these medications         cyclobenzaprine 10 MG tablet   Commonly known as: FLEXERIL      ICY HOT EX      TAKE these medications           diazepam 5 MG tablet   Commonly known as: VALIUM   Take 1 tablet (5 mg total) by mouth every 6 (six) hours as needed.      oxyCODONE-acetaminophen 5-325 MG per tablet   Commonly known as: PERCOCET/ROXICET   Take 1-2 tablets by mouth every 4 (four) hours as needed.        Diagnostic Studies: Dg Chest 2 View  11/06/2012  *RADIOLOGY REPORT*  Clinical Data: Preoperative lumbar fusion  CHEST - 2 VIEW  Comparison: None.  Findings: Lungs clear.  Heart size and pulmonary vascularity are normal.  No adenopathy.  No bone lesions.  IMPRESSION: No abnormality noted.   Original Report Authenticated By: Bretta Bang, M.D.    Dg Lumbar Spine 2-3 Views  11/07/2012  *RADIOLOGY REPORT*  Clinical Data: Low back pain  DG C-ARM GT 120 MIN,LUMBAR SPINE - 2-3 VIEW  Comparison:  None.  Findings: C-arm films document L5-S1 PLIF.  No adverse features.  IMPRESSION: As above.   Original Report Authenticated By: Davonna Belling, M.D.    Dg Lumbar Spine 1 View  11/07/2012  *RADIOLOGY REPORT*  Clinical Data: Back pain  LUMBAR SPINE - 1 VIEW  Comparison: None.  Findings: Cross-table lateral view.  No localizer instruments or needle identified.  IMPRESSION: As above.   Original Report Authenticated By: Davonna Belling, M.D.    Dg C-arm  Gt 120 Min  11/07/2012  *RADIOLOGY REPORT*  Clinical Data: Low back pain  DG C-ARM GT 120 MIN,LUMBAR SPINE - 2-3 VIEW  Comparison:  None.  Findings: C-arm films document L5-S1 PLIF.  No adverse features.  IMPRESSION: As above.   Original Report Authenticated By: Davonna Belling, M.D.    Dg Outside Films Spine  11/07/2012  This examination belongs to an outside facility and is stored  here for comparison purposes only.  Contact the originating outside  institution for any associated report or interpretation.    Disposition: 01-Home or Self Care   D/c instructions and written prescription for percocet and Valium sent home with the pt.     Follow-up Information    Schedule an appointment as  soon as possible for a visit with [name], MD. (2 weeks postop)    Contact information:   [address] [place] [address] [phone]           Signed: Georga Bora 11/12/2012, 12:39 PM

## 2015-02-12 ENCOUNTER — Other Ambulatory Visit: Payer: Self-pay | Admitting: Orthopedic Surgery

## 2015-02-23 ENCOUNTER — Encounter (HOSPITAL_COMMUNITY)
Admission: RE | Admit: 2015-02-23 | Discharge: 2015-02-23 | Disposition: A | Discharge status: Home / Self Care | Payer: 59 | Source: Ambulatory Visit | Attending: Orthopedic Surgery | Admitting: Orthopedic Surgery

## 2015-02-23 ENCOUNTER — Encounter (HOSPITAL_COMMUNITY): Payer: Self-pay

## 2015-02-23 ENCOUNTER — Ambulatory Visit (HOSPITAL_COMMUNITY)
Admission: RE | Admit: 2015-02-23 | Discharge: 2015-02-23 | Disposition: A | Discharge status: Home / Self Care | Payer: 59 | Source: Ambulatory Visit | Attending: Orthopedic Surgery | Admitting: Orthopedic Surgery

## 2015-02-23 DIAGNOSIS — Z01818 Encounter for other preprocedural examination: Secondary | ICD-10-CM | POA: Insufficient documentation

## 2015-02-23 DIAGNOSIS — M79604 Pain in right leg: Secondary | ICD-10-CM | POA: Insufficient documentation

## 2015-02-23 HISTORY — DX: Drug induced constipation: K59.03

## 2015-02-23 HISTORY — DX: Adverse effect of other opioids, initial encounter: T40.2X5A

## 2015-02-23 HISTORY — DX: Polycystic ovarian syndrome: E28.2

## 2015-02-23 LAB — CBC WITH DIFFERENTIAL/PLATELET
Basophils Absolute: 0 10*3/uL (ref 0.0–0.1)
Basophils Relative: 1 % (ref 0–1)
Eosinophils Absolute: 0.2 10*3/uL (ref 0.0–0.7)
Eosinophils Relative: 2 % (ref 0–5)
HEMATOCRIT: 45.7 % (ref 36.0–46.0)
HEMOGLOBIN: 15.3 g/dL — AB (ref 12.0–15.0)
LYMPHS ABS: 3.5 10*3/uL (ref 0.7–4.0)
Lymphocytes Relative: 46 % (ref 12–46)
MCH: 29.5 pg (ref 26.0–34.0)
MCHC: 33.5 g/dL (ref 30.0–36.0)
MCV: 88.2 fL (ref 78.0–100.0)
Monocytes Absolute: 0.5 10*3/uL (ref 0.1–1.0)
Monocytes Relative: 7 % (ref 3–12)
NEUTROS PCT: 44 % (ref 43–77)
Neutro Abs: 3.3 10*3/uL (ref 1.7–7.7)
PLATELETS: 273 10*3/uL (ref 150–400)
RBC: 5.18 MIL/uL — ABNORMAL HIGH (ref 3.87–5.11)
RDW: 12.4 % (ref 11.5–15.5)
WBC: 7.5 10*3/uL (ref 4.0–10.5)

## 2015-02-23 LAB — URINE MICROSCOPIC-ADD ON

## 2015-02-23 LAB — COMPREHENSIVE METABOLIC PANEL
ALT: 195 U/L — ABNORMAL HIGH (ref 0–35)
AST: 120 U/L — ABNORMAL HIGH (ref 0–37)
Albumin: 3.8 g/dL (ref 3.5–5.2)
Alkaline Phosphatase: 99 U/L (ref 39–117)
Anion gap: 9 (ref 5–15)
BILIRUBIN TOTAL: 0.5 mg/dL (ref 0.3–1.2)
BUN: 6 mg/dL (ref 6–23)
CO2: 23 mmol/L (ref 19–32)
CREATININE: 0.64 mg/dL (ref 0.50–1.10)
Calcium: 9.5 mg/dL (ref 8.4–10.5)
Chloride: 102 mmol/L (ref 96–112)
Glucose, Bld: 427 mg/dL — ABNORMAL HIGH (ref 70–99)
Potassium: 4.1 mmol/L (ref 3.5–5.1)
SODIUM: 134 mmol/L — AB (ref 135–145)
TOTAL PROTEIN: 7.3 g/dL (ref 6.0–8.3)

## 2015-02-23 LAB — PROTIME-INR
INR: 0.95 (ref 0.00–1.49)
Prothrombin Time: 12.8 seconds (ref 11.6–15.2)

## 2015-02-23 LAB — APTT: aPTT: 27 seconds (ref 24–37)

## 2015-02-23 LAB — URINALYSIS, ROUTINE W REFLEX MICROSCOPIC
Bilirubin Urine: NEGATIVE
Hgb urine dipstick: NEGATIVE
Ketones, ur: NEGATIVE mg/dL
LEUKOCYTES UA: NEGATIVE
NITRITE: NEGATIVE
PROTEIN: NEGATIVE mg/dL
Specific Gravity, Urine: 1.043 — ABNORMAL HIGH (ref 1.005–1.030)
UROBILINOGEN UA: 0.2 mg/dL (ref 0.0–1.0)
pH: 5.5 (ref 5.0–8.0)

## 2015-02-23 LAB — TYPE AND SCREEN
ABO/RH(D): O POS
Antibody Screen: NEGATIVE

## 2015-02-23 LAB — SURGICAL PCR SCREEN
MRSA, PCR: NEGATIVE
Staphylococcus aureus: POSITIVE — AB

## 2015-02-23 LAB — HCG, SERUM, QUALITATIVE: Preg, Serum: NEGATIVE

## 2015-02-23 NOTE — Progress Notes (Signed)
[facility] [place]

## 2015-02-23 NOTE — Progress Notes (Signed)
Abnl labs noted. Blood glucose 427 and elevated liver enzymes [facility]. Albin FellingCarla will call patient to get her an appointment with PCP tomorrow. Needs surgical clearance.

## 2015-02-23 NOTE — Pre-Procedure Instructions (Signed)
Janice Wright  02/23/2015   Your procedure is scheduled on:  Wednesday ,May 4  Report to [facility] Admitting at 0630 AM.  Call this number if you have problems the morning of surgery: [phone]   Remember:   Do not eat food or drink liquids after midnight.Tuesday night  Take these medicines the morning of surgery with A SIP OF WATER: hydrocodone, if needed for pain   Do not wear jewelry, make-up or nail polish.  Do not wear lotions, powders, or perfumes. You may  Not wear deodorant.  Do not shave 48 hours prior to surgery.    Do not bring valuables to the hospital.  [facility] is not responsible   for any belongings or valuables.               Contacts, dentures or bridgework may not be worn into surgery.  Leave suitcase in the car. After surgery it may be brought to your room.  For patients admitted to the hospital, discharge time is determined by your   treatment team.                 Special Instructions: [place] - Preparing for Surgery  Before surgery, you can play an important role.  Because skin is not sterile, your skin needs to be as free of germs as possible.  You can reduce the number of germs on you skin by washing with CHG (chlorahexidine gluconate) soap before surgery.  CHG is an antiseptic cleaner which kills germs and bonds with the skin to continue killing germs even after washing.  Please DO NOT use if you have an allergy to CHG or antibacterial soaps.  If your skin becomes reddened/irritated stop using the CHG and inform your nurse when you arrive at Short Stay.  Do not shave (including legs and underarms) for at least 48 hours prior to the first CHG shower.  You may shave your face.  Please follow these instructions carefully:   1.  Shower with CHG Soap the night before surgery and the morning of Surgery.  2.  If you choose to wash your hair, wash your hair first as usual with your   normal shampoo.  3.  After you shampoo, rinse your hair and  body thoroughly to remove the   Shampoo.  4.  Use CHG as you would any other liquid soap.  You can apply chg directly   to the skin and wash gently with scrungie or a clean washcloth.  5.  Apply the CHG Soap to your body ONLY FROM THE NECK DOWN.  Do not use on open wounds or open sores.  Avoid contact with your eyes,  ears, mouth and genitals (private parts).  Wash genitals (private parts)    with your normal soap.  6.  Wash thoroughly, paying special attention to the area where your surgery   will be performed.  7.  Thoroughly rinse your body with warm water from the neck down.  8.  DO NOT shower/wash with your normal soap after using and rinsing off the CHG Soap.  9.  Pat yourself dry with a clean towel.            10.  Wear clean pajamas.            11.  Place clean sheets on your bed the night of your first shower and do not  sleep with pets.  Day of Surgery  Do not apply any lotions/deoderants  the morning of surgery.  Please wear clean clothes to the hospital/surgery center.      Please read over the following fact sheets that you were given: Pain Booklet, Coughing and Deep Breathing, Blood Transfusion Information and Surgical Site Infection Prevention

## 2015-02-23 NOTE — Progress Notes (Signed)
   02/23/15 1348  OBSTRUCTIVE SLEEP APNEA  Have you ever been diagnosed with sleep apnea through a sleep study? No  Do you snore loudly (loud enough to be heard through closed doors)?  0  Do you often feel tired, fatigued, or sleepy during the daytime? 0  Has anyone observed you stop breathing during your sleep? 0  BMI more than 35 kg/m2? 1  Age over 29 years old? 0  Neck circumference greater than 40 cm/16 inches? 1  Gender: 0

## 2015-03-23 ENCOUNTER — Encounter (HOSPITAL_COMMUNITY): Payer: Self-pay

## 2015-03-23 ENCOUNTER — Encounter (HOSPITAL_COMMUNITY)
Admission: RE | Admit: 2015-03-23 | Discharge: 2015-03-23 | Disposition: A | Discharge status: Home / Self Care | Payer: 59 | Source: Ambulatory Visit | Attending: Orthopedic Surgery | Admitting: Orthopedic Surgery

## 2015-03-23 DIAGNOSIS — Z01818 Encounter for other preprocedural examination: Secondary | ICD-10-CM | POA: Insufficient documentation

## 2015-03-23 DIAGNOSIS — M79604 Pain in right leg: Secondary | ICD-10-CM | POA: Insufficient documentation

## 2015-03-23 HISTORY — DX: Fatty (change of) liver, not elsewhere classified: K76.0

## 2015-03-23 HISTORY — DX: Major depressive disorder, single episode, unspecified: F32.9

## 2015-03-23 HISTORY — DX: Anxiety disorder, unspecified: F41.9

## 2015-03-23 LAB — SURGICAL PCR SCREEN
MRSA, PCR: NEGATIVE
STAPHYLOCOCCUS AUREUS: POSITIVE — AB

## 2015-03-23 LAB — HCG, SERUM, QUALITATIVE: Preg, Serum: NEGATIVE

## 2015-03-23 LAB — URINALYSIS, ROUTINE W REFLEX MICROSCOPIC
Bilirubin Urine: NEGATIVE
GLUCOSE, UA: NEGATIVE mg/dL
HGB URINE DIPSTICK: NEGATIVE
Ketones, ur: NEGATIVE mg/dL
LEUKOCYTES UA: NEGATIVE
Nitrite: NEGATIVE
PH: 6.5 (ref 5.0–8.0)
Protein, ur: NEGATIVE mg/dL
SPECIFIC GRAVITY, URINE: 1.012 (ref 1.005–1.030)
Urobilinogen, UA: 0.2 mg/dL (ref 0.0–1.0)

## 2015-03-23 LAB — PROTIME-INR
INR: 1.08 (ref 0.00–1.49)
Prothrombin Time: 14.2 seconds (ref 11.6–15.2)

## 2015-03-23 LAB — TYPE AND SCREEN
ABO/RH(D): O POS
Antibody Screen: NEGATIVE

## 2015-03-23 LAB — GLUCOSE, CAPILLARY: Glucose-Capillary: 154 mg/dL — ABNORMAL HIGH (ref 65–99)

## 2015-03-23 LAB — APTT: aPTT: 31 seconds (ref 24–37)

## 2015-03-23 NOTE — Pre-Procedure Instructions (Signed)
    Janice Wright  03/23/2015      [id number] [place] [place] - [address] [place] [id number] Phone: [phone] Fax: [phone]    Your procedure is scheduled on Wednesday April 01, 2015 at 8:30 AM.  [facility] Admitting at 6:30 A.M.  Call this number if you have problems the morning of surgery:  [phone]   Remember:  Do not eat food or drink liquids after midnight.  Take these medicines the morning of surgery with A SIP OF WATER : Hydrocdone if needed   Do NOT take any diabetic medications the morning of your surgery    Do not wear jewelry, make-up or nail polish.  Do not wear lotions, powders, or perfumes.    Do not shave 48 hours prior to surgery.   Do not bring valuables to the hospital.  [facility] is not responsible for any belongings or valuables.  Contacts, dentures or bridgework may not be worn into surgery.  Leave your suitcase in the car.  After surgery it may be brought to your room.  For patients admitted to the hospital, discharge time will be determined by your treatment team.  Patients discharged the day of surgery will not be allowed to drive home.   Name and phone number of your driver:    Special instructions:  Please shower using CHG soap the night before and the morning of your surgery  Please read over the following fact sheets that you were given. Pain Booklet, Coughing and Deep Breathing, Blood Transfusion Information, MRSA Information and Surgical Site Infection Prevention

## 2015-03-23 NOTE — Progress Notes (Signed)
[place] [facility] [place]. Patient denied having any acute cardiac or pulmonary issues at this time. Patient recently had lab work done on 03/19/15 under care everywhere tab, therefore only T&S, coags, urinalysis, HCG, and PCR were ordered during PAT visit.   Blood glucose was 154 upon arrival to PAT. Hemoglobin A1C was noted to be 10.1 under care everywhere tab. Patient informed Nurse that she had not eaten this morning, but did drink a cup of coffee before arrival to PAT.

## 2015-03-23 NOTE — Progress Notes (Signed)
Patient returned call and Nurse informed patient of positive PCR results. [facility]

## 2015-03-23 NOTE — Progress Notes (Signed)
Voicemail left instructing patient to return call ASAP about PCR results. Direct call back number left.

## 2015-03-24 NOTE — Progress Notes (Addendum)
Anesthesia Chart Review:  Pt is 29 year old female scheduled for L 4-5 revision posterior lumbar fusion on 04/01/2015 with Dr. Yevette Edwardsumonski.   [name] surgery originally scheduled for May 2016; when pt evaluated at PAT 02/23/2015, glucose found to be 427 and AST/ALT were elevated.  Medical clearance from PCP was requested prior to surgery.   PCP is Janice SellerKatherine Hemberg, NP (see care everywhere).   PMH includes: DM, fatty liver, PCOS. Never smoker. BMI 37. S/p PLIF 11/07/12.   Medications include: glipizide, lisinopril, metformin.   Preoperative labs reviewed.  CBC and CMET can be found in care everywhere dated 03/19/2015. AST/ALT have been elevated since 2013, typically mild elevations, but back in 2014 ALT was as high at 236 and AST was as high as 191.  On 03/19/2015, AST was 155, ALT was 184. Pt had abdominal US 05/07/2013 that demonstrated fatty liver (see care everywhere).   Chest x-ray 02/23/2015 reviewed. No active cardiopulmonary disease.   EKG 02/23/2015: NSR.   TEE 05/07/2013 (care everywhere): -LV EF is normal (55-60%). LV wall motion is normal. LV is normal in size.  -LA mildly dilated, RA normal.  -Mild AR, MR. Trace TR  Review LFTs with Dr. Jean RosenthalJackson.   If no changes, I anticipate pt can proceed with surgery as scheduled.   Rica Mastngela Kabbe, FNP-BC [facility]/Anesthesiology Phone: [phone] 03/24/2015 3:54 PM  Addendum: Medical clearance note from Janice SellerKatherine Hemberg, FNP received. She felt patient was at low risk for planned procedure.  Janice Ochsllison Dmarion Perfect, PA-C [facility]/Anesthesiology Phone [phone] 03/25/2015 9:25 AM

## 2015-03-31 MED ORDER — CEFAZOLIN SODIUM-DEXTROSE 2-3 GM-% IV SOLR
2.0000 g | INTRAVENOUS | Status: AC
  Administered 2015-04-01 (×2): 2 g via INTRAVENOUS
  Filled 2015-03-31: qty 50

## 2015-04-01 ENCOUNTER — Encounter (HOSPITAL_COMMUNITY)
Admission: RE | Disposition: A | Discharge status: Home / Self Care | Payer: Self-pay | Source: Ambulatory Visit | Attending: Orthopedic Surgery

## 2015-04-01 ENCOUNTER — Inpatient Hospital Stay (HOSPITAL_COMMUNITY): Payer: 59

## 2015-04-01 ENCOUNTER — Inpatient Hospital Stay (HOSPITAL_COMMUNITY)
Admission: RE | Admit: 2015-04-01 | Discharge: 2015-04-04 | DRG: 460 | Disposition: A | Discharge status: Home / Self Care | Payer: 59 | Source: Ambulatory Visit | Attending: Orthopedic Surgery | Admitting: Orthopedic Surgery

## 2015-04-01 ENCOUNTER — Inpatient Hospital Stay (HOSPITAL_COMMUNITY): Payer: 59 | Admitting: Anesthesiology

## 2015-04-01 ENCOUNTER — Encounter (HOSPITAL_COMMUNITY): Payer: Self-pay | Admitting: *Deleted

## 2015-04-01 ENCOUNTER — Inpatient Hospital Stay (HOSPITAL_COMMUNITY): Payer: 59 | Admitting: Vascular Surgery

## 2015-04-01 DIAGNOSIS — K760 Fatty (change of) liver, not elsewhere classified: Secondary | ICD-10-CM | POA: Diagnosis present

## 2015-04-01 DIAGNOSIS — I10 Essential (primary) hypertension: Secondary | ICD-10-CM | POA: Diagnosis present

## 2015-04-01 DIAGNOSIS — Z6837 Body mass index (BMI) 37.0-37.9, adult: Secondary | ICD-10-CM

## 2015-04-01 DIAGNOSIS — Z981 Arthrodesis status: Secondary | ICD-10-CM

## 2015-04-01 DIAGNOSIS — F419 Anxiety disorder, unspecified: Secondary | ICD-10-CM | POA: Diagnosis present

## 2015-04-01 DIAGNOSIS — E785 Hyperlipidemia, unspecified: Secondary | ICD-10-CM | POA: Diagnosis present

## 2015-04-01 DIAGNOSIS — Z79899 Other long term (current) drug therapy: Secondary | ICD-10-CM | POA: Diagnosis not present

## 2015-04-01 DIAGNOSIS — Z419 Encounter for procedure for purposes other than remedying health state, unspecified: Secondary | ICD-10-CM

## 2015-04-01 DIAGNOSIS — M5410 Radiculopathy, site unspecified: Secondary | ICD-10-CM | POA: Diagnosis present

## 2015-04-01 DIAGNOSIS — E282 Polycystic ovarian syndrome: Secondary | ICD-10-CM | POA: Diagnosis present

## 2015-04-01 DIAGNOSIS — E119 Type 2 diabetes mellitus without complications: Secondary | ICD-10-CM | POA: Diagnosis present

## 2015-04-01 DIAGNOSIS — M5116 Intervertebral disc disorders with radiculopathy, lumbar region: Secondary | ICD-10-CM | POA: Diagnosis present

## 2015-04-01 DIAGNOSIS — M79604 Pain in right leg: Secondary | ICD-10-CM | POA: Diagnosis present

## 2015-04-01 HISTORY — DX: Type 2 diabetes mellitus without complications: E11.9

## 2015-04-01 HISTORY — DX: Low back pain: M54.5

## 2015-04-01 HISTORY — DX: Other chronic pain: G89.29

## 2015-04-01 HISTORY — DX: Hyperlipidemia, unspecified: E78.5

## 2015-04-01 HISTORY — PX: POSTERIOR LUMBAR FUSION: SHX6036

## 2015-04-01 HISTORY — DX: Low back pain, unspecified: M54.50

## 2015-04-01 LAB — GLUCOSE, CAPILLARY
GLUCOSE-CAPILLARY: 102 mg/dL — AB (ref 65–99)
Glucose-Capillary: 197 mg/dL — ABNORMAL HIGH (ref 65–99)
Glucose-Capillary: 206 mg/dL — ABNORMAL HIGH (ref 65–99)

## 2015-04-01 SURGERY — POSTERIOR LUMBAR FUSION 1 LEVEL
Anesthesia: General | Site: Back | Laterality: Right

## 2015-04-01 MED ORDER — MORPHINE SULFATE 2 MG/ML IJ SOLN
INTRAMUSCULAR | Status: AC
  Administered 2015-04-01: 2 mg via INTRAVENOUS
  Filled 2015-04-01: qty 1

## 2015-04-01 MED ORDER — STERILE WATER FOR INJECTION IJ SOLN
INTRAMUSCULAR | Status: AC
  Filled 2015-04-01: qty 10

## 2015-04-01 MED ORDER — PROPOFOL 10 MG/ML IV BOLUS
INTRAVENOUS | Status: DC | PRN
  Administered 2015-04-01: 200 mg via INTRAVENOUS
  Administered 2015-04-01: 40 mg via INTRAVENOUS
  Administered 2015-04-01: 30 mg via INTRAVENOUS

## 2015-04-01 MED ORDER — METHYLENE BLUE 1 % INJ SOLN
INTRAMUSCULAR | Status: AC
  Filled 2015-04-01: qty 10

## 2015-04-01 MED ORDER — PROPOFOL 10 MG/ML IV BOLUS
INTRAVENOUS | Status: AC
  Filled 2015-04-01: qty 20

## 2015-04-01 MED ORDER — FENTANYL CITRATE (PF) 250 MCG/5ML IJ SOLN
INTRAMUSCULAR | Status: AC
  Filled 2015-04-01: qty 5

## 2015-04-01 MED ORDER — NALOXONE HCL 0.4 MG/ML IJ SOLN: 0.4000 mg | INTRAMUSCULAR | Status: DC | PRN

## 2015-04-01 MED ORDER — SODIUM CHLORIDE 0.9 % IJ SOLN
3.0000 mL | Freq: Two times a day (BID) | INTRAMUSCULAR | Status: DC
  Administered 2015-04-02 – 2015-04-03 (×3): 3 mL via INTRAVENOUS

## 2015-04-01 MED ORDER — DIAZEPAM 5 MG PO TABS
ORAL_TABLET | ORAL | Status: AC
  Filled 2015-04-01: qty 1

## 2015-04-01 MED ORDER — ONDANSETRON HCL 4 MG/2ML IJ SOLN
INTRAMUSCULAR | Status: DC | PRN
  Administered 2015-04-01 (×2): 4 mg via INTRAVENOUS

## 2015-04-01 MED ORDER — MIDAZOLAM HCL 5 MG/5ML IJ SOLN
INTRAMUSCULAR | Status: DC | PRN
  Administered 2015-04-01: 2 mg via INTRAVENOUS

## 2015-04-01 MED ORDER — BUPIVACAINE-EPINEPHRINE (PF) 0.25% -1:200000 IJ SOLN
INTRAMUSCULAR | Status: AC
  Filled 2015-04-01: qty 30

## 2015-04-01 MED ORDER — LISINOPRIL 2.5 MG PO TABS
2.5000 mg | ORAL_TABLET | Freq: Every day | ORAL | Status: DC
  Administered 2015-04-02 – 2015-04-04 (×3): 2.5 mg via ORAL
  Filled 2015-04-01 (×4): qty 1

## 2015-04-01 MED ORDER — PROMETHAZINE HCL 25 MG/ML IJ SOLN
INTRAMUSCULAR | Status: AC
  Filled 2015-04-01: qty 1

## 2015-04-01 MED ORDER — MENTHOL 3 MG MT LOZG: 1.0000 | LOZENGE | OROMUCOSAL | Status: DC | PRN

## 2015-04-01 MED ORDER — ONDANSETRON HCL 4 MG/2ML IJ SOLN: 4.0000 mg | Freq: Four times a day (QID) | INTRAMUSCULAR | Status: DC | PRN

## 2015-04-01 MED ORDER — SODIUM CHLORIDE 0.9 % IJ SOLN
INTRAMUSCULAR | Status: AC
  Filled 2015-04-01: qty 10

## 2015-04-01 MED ORDER — PROPOFOL INFUSION 10 MG/ML OPTIME
INTRAVENOUS | Status: DC | PRN
  Administered 2015-04-01: 50 ug/kg/min via INTRAVENOUS

## 2015-04-01 MED ORDER — DIAZEPAM 5 MG PO TABS
5.0000 mg | ORAL_TABLET | Freq: Four times a day (QID) | ORAL | Status: DC | PRN
  Administered 2015-04-01: 5 mg via ORAL

## 2015-04-01 MED ORDER — VECURONIUM BROMIDE 10 MG IV SOLR
INTRAVENOUS | Status: AC
  Filled 2015-04-01: qty 10

## 2015-04-01 MED ORDER — ZOLPIDEM TARTRATE 5 MG PO TABS: 5.0000 mg | ORAL_TABLET | Freq: Every evening | ORAL | Status: DC | PRN

## 2015-04-01 MED ORDER — HYDROMORPHONE HCL 1 MG/ML IJ SOLN
INTRAMUSCULAR | Status: AC
  Administered 2015-04-01: 0.5 mg via INTRAVENOUS
  Filled 2015-04-01: qty 1

## 2015-04-01 MED ORDER — INSULIN ASPART 100 UNIT/ML ~~LOC~~ SOLN
SUBCUTANEOUS | Status: AC
  Administered 2015-04-01: 3 [IU] via SUBCUTANEOUS
  Filled 2015-04-01: qty 3

## 2015-04-01 MED ORDER — THROMBIN 20000 UNITS EX SOLR
OROMUCOSAL | Status: DC | PRN
  Administered 2015-04-01: 09:00:00 via TOPICAL

## 2015-04-01 MED ORDER — DOCUSATE SODIUM 100 MG PO CAPS
200.0000 mg | ORAL_CAPSULE | Freq: Every day | ORAL | Status: DC | PRN
  Administered 2015-04-02 – 2015-04-03 (×2): 200 mg via ORAL
  Filled 2015-04-01 (×2): qty 2

## 2015-04-01 MED ORDER — LACTATED RINGERS IV SOLN
INTRAVENOUS | Status: DC | PRN
  Administered 2015-04-01 (×3): via INTRAVENOUS

## 2015-04-01 MED ORDER — DEXAMETHASONE SODIUM PHOSPHATE 4 MG/ML IJ SOLN
INTRAMUSCULAR | Status: AC
  Filled 2015-04-01: qty 2

## 2015-04-01 MED ORDER — SODIUM CHLORIDE 0.9 % IJ SOLN
9.0000 mL | INTRAMUSCULAR | Status: DC | PRN
Start: 2015-04-01 — End: 2015-04-02

## 2015-04-01 MED ORDER — DIPHENHYDRAMINE HCL 50 MG/ML IJ SOLN
INTRAMUSCULAR | Status: DC | PRN
  Administered 2015-04-01: 12.5 mg via INTRAVENOUS

## 2015-04-01 MED ORDER — NEOSTIGMINE METHYLSULFATE 10 MG/10ML IV SOLN
INTRAVENOUS | Status: DC | PRN
  Administered 2015-04-01: 2.5 mg via INTRAVENOUS

## 2015-04-01 MED ORDER — THROMBIN 20000 UNITS EX SOLR
CUTANEOUS | Status: AC
  Filled 2015-04-01: qty 20000

## 2015-04-01 MED ORDER — FLEET ENEMA 7-19 GM/118ML RE ENEM: 1.0000 | ENEMA | Freq: Once | RECTAL | Status: AC | PRN

## 2015-04-01 MED ORDER — SODIUM CHLORIDE 0.9 % IJ SOLN: 3.0000 mL | INTRAMUSCULAR | Status: DC | PRN

## 2015-04-01 MED ORDER — OXYCODONE-ACETAMINOPHEN 5-325 MG PO TABS
ORAL_TABLET | ORAL | Status: AC
  Filled 2015-04-01: qty 1

## 2015-04-01 MED ORDER — PHENYLEPHRINE HCL 10 MG/ML IJ SOLN
INTRAMUSCULAR | Status: DC | PRN
  Administered 2015-04-01 (×2): 80 ug via INTRAVENOUS

## 2015-04-01 MED ORDER — FENTANYL CITRATE (PF) 100 MCG/2ML IJ SOLN
INTRAMUSCULAR | Status: DC | PRN
  Administered 2015-04-01: 50 ug via INTRAVENOUS
  Administered 2015-04-01: 100 ug via INTRAVENOUS
  Administered 2015-04-01 (×5): 50 ug via INTRAVENOUS
  Administered 2015-04-01: 200 ug via INTRAVENOUS
  Administered 2015-04-01 (×3): 50 ug via INTRAVENOUS

## 2015-04-01 MED ORDER — DEXAMETHASONE SODIUM PHOSPHATE 4 MG/ML IJ SOLN
INTRAMUSCULAR | Status: DC | PRN
  Administered 2015-04-01: 8 mg via INTRAVENOUS

## 2015-04-01 MED ORDER — ACETAMINOPHEN 325 MG PO TABS: 650.0000 mg | ORAL_TABLET | ORAL | Status: DC | PRN

## 2015-04-01 MED ORDER — SCOPOLAMINE 1 MG/3DAYS TD PT72: 1.0000 | MEDICATED_PATCH | Freq: Once | TRANSDERMAL | Status: DC

## 2015-04-01 MED ORDER — MIDAZOLAM HCL 2 MG/2ML IJ SOLN
INTRAMUSCULAR | Status: AC
  Filled 2015-04-01: qty 2

## 2015-04-01 MED ORDER — CEFAZOLIN SODIUM-DEXTROSE 2-3 GM-% IV SOLR
INTRAVENOUS | Status: AC
  Filled 2015-04-01: qty 50

## 2015-04-01 MED ORDER — LIDOCAINE HCL (CARDIAC) 20 MG/ML IV SOLN
INTRAVENOUS | Status: DC | PRN
  Administered 2015-04-01: 70 mg via INTRAVENOUS
  Administered 2015-04-01: 30 mg via INTRAVENOUS

## 2015-04-01 MED ORDER — MORPHINE SULFATE (PF) 1 MG/ML IV SOLN
INTRAVENOUS | Status: DC
  Administered 2015-04-01: 22:00:00 via INTRAVENOUS
  Administered 2015-04-01: 24 mg via INTRAVENOUS
  Administered 2015-04-02: 14 mg via INTRAVENOUS
  Administered 2015-04-02: 1 mg via INTRAVENOUS
  Filled 2015-04-01 (×2): qty 25

## 2015-04-01 MED ORDER — ARTIFICIAL TEARS OP OINT
TOPICAL_OINTMENT | OPHTHALMIC | Status: DC | PRN
  Administered 2015-04-01: 1 via OPHTHALMIC

## 2015-04-01 MED ORDER — METFORMIN HCL 500 MG PO TABS
500.0000 mg | ORAL_TABLET | Freq: Two times a day (BID) | ORAL | Status: DC
  Administered 2015-04-02 – 2015-04-04 (×5): 500 mg via ORAL
  Filled 2015-04-01 (×5): qty 1

## 2015-04-01 MED ORDER — SUCCINYLCHOLINE CHLORIDE 20 MG/ML IJ SOLN
INTRAMUSCULAR | Status: DC | PRN
  Administered 2015-04-01: 120 mg via INTRAVENOUS

## 2015-04-01 MED ORDER — DIAZEPAM 5 MG PO TABS
5.0000 mg | ORAL_TABLET | Freq: Four times a day (QID) | ORAL | Status: DC | PRN
  Administered 2015-04-01 – 2015-04-04 (×8): 5 mg via ORAL
  Filled 2015-04-01 (×8): qty 1

## 2015-04-01 MED ORDER — GLIPIZIDE 5 MG PO TABS
5.0000 mg | ORAL_TABLET | Freq: Every day | ORAL | Status: DC
  Administered 2015-04-02 – 2015-04-04 (×3): 5 mg via ORAL
  Filled 2015-04-01 (×3): qty 1

## 2015-04-01 MED ORDER — MEPERIDINE HCL 25 MG/ML IJ SOLN: 6.2500 mg | INTRAMUSCULAR | Status: DC | PRN

## 2015-04-01 MED ORDER — MORPHINE SULFATE (PF) 1 MG/ML IV SOLN
INTRAVENOUS | Status: AC
  Administered 2015-04-01: 17:00:00
  Filled 2015-04-01: qty 25

## 2015-04-01 MED ORDER — DIPHENHYDRAMINE HCL 12.5 MG/5ML PO ELIX: 12.5000 mg | ORAL_SOLUTION | Freq: Four times a day (QID) | ORAL | Status: DC | PRN

## 2015-04-01 MED ORDER — SENNOSIDES-DOCUSATE SODIUM 8.6-50 MG PO TABS
1.0000 | ORAL_TABLET | Freq: Every evening | ORAL | Status: DC | PRN
  Administered 2015-04-03: 1 via ORAL
  Filled 2015-04-01: qty 1

## 2015-04-01 MED ORDER — MIDAZOLAM HCL 2 MG/2ML IJ SOLN: 0.5000 mg | Freq: Once | INTRAMUSCULAR | Status: DC | PRN

## 2015-04-01 MED ORDER — PHENYLEPHRINE 40 MCG/ML (10ML) SYRINGE FOR IV PUSH (FOR BLOOD PRESSURE SUPPORT)
PREFILLED_SYRINGE | INTRAVENOUS | Status: AC
  Filled 2015-04-01: qty 10

## 2015-04-01 MED ORDER — ONDANSETRON HCL 4 MG/2ML IJ SOLN
INTRAMUSCULAR | Status: AC
  Filled 2015-04-01: qty 2

## 2015-04-01 MED ORDER — DIPHENHYDRAMINE HCL 50 MG/ML IJ SOLN
INTRAMUSCULAR | Status: AC
  Filled 2015-04-01: qty 1

## 2015-04-01 MED ORDER — CEFAZOLIN SODIUM 1-5 GM-% IV SOLN
1.0000 g | Freq: Three times a day (TID) | INTRAVENOUS | Status: AC
  Administered 2015-04-01 – 2015-04-02 (×2): 1 g via INTRAVENOUS
  Filled 2015-04-01 (×2): qty 50

## 2015-04-01 MED ORDER — PHENOL 1.4 % MT LIQD: 1.0000 | OROMUCOSAL | Status: DC | PRN

## 2015-04-01 MED ORDER — LIDOCAINE HCL (CARDIAC) 20 MG/ML IV SOLN
INTRAVENOUS | Status: AC
  Filled 2015-04-01: qty 10

## 2015-04-01 MED ORDER — GLYCOPYRROLATE 0.2 MG/ML IJ SOLN
INTRAMUSCULAR | Status: DC | PRN
  Administered 2015-04-01: 0.3 mg via INTRAVENOUS

## 2015-04-01 MED ORDER — ROCURONIUM BROMIDE 50 MG/5ML IV SOLN
INTRAVENOUS | Status: AC
  Filled 2015-04-01: qty 1

## 2015-04-01 MED ORDER — SCOPOLAMINE 1 MG/3DAYS TD PT72
MEDICATED_PATCH | TRANSDERMAL | Status: AC
  Administered 2015-04-01: 1 via TRANSDERMAL
  Filled 2015-04-01: qty 1

## 2015-04-01 MED ORDER — ARTIFICIAL TEARS OP OINT
TOPICAL_OINTMENT | OPHTHALMIC | Status: AC
  Filled 2015-04-01: qty 3.5

## 2015-04-01 MED ORDER — HYDROMORPHONE HCL 1 MG/ML IJ SOLN
0.2500 mg | INTRAMUSCULAR | Status: DC | PRN
  Administered 2015-04-01 (×4): 0.5 mg via INTRAVENOUS

## 2015-04-01 MED ORDER — MORPHINE SULFATE 2 MG/ML IJ SOLN
1.0000 mg | INTRAMUSCULAR | Status: DC | PRN
  Administered 2015-04-01 (×2): 2 mg via INTRAVENOUS
  Administered 2015-04-02 (×2): 4 mg via INTRAVENOUS
  Administered 2015-04-03: 2 mg via INTRAVENOUS
  Administered 2015-04-03: 4 mg via INTRAVENOUS
  Administered 2015-04-04: 2 mg via INTRAVENOUS
  Filled 2015-04-01 (×3): qty 2
  Filled 2015-04-01 (×2): qty 1

## 2015-04-01 MED ORDER — SODIUM CHLORIDE 0.9 % IV SOLN
INTRAVENOUS | Status: DC
  Administered 2015-04-01: 75 mL/h via INTRAVENOUS
  Administered 2015-04-01: 23:00:00 via INTRAVENOUS

## 2015-04-01 MED ORDER — INSULIN ASPART 100 UNIT/ML ~~LOC~~ SOLN
0.0000 [IU] | Freq: Three times a day (TID) | SUBCUTANEOUS | Status: DC
  Administered 2015-04-01: 3 [IU] via SUBCUTANEOUS
  Administered 2015-04-02: 5 [IU] via SUBCUTANEOUS
  Administered 2015-04-02: 2 [IU] via SUBCUTANEOUS
  Administered 2015-04-03: 3 [IU] via SUBCUTANEOUS
  Administered 2015-04-03 (×2): 2 [IU] via SUBCUTANEOUS
  Administered 2015-04-04: 5 [IU] via SUBCUTANEOUS
  Filled 2015-04-01 (×22): qty 0.15

## 2015-04-01 MED ORDER — DOCUSATE SODIUM 50 MG PO CAPS: 250.0000 mg | ORAL_CAPSULE | Freq: Every day | ORAL | Status: DC | PRN

## 2015-04-01 MED ORDER — EPHEDRINE SULFATE 50 MG/ML IJ SOLN
INTRAMUSCULAR | Status: AC
  Filled 2015-04-01: qty 1

## 2015-04-01 MED ORDER — BUPIVACAINE-EPINEPHRINE 0.25% -1:200000 IJ SOLN
INTRAMUSCULAR | Status: DC | PRN
  Administered 2015-04-01: 26 mL

## 2015-04-01 MED ORDER — DIPHENHYDRAMINE HCL 50 MG/ML IJ SOLN: 12.5000 mg | Freq: Four times a day (QID) | INTRAMUSCULAR | Status: DC | PRN

## 2015-04-01 MED ORDER — PROMETHAZINE HCL 25 MG/ML IJ SOLN
6.2500 mg | INTRAMUSCULAR | Status: DC | PRN
  Administered 2015-04-01: 6.25 mg via INTRAVENOUS

## 2015-04-01 MED ORDER — BISACODYL 5 MG PO TBEC
5.0000 mg | DELAYED_RELEASE_TABLET | Freq: Every day | ORAL | Status: DC | PRN
  Administered 2015-04-02 – 2015-04-03 (×2): 5 mg via ORAL
  Filled 2015-04-01 (×2): qty 1

## 2015-04-01 MED ORDER — POLYETHYLENE GLYCOL 3350 17 G PO PACK
17.0000 g | PACK | Freq: Every day | ORAL | Status: DC | PRN
  Administered 2015-04-03: 17 g via ORAL
  Filled 2015-04-01 (×2): qty 1

## 2015-04-01 MED ORDER — 0.9 % SODIUM CHLORIDE (POUR BTL) OPTIME
TOPICAL | Status: DC | PRN
  Administered 2015-04-01 (×2): 1000 mL

## 2015-04-01 MED ORDER — SODIUM CHLORIDE 0.9 % IV SOLN
10.0000 mg | INTRAVENOUS | Status: DC | PRN
  Administered 2015-04-01: 15 ug/min via INTRAVENOUS

## 2015-04-01 MED ORDER — PNEUMOCOCCAL VAC POLYVALENT 25 MCG/0.5ML IJ INJ
0.5000 mL | INJECTION | INTRAMUSCULAR | Status: AC
  Administered 2015-04-03: 0.5 mL via INTRAMUSCULAR
  Filled 2015-04-01: qty 0.5

## 2015-04-01 MED ORDER — OXYCODONE-ACETAMINOPHEN 5-325 MG PO TABS
1.0000 | ORAL_TABLET | ORAL | Status: DC | PRN
  Administered 2015-04-01 (×2): 1 via ORAL

## 2015-04-01 MED ORDER — ALUM & MAG HYDROXIDE-SIMETH 200-200-20 MG/5ML PO SUSP: 30.0000 mL | Freq: Four times a day (QID) | ORAL | Status: DC | PRN

## 2015-04-01 MED ORDER — POVIDONE-IODINE 7.5 % EX SOLN
Freq: Once | CUTANEOUS | Status: DC
  Filled 2015-04-01: qty 118

## 2015-04-01 MED ORDER — VECURONIUM BROMIDE 10 MG IV SOLR
INTRAVENOUS | Status: DC | PRN
  Administered 2015-04-01: 8 mg via INTRAVENOUS

## 2015-04-01 MED ORDER — ACETAMINOPHEN 650 MG RE SUPP: 650.0000 mg | RECTAL | Status: DC | PRN

## 2015-04-01 MED ORDER — ONDANSETRON HCL 4 MG/2ML IJ SOLN
4.0000 mg | INTRAMUSCULAR | Status: DC | PRN
  Administered 2015-04-01 – 2015-04-02 (×2): 4 mg via INTRAVENOUS
  Filled 2015-04-01 (×2): qty 2

## 2015-04-01 MED ORDER — MORPHINE SULFATE 2 MG/ML IJ SOLN
INTRAMUSCULAR | Status: AC
  Filled 2015-04-01: qty 1

## 2015-04-01 MED ORDER — OXYCODONE-ACETAMINOPHEN 5-325 MG PO TABS
1.0000 | ORAL_TABLET | ORAL | Status: DC | PRN
  Administered 2015-04-01 – 2015-04-04 (×13): 2 via ORAL
  Filled 2015-04-01 (×13): qty 2

## 2015-04-01 MED ORDER — SODIUM CHLORIDE 0.9 % IV SOLN: 250.0000 mL | INTRAVENOUS | Status: DC

## 2015-04-01 SURGICAL SUPPLY — 89 items
BENZOIN TINCTURE PRP APPL 2/3 (GAUZE/BANDAGES/DRESSINGS) ×3 IMPLANT
BIT DRILL NEURO 2X3.1 SFT TUCH (MISCELLANEOUS) ×1 IMPLANT
BLADE SURG ROTATE 9660 (MISCELLANEOUS) IMPLANT
BUR PRESCISION 1.7 ELITE (BURR) IMPLANT
BUR ROUND PRECISION 4.0 (BURR) ×4 IMPLANT
BUR ROUND PRECISION 4.0MM (BURR) ×2
CAGE CONCORDE BULLET 9X8X27 (Cage) ×1 IMPLANT
CAGE CONCORDE BULLET 9X8X27MM (Cage) ×1 IMPLANT
CAGE SPNL PRLL BLT NOSE 27X9X8 (Cage) ×1 IMPLANT
CARTRIDGE OIL MAESTRO DRILL (MISCELLANEOUS) ×1 IMPLANT
CLOSURE STERI-STRIP 1/2X4 (GAUZE/BANDAGES/DRESSINGS) ×1
CLOSURE WOUND 1/2 X4 (GAUZE/BANDAGES/DRESSINGS) ×2
CLSR STERI-STRIP ANTIMIC 1/2X4 (GAUZE/BANDAGES/DRESSINGS) ×2 IMPLANT
CONT SPEC STER OR (MISCELLANEOUS) ×3 IMPLANT
COVER MAYO STAND STRL (DRAPES) ×6 IMPLANT
COVER SURGICAL LIGHT HANDLE (MISCELLANEOUS) ×3 IMPLANT
DIFFUSER DRILL AIR PNEUMATIC (MISCELLANEOUS) ×3 IMPLANT
DRAIN CHANNEL 15F RND FF W/TCR (WOUND CARE) IMPLANT
DRAPE C-ARM 42X72 X-RAY (DRAPES) ×6 IMPLANT
DRAPE C-ARMOR (DRAPES) ×3 IMPLANT
DRAPE POUCH INSTRU U-SHP 10X18 (DRAPES) ×3 IMPLANT
DRAPE SURG 17X23 STRL (DRAPES) ×12 IMPLANT
DRILL NEURO 2X3.1 SOFT TOUCH (MISCELLANEOUS) ×3
DURAPREP 26ML APPLICATOR (WOUND CARE) ×3 IMPLANT
ELECT BLADE 4.0 EZ CLEAN MEGAD (MISCELLANEOUS) ×3
ELECT CAUTERY BLADE 6.4 (BLADE) ×3 IMPLANT
ELECT REM PT RETURN 9FT ADLT (ELECTROSURGICAL) ×3
ELECTRODE BLDE 4.0 EZ CLN MEGD (MISCELLANEOUS) ×1 IMPLANT
ELECTRODE REM PT RTRN 9FT ADLT (ELECTROSURGICAL) ×1 IMPLANT
EVACUATOR SILICONE 100CC (DRAIN) IMPLANT
GAUZE SPONGE 4X4 12PLY STRL (GAUZE/BANDAGES/DRESSINGS) ×3 IMPLANT
GAUZE SPONGE 4X4 16PLY XRAY LF (GAUZE/BANDAGES/DRESSINGS) ×12 IMPLANT
GLOVE BIO SURGEON STRL SZ7 (GLOVE) ×3 IMPLANT
GLOVE BIO SURGEON STRL SZ7.5 (GLOVE) ×12 IMPLANT
GLOVE BIO SURGEON STRL SZ8 (GLOVE) ×3 IMPLANT
GLOVE BIOGEL PI IND STRL 7.0 (GLOVE) ×1 IMPLANT
GLOVE BIOGEL PI IND STRL 8 (GLOVE) ×1 IMPLANT
GLOVE BIOGEL PI INDICATOR 7.0 (GLOVE) ×2
GLOVE BIOGEL PI INDICATOR 8 (GLOVE) ×2
GOWN STRL REUS W/ TWL LRG LVL3 (GOWN DISPOSABLE) ×2 IMPLANT
GOWN STRL REUS W/ TWL XL LVL3 (GOWN DISPOSABLE) ×1 IMPLANT
GOWN STRL REUS W/TWL LRG LVL3 (GOWN DISPOSABLE) ×4
GOWN STRL REUS W/TWL XL LVL3 (GOWN DISPOSABLE) ×2
IV CATH 14GX2 1/4 (CATHETERS) ×3 IMPLANT
KIT BASIN OR (CUSTOM PROCEDURE TRAY) ×3 IMPLANT
KIT POSITION SURG JACKSON T1 (MISCELLANEOUS) ×3 IMPLANT
KIT ROOM TURNOVER OR (KITS) ×3 IMPLANT
MARKER SKIN DUAL TIP RULER LAB (MISCELLANEOUS) ×3 IMPLANT
MILL MEDIUM DISP (BLADE) ×3 IMPLANT
MIX DBX 10CC 35% BONE (Bone Implant) ×3 IMPLANT
NDL SAFETY ECLIPSE 18X1.5 (NEEDLE) ×1 IMPLANT
NEEDLE 22X1 1/2 (OR ONLY) (NEEDLE) ×3 IMPLANT
NEEDLE BONE MARROW 8GX6 FENEST (NEEDLE) IMPLANT
NEEDLE HYPO 18GX1.5 SHARP (NEEDLE) ×2
NEEDLE HYPO 25GX1X1/2 BEV (NEEDLE) ×3 IMPLANT
NEEDLE SPNL 18GX3.5 QUINCKE PK (NEEDLE) ×6 IMPLANT
NEURO MONITORING STIM (LABOR (TRAVEL & OVERTIME)) ×3 IMPLANT
NS IRRIG 1000ML POUR BTL (IV SOLUTION) ×3 IMPLANT
OIL CARTRIDGE MAESTRO DRILL (MISCELLANEOUS) ×3
PACK LAMINECTOMY ORTHO (CUSTOM PROCEDURE TRAY) ×3 IMPLANT
PACK UNIVERSAL I (CUSTOM PROCEDURE TRAY) ×3 IMPLANT
PAD ARMBOARD 7.5X6 YLW CONV (MISCELLANEOUS) ×6 IMPLANT
PATTIES SURGICAL .5 X1 (DISPOSABLE) ×3 IMPLANT
PATTIES SURGICAL .5X1.5 (GAUZE/BANDAGES/DRESSINGS) ×3 IMPLANT
PENCIL BUTTON HOLSTER BLD 10FT (ELECTRODE) ×3 IMPLANT
ROD EXPEDIUM PER BENT 65MM (Rod) ×6 IMPLANT
SCREW EXPEDIUM POLYAXIAL 7X40M (Screw) ×6 IMPLANT
SCREW POLY EXPEDIUM 5.5 8X45 (Screw) ×6 IMPLANT
SCREW SET SINGLE INNER (Screw) ×18 IMPLANT
SPONGE INTESTINAL PEANUT (DISPOSABLE) ×3 IMPLANT
SPONGE SURGIFOAM ABS GEL 100 (HEMOSTASIS) ×3 IMPLANT
STRIP CLOSURE SKIN 1/2X4 (GAUZE/BANDAGES/DRESSINGS) ×4 IMPLANT
SURGIFLO TRUKIT (HEMOSTASIS) IMPLANT
SUT BONE WAX W31G (SUTURE) ×3 IMPLANT
SUT MNCRL AB 4-0 PS2 18 (SUTURE) ×6 IMPLANT
SUT VIC AB 0 CT1 18XCR BRD 8 (SUTURE) ×1 IMPLANT
SUT VIC AB 0 CT1 8-18 (SUTURE) ×2
SUT VIC AB 1 CT1 18XCR BRD 8 (SUTURE) ×2 IMPLANT
SUT VIC AB 1 CT1 8-18 (SUTURE) ×4
SUT VIC AB 2-0 CT2 18 VCP726D (SUTURE) ×3 IMPLANT
SYR 20CC LL (SYRINGE) ×3 IMPLANT
SYR BULB IRRIGATION 50ML (SYRINGE) ×3 IMPLANT
SYR CONTROL 10ML LL (SYRINGE) ×6 IMPLANT
SYR TB 1ML LUER SLIP (SYRINGE) ×3 IMPLANT
TOWEL OR 17X24 6PK STRL BLUE (TOWEL DISPOSABLE) ×3 IMPLANT
TOWEL OR 17X26 10 PK STRL BLUE (TOWEL DISPOSABLE) ×3 IMPLANT
TRAY FOLEY CATH 16FRSI W/METER (SET/KITS/TRAYS/PACK) ×3 IMPLANT
WATER STERILE IRR 1000ML POUR (IV SOLUTION) ×3 IMPLANT
YANKAUER SUCT BULB TIP NO VENT (SUCTIONS) ×3 IMPLANT

## 2015-04-01 NOTE — H&P (Signed)
PREOPERATIVE H&P  Chief Complaint: Right leg pain  HPI: Janice Wright is a 29 y.o. 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 Patient is s/p previous L5/S1 decompression.  Patient has failed multiple forms of conservative care and continues to have pain (see office notes for additional details regarding the patient's full course of treatment)  Past Medical History  Diagnosis Date  . PONV (postoperative nausea and vomiting)   . Diabetes mellitus without complication 2016    Type 2 NIDDm x 2 years  . Constipation due to opioid therapy   . History of depression 2014    resolved  . Polycystic ovarian syndrome   . Anxiety   . Depression   . Fatty liver    Past Surgical History  Procedure Laterality Date  . Knee arthroscopy      left  . Cesarean section    . Cholecystectomy    . Wisdom tooth extraction    . Posterior lumbar fusion  10/2012  . Back surgery      x 2   History   Social History  . Marital Status: Married    Spouse Name: N/A  . Number of Children: N/A  . Years of Education: N/A   Social History Main Topics  . Smoking status: Never Smoker   . Smokeless tobacco: Never Used  . Alcohol Use: No  . Drug Use: No  . Sexual Activity: Yes    Birth Control/ Protection: None   Other Topics Concern  . None   Social History Narrative   History reviewed. No pertinent family history. No Known Allergies Prior to Admission medications   Medication Sig Start Date End Date Taking? Authorizing Provider  cyclobenzaprine (FLEXERIL) 10 MG tablet Take 10 mg by mouth at bedtime. 01/28/15  Yes Historical Provider, MD  docusate sodium (COLACE) 250 MG capsule Take 250 mg by mouth daily as needed for constipation.   Yes Historical Provider, MD  glipiZIDE (GLUCOTROL) 5 MG tablet Take 5 mg by mouth daily before breakfast.   Yes Historical Provider, MD  HYDROcodone-acetaminophen (NORCO) 10-325 MG per tablet Take 1  tablet by mouth every 4 (four) hours as needed. 02/09/15  Yes Historical Provider, MD  lisinopril (PRINIVIL,ZESTRIL) 2.5 MG tablet Take 2.5 mg by mouth daily.   Yes Historical Provider, MD  metFORMIN (GLUCOPHAGE) 500 MG tablet Take 500 mg by mouth 2 (two) times daily with a meal.    Yes Historical Provider, MD  methocarbamol (ROBAXIN) 500 MG tablet Take 500 mg by mouth 2 (two) times daily as needed for muscle spasms.  02/09/15  Yes Historical Provider, MD  polyethylene glycol (MIRALAX / GLYCOLAX) packet Take 17 g by mouth daily as needed for severe constipation.   Yes Historical Provider, MD  diazepam (VALIUM) 5 MG tablet Take 1 tablet (5 mg total) by mouth every 6 (six) hours as needed. Patient not taking: Reported on 02/19/2015 11/10/12   Mack Hookavid Thompson, MD  oxyCODONE-acetaminophen (PERCOCET/ROXICET) 5-325 MG per tablet Take 1-2 tablets by mouth every 4 (four) hours as needed. Patient not taking: Reported on 02/19/2015 11/10/12   Mack Hookavid Thompson, MD     All other systems have been reviewed and were otherwise negative with the exception of those mentioned in the HPI and as above.  Physical Exam: Filed Vitals:   04/01/15 0646  BP: 106/61  Pulse: 85  Temp: 97.2 F (36.2 C)  Resp: 18  General: Alert, no acute distress Cardiovascular: No pedal edema Respiratory: No cyanosis, no use of accessory musculature Skin: No lesions in the area of chief complaint Neurologic: Sensation intact distally Psychiatric: Patient is competent for consent with normal mood and affect Lymphatic: No axillary or cervical lymphadenopathy  MUSCULOSKELETAL: + SLR on R  Assessment/Plan: Right leg pain Plan for Procedure(s): REVISION POSTERIOR LUMBAR FUSION 1 LEVEL   [name], MD 04/01/2015 8:13 AM

## 2015-04-01 NOTE — Anesthesia Postprocedure Evaluation (Signed)
  Anesthesia Post-op Note  Patient: Janice Wright  Procedure(s) Performed: Procedure(s) with comments: REVISION POSTERIOR LUMBAR FUSION 1 LEVEL (Right) - Revision right sided lumbar 4-5 Transforaminal lumbar interbody fusion with instrumentation and allograft  Patient Location: PACU  Anesthesia Type:General  Level of Consciousness: awake, alert , oriented and patient cooperative  Airway and Oxygen Therapy: Patient Spontanous Breathing and Patient connected to nasal cannula oxygen  Post-op Pain: mild  Post-op Assessment: Post-op Vital signs reviewed, Patient's Cardiovascular Status Stable, Respiratory Function Stable, Patent Airway, No signs of Nausea or vomiting and Pain level controlled  Post-op Vital Signs: Reviewed and stable  Last Vitals:  Filed Vitals:   04/01/15 1636  BP:   Pulse:   Temp:   Resp: 20    Complications: No apparent anesthesia complications

## 2015-04-01 NOTE — Anesthesia Preprocedure Evaluation (Signed)
Anesthesia Evaluation  Patient identified by MRN, date of birth, ID band Patient awake    Reviewed: Allergy & Precautions, NPO status , Patient's Chart, lab work & pertinent test results  History of Anesthesia Complications (+) PONV and history of anesthetic complications  Airway Mallampati: II  TM Distance: >3 FB Neck ROM: Full    Dental  (+) Dental Advisory Given   Pulmonary neg pulmonary ROS,  breath sounds clear to auscultation        Cardiovascular hypertension, Pt. on medications - anginaRhythm:Regular Rate:Normal     Neuro/Psych Chronic back pain: narcotics daily    GI/Hepatic negative GI ROS, Neg liver ROS,   Endo/Other  diabetes (glu 102), Oral Hypoglycemic AgentsMorbid obesity  Renal/GU negative Renal ROS     Musculoskeletal   Abdominal (+) + obese,   Peds  Hematology negative hematology ROS (+)   Anesthesia Other Findings   Reproductive/Obstetrics Polycystic ovarian 03/23/15 preg test: NEG                             Anesthesia Physical Anesthesia Plan  ASA: III  Anesthesia Plan: General   Post-op Pain Management:    Induction: Intravenous  Airway Management Planned: Oral ETT  Additional Equipment:   Intra-op Plan:   Post-operative Plan: Extubation in OR  Informed Consent: I have reviewed the patients History and Physical, chart, labs and discussed the procedure including the risks, benefits and alternatives for the proposed anesthesia with the patient or authorized representative who has indicated his/her understanding and acceptance.   Dental advisory given  Plan Discussed with: CRNA and Surgeon  Anesthesia Plan Comments: (Plan routine monitors, GETA)        Anesthesia Quick Evaluation

## 2015-04-01 NOTE — Anesthesia Procedure Notes (Signed)
Procedure Name: Intubation Date/Time: 04/01/2015 8:33 AM Performed by: Wray KearnsFOLEY, Janice Mcdaniel A Pre-anesthesia Checklist: Patient identified, Timeout performed, Emergency Drugs available, Suction available and Patient being monitored Patient Re-evaluated:Patient Re-evaluated prior to inductionOxygen Delivery Method: Circle system utilized Preoxygenation: Pre-oxygenation with 100% oxygen Intubation Type: IV induction and Cricoid Pressure applied Ventilation: Mask ventilation without difficulty Laryngoscope Size: Mac and 4 Grade View: Grade I Tube type: Oral Tube size: 7.5 mm Number of attempts: 1 Airway Equipment and Method: Stylet Placement Confirmation: ETT inserted through vocal cords under direct vision,  breath sounds checked- equal and bilateral and positive ETCO2 Secured at: 22 cm Tube secured with: Tape Dental Injury: Teeth and Oropharynx as per pre-operative assessment

## 2015-04-01 NOTE — Transfer of Care (Signed)
Immediate Anesthesia Transfer of Care Note  Patient: Janice Wright  Procedure(s) Performed: Procedure(s) with comments: REVISION POSTERIOR LUMBAR FUSION 1 LEVEL (Right) - Revision right sided lumbar 4-5 Transforaminal lumbar interbody fusion with instrumentation and allograft  Patient Location: PACU  Anesthesia Type:General  Level of Consciousness: awake, oriented, sedated, patient cooperative and responds to stimulation  Airway & Oxygen Therapy: Patient Spontanous Breathing and Patient connected to nasal cannula oxygen  Post-op Assessment: Report given to RN, Post -op Vital signs reviewed and stable, Patient moving all extremities and Patient moving all extremities X 4  Post vital signs: Reviewed and stable  Last Vitals:  Filed Vitals:   04/01/15 1310  BP:   Pulse:   Temp: 36.7 C  Resp:     Complications: No apparent anesthesia complications

## 2015-04-02 LAB — GLUCOSE, CAPILLARY
GLUCOSE-CAPILLARY: 128 mg/dL — AB (ref 65–99)
GLUCOSE-CAPILLARY: 232 mg/dL — AB (ref 65–99)
Glucose-Capillary: 150 mg/dL — ABNORMAL HIGH (ref 65–99)

## 2015-04-02 MED ORDER — OXYCODONE HCL ER 10 MG PO T12A
10.0000 mg | EXTENDED_RELEASE_TABLET | Freq: Two times a day (BID) | ORAL | Status: DC
  Administered 2015-04-02 – 2015-04-04 (×5): 10 mg via ORAL
  Filled 2015-04-02 (×5): qty 1

## 2015-04-02 MED FILL — Sodium Chloride IV Soln 0.9%: INTRAVENOUS | Qty: 1000 | Status: AC

## 2015-04-02 MED FILL — Heparin Sodium (Porcine) Inj 1000 Unit/ML: INTRAMUSCULAR | Qty: 30 | Status: AC

## 2015-04-02 NOTE — Evaluation (Signed)
Occupational Therapy Evaluation Patient Details Name: Janice Wright MRN: [id number] DOB: 1986-08-05 Today's Date: 04/02/2015    History of Present Illness Pt admitted for L4- L5 lumbar fusion revision.   Clinical Impression   Pt currently min guard assist for functional transfers using the RW for support.  She was able to state 2/3 back precautions initially as well.  Pain controlled during session at 5 or less however pt very sleepy by end of session.  Feel she will benefit from one more acute OT visit for education on AE/DME as well as practice with functional mobility for shower transfers.  No post acute OT recommended.    Follow Up Recommendations  No OT follow up    Equipment Recommendations  None recommended by OT       Precautions / Restrictions Precautions Precautions: Back Precaution Booklet Issued: Yes (comment) Precaution Comments: Pt able to state 2/3 initially Required Braces or Orthoses: Spinal Brace Spinal Brace: Thoracolumbosacral orthotic;Applied in sitting position Restrictions Weight Bearing Restrictions: No      Mobility Bed Mobility               General bed mobility comments: Pt sitting EOB so did not attempt bed mobility this session  Transfers Overall transfer level: Needs assistance Equipment used: Rolling walker (2 wheeled) Transfers: Sit to/from UGI CorporationStand;Stand Pivot Transfers Sit to Stand: Min guard Stand pivot transfers: Min guard       General transfer comment: Min instructional cueing for hand placement during sit to stand transitions.     Balance Overall balance assessment: Needs assistance   Sitting balance-Leahy Scale: Good       Standing balance-Leahy Scale: Fair                              ADL Overall ADL's : Needs assistance/impaired Eating/Feeding: Independent;Sitting   Grooming: Wash/dry hands;Wash/dry face;Min guard;Standing   Upper Body Bathing: Supervision/ safety;Sitting   Lower Body Bathing:  Minimal assistance;Sit to/from stand   Upper Body Dressing : Supervision/safety;Sitting   Lower Body Dressing: With adaptive equipment;Adhering to back precautions;Minimal assistance;Sit to/from stand   Toilet Transfer: Supervision/safety;Ambulation;Comfort height toilet;Grab bars;RW   Toileting- Clothing Manipulation and Hygiene: Sit to/from stand;Minimal assistance       Functional mobility during ADLs: Min guard;Rolling walker General ADL Comments: Pt educated on AE use for LB selfcare tasks.  She demonstrates decreased ability to perform perianal hygiene after toileting.  Feel she will benefit from education on toilet aide as well.       Vision Vision Assessment?: No apparent visual deficits   Perception Perception Perception Tested?: No   Praxis Praxis Praxis tested?: Not tested    Pertinent Vitals/Pain Pain Assessment: 0-10 Pain Score: 3  Pain Location: left knee Pain Intervention(s): Repositioned     Hand Dominance Right   Extremity/Trunk Assessment Upper Extremity Assessment Upper Extremity Assessment: Overall WFL for tasks assessed (strength not formally tested secondary to back precautions but WFLs for ADL tasks)   Lower Extremity Assessment Lower Extremity Assessment: Defer to PT evaluation   Cervical / Trunk Assessment Cervical / Trunk Assessment: Normal   Communication Communication Communication: No difficulties   Cognition Arousal/Alertness: Awake/alert Behavior During Therapy: WFL for tasks assessed/performed Overall Cognitive Status: Within Functional Limits for tasks assessed                                Home Living  Family/patient expects to be discharged to:: Private residence Living Arrangements: Spouse/significant other;Children   Type of Home: Mobile home Home Access: Stairs to enter     Home Layout: One level     Bathroom Shower/Tub: Producer, television/film/video: Standard Bathroom Accessibility: Yes How  Accessible: Accessible via walker Home Equipment: Bedside commode;Walker - 2 wheels;Other (comment);Shower seat Lexicographer)          Prior Functioning/Environment Level of Independence: Independent             OT Diagnosis: Generalized weakness;Acute pain   OT Problem List: Decreased strength;Decreased activity tolerance;Impaired balance (sitting and/or standing);Pain;Decreased knowledge of use of DME or AE;Decreased knowledge of precautions   OT Treatment/Interventions: Self-care/ADL training;Patient/family education;Balance training;Therapeutic activities;DME and/or AE instruction    OT Goals(Current goals can be found in the care plan section) Acute Rehab OT Goals Patient Stated Goal: Pt wants to go home as soon as possible. OT Goal Formulation: With patient/family Time For Goal Achievement: 04/09/15 Potential to Achieve Goals: Good  OT Frequency: Min 2X/week              End of Session Equipment Utilized During Treatment: Rolling walker;Back brace Nurse Communication: Mobility status  Activity Tolerance: Patient tolerated treatment well Patient left: in chair;with call bell/phone within reach   Time: 0854-0949 OT Time Calculation (min): 55 min Charges:  OT General Charges $OT Visit: 1 Procedure OT Evaluation $Initial OT Evaluation Tier I: 1 Procedure OT Treatments $Self Care/Home Management : 38-52 mins  Nylah Butkus OTR/L 04/02/2015, 10:03 AM

## 2015-04-02 NOTE — Plan of Care (Signed)
Problem: Consults Goal: Diagnosis - Spinal Surgery Thoraco/Lumbar Spine Fusion  revision

## 2015-04-02 NOTE — Progress Notes (Addendum)
DC  Morphine PCA, waste 6mg  morphine from BD syringe in sink. Witnessed by Briant SitesSylvia Howell, charge RN. Will continue to assess pain.    1120: Oozing noted on surgical dressing sangious, top gauze saturated. Reinforced with folded 4x4 and medipore tape. MD notified-office staff. At 1150 will continue to monitor.

## 2015-04-02 NOTE — Op Note (Signed)
NAMDarel Hong:  STACEY, Lakenzie               ACCOUNT NO.:  [id number]  MEDICAL RECORD NO.:  [id number]  LOCATION:  [id number]                        FACILITY:  [facility]  PHYSICIAN:  Estill BambergMark Jceon Alverio, MD      DATE OF BIRTH:  October 17, 1986  DATE OF PROCEDURE:  04/01/2015                              OPERATIVE REPORT   PREOPERATIVE DIAGNOSES: 1. Recurrent right L4-L5 disk herniation. 2. Status post previous L5-S1 fusion. 3. Status post previous L4-L5 decompression.  POSTOPERATIVE DIAGNOSES: 1. Recurrent right L4-L5 disk herniation. 2. Status post previous L5-S1 fusion. 3. Status post previous L4-L5 decompression.  PROCEDURE: 1. Revision decompression, L4-L5 with removal of significant scar     tissue and adhesions between the traversing L5 nerve and the disk     herniation and bone circumferential to it. 2. Right-sided L4-L5 transforaminal lumbar interbody fusion. 3. Left-sided L4-L5 posterolateral fusion. 4. Insertion of interbody device x1 (8 x 27 Concorde Bullet cage). 5. Placement of posterior segmental instrumentation L4, L5, and S1. 6. Use of local autograft. 7. Use of morselized allograft-DBX mix. 8. Intraoperative use of fluoroscopy. 9. Removal and reinsertion of spinal fixation, L5 (7 x 45 mm screws     were removed and an 8 x 45 mm screws were placed).  SURGEON:  Estill BambergMark Levy Cedano, MD  ASSISTANT:  Jason CoopKayla McKenzie, PA-C  ANESTHESIA:  General endotracheal anesthesia.  COMPLICATIONS:  None.  DISPOSITION:  Stable.  ESTIMATED BLOOD LOSS:  100 mL.  INDICATIONS FOR SURGERY:  Briefly, Ms. Janice Wright is a very pleasant 29 year old female, who is status post a previous L4-L5 decompression, and a previous L5-S1 fusion.  The patient did very well from those procedures, however, did go on to have recurrence of pain in her right leg.  The pain was noted to be rather severe and debilitating.  An MRI did reveal a recurrent L4-L5 herniation, clearly compressing the L5 nerve on the right.  Given the  patient's ongoing pain and lack of improvement with multiple conservative treatment measures, we did discuss proceeding with the procedure reflected above.  OPERATIVE DETAILS:  On April 01, 2015, the patient was brought to surgery and general endotracheal anesthesia was administered.  The patient was placed prone on a well-padded flat Jackson bed with a spinal frame. Antibiotics were given.  All bony prominences were padded.  A time-out procedure was performed.  After prepping and draping the back, a midline incision was made in line with the patient's previous incision.  The fascia was incised at the midline.  The paraspinal musculature was bluntly swept laterally.  The caps were identified bilaterally at L5 and at S1 and were removed uneventfully.  The interconnecting rods were also removed.  I then tested the screws and the bilateral L5 screws were noted to be slightly loose.  The screws were then removed.  On the left, the screw was up sized to an 8 x 45 mm screw.  The L4 pedicle was then cannulated on the left.  I did decorticate the posterolateral gutter on the left using a high-speed bur.  A 7 x 40 mm screw was then placed on the left and appropriate the sized interconnecting rod was placed, 55 mm in length.  The DBX mix was then packed into the posterolateral gutter. After placing the rod, caps were placed and provisionally tightened.  On the right side, I did cannulate the L4 pedicle and bone wax was placed in its place.  I then performed a full facetectomy on the right.  Of note, there was abundant scar tissue noted in the epidural space on the right at L4-L5 consistent with the patient's known previous decompression.  It did take a significant amount of time to release the adhesions between the dura and the bone around it.  I was however able to develop a plane and perform a full facetectomy.  The L5 nerve was identified and noted to be under tension.  I was able to retract  the nerves medially.  A disk protrusion was noted immediately beneath it.  I then proceeded with a thorough and complete decompression, removing the disk protrusion and the entire facet joint.  In doing so, I was able to adequately decompress the L5 nerve on the right.  I then performed a full and thorough intervertebral diskectomy in the usual manner.  The endplates were then prepared.  The intervertebral space was then packed with autograft that was obtained from the decompression, in combination with the DBX mix.  An 8 x 27 mm spacer was also packed with autograft and allograft and tamped into position in the usual fashion.  I did use AP and lateral fluoroscopy to confirm appropriate positioning of the intervertebral implant.  Prior to placing the bone graft, I did copiously irrigate the wound with approximately 3 L of normal saline.  I then reinserted a pedicle screw at L5 on the right, using an 8 x 45 mm screw.  I then placed a 7 x 40 mm screw at L4.  Again, a 65 mm interconnecting rod was placed and caps were also placed.  Compression was applied across the L4-L5 intervertebral space.  Caps were placed and a final locking procedure was performed on both the right and on the left sides.  I was very pleased with the final AP and lateral fluoroscopic imaging.  There was no extravasation of cerebrospinal fluid.  All bleeding was controlled at the termination of the procedure. There was no abnormal EMG activity noted throughout the entire surgery. The wound was then closed in layers using #1 Vicryl followed by 2-0 Vicryl, followed by 3-0 Monocryl.  Benzoin and Steri-Strips were applied, followed by sterile dressing.  All instrument counts were correct.  Of note, Jason Coop was my assistant throughout the surgery, and did aid in retraction, suctioning, and closure.     [name], MD     MD/MEDQ  D:  04/01/2015  T:  04/02/2015  Job:  [id number]

## 2015-04-02 NOTE — Progress Notes (Signed)
    Patient doing well Right leg pain is resolved + expected LBP   Physical Exam: Filed Vitals:   04/02/15 0536  BP: 112/58  Pulse: 101  Temp: 98.3 F (36.8 C)  Resp: 20    Dressing in place NVI  POD #1 s/p revision L4/5 decompression and L4/5 fusion  - up with PT/OT, encourage ambulation - Percocet for pain, Valium for muscle spasms, will also add oxycontin 10 BID while in hospital - likely d/c home Friday vs Saturday

## 2015-04-02 NOTE — Evaluation (Signed)
Physical Therapy Evaluation Patient Details Name: Janice Wright MRN: [id number] DOB: 23-May-1986 Today's Date: 04/02/2015   History of Present Illness  Pt is a 29 y/o F s/p revision post lumbar fusion.  [name] PMH includes previous L5/S1 decompression, DM, depression, anxiety, L knee arthroscopy.  Clinical Impression  Patient is s/p above surgery resulting in functional limitations due to the deficits listed below (see PT Problem List). Patient needs to practice stairs next session.  Pt will likely use RW upon d/c but will attempt ambulation w/ cane at next session to assess.  Pt w/ onset of RLE weakness after ambulating 40 ft this session.  Patient will benefit from skilled PT to increase their independence and safety with mobility to allow discharge to the venue listed below.      Follow Up Recommendations Outpatient PT;Supervision for mobility/OOB    Equipment Recommendations  None recommended by PT    Recommendations for Other Services       Precautions / Restrictions Precautions Precautions: Back Precaution Booklet Issued: Yes (comment) Precaution Comments: Pt able to recall all back precautions Required Braces or Orthoses: Spinal Brace Spinal Brace: Thoracolumbosacral orthotic;Applied in sitting position Restrictions Weight Bearing Restrictions: No      Mobility  Bed Mobility Overal bed mobility: Needs Assistance Bed Mobility: Rolling;Sidelying to Sit Rolling: Supervision Sidelying to sit: Supervision       General bed mobility comments: Supervision and verbal cues for proper log roll technique (min verbal cues required as pt has experience w/ log roll).    Transfers Overall transfer level: Needs assistance Equipment used: Rolling walker (2 wheeled) Transfers: Sit to/from Stand Sit to Stand: Min guard         General transfer comment: Min guard for safety.  Pt stood from elevated bed as pt has elevated bed at home.  Ambulation/Gait Ambulation/Gait assistance:  Min guard Ambulation Distance (Feet): 40 Feet Assistive device: Rolling walker (2 wheeled) Gait Pattern/deviations: Step-through pattern;Decreased stride length;Decreased step length - left;Decreased step length - right;Shuffle;Antalgic   Gait velocity interpretation: Below normal speed for age/gender General Gait Details: Very dec stride length 2/2 muscle guarding and pt's fear of causing pain in low back.  Pt standing upright.  Pt reports sensation of weakness in RLE after ambulating 40 ft and recliner chair provided for pt to sit.  Stairs            Wheelchair Mobility    Modified Rankin (Stroke Patients Only)       Balance Overall balance assessment: Needs assistance Sitting-balance support: Bilateral upper extremity supported;Feet supported Sitting balance-Leahy Scale: Good     Standing balance support: Bilateral upper extremity supported;During functional activity Standing balance-Leahy Scale: Fair                               Pertinent Vitals/Pain Pain Assessment: 0-10 Pain Score: 7  Pain Location: back Pain Descriptors / Indicators: Aching;Discomfort;Grimacing;Guarding Pain Intervention(s): Limited activity within patient's tolerance;Monitored during session;Repositioned    Home Living Family/patient expects to be discharged to:: Private residence Living Arrangements: Spouse/significant other;Children (54 year old daughter) Available Help at Discharge: Family;Available PRN/intermittently (Grandmother, mom, husband) Type of Home: Mobile home Home Access: Stairs to enter Entrance Stairs-Rails: Can reach both Entrance Stairs-Number of Steps: 4 Home Layout: One level Home Equipment: Walker - 2 wheels;Bedside commode Additional Comments: cousin who is physical therapist who she is hoping to get PT from once she is d/c    Prior Function  Level of Independence: Independent with assistive device(s)         Comments: cane prn if weakness in RLE; has  been receiving assist w/ socks/shoes on/off, showering, dressing as she has muscle spasms in her back when she bends over and has been experiencing RLE weakness     Hand Dominance   Dominant Hand: Right    Extremity/Trunk Assessment   Upper Extremity Assessment: Defer to OT evaluation           Lower Extremity Assessment: LLE deficits/detail;RLE deficits/detail RLE Deficits / Details: DF/PF, hip Abd/Add: 5/5, knee F/E: 4/5 hip F: 3/5 LLE Deficits / Details: DF and hip F: 3/5, knee F/E, hip Abd/Add: 4/5  Cervical / Trunk Assessment: Other exceptions  Communication   Communication: No difficulties  Cognition Arousal/Alertness: Awake/alert Behavior During Therapy: WFL for tasks assessed/performed Overall Cognitive Status: Within Functional Limits for tasks assessed                      General Comments General comments (skin integrity, edema, etc.): [name] husband will be making platform to place at side of bed since bed is high at home.    Exercises        Assessment/Plan    PT Assessment Patient needs continued PT services  PT Diagnosis Difficulty walking;Abnormality of gait;Generalized weakness;Acute pain   PT Problem List Decreased strength;Decreased range of motion;Decreased activity tolerance;Decreased balance;Decreased mobility;Decreased coordination;Decreased knowledge of use of DME;Decreased safety awareness;Decreased knowledge of precautions;Impaired sensation;Decreased skin integrity;Pain  PT Treatment Interventions DME instruction;Gait training;Stair training;Functional mobility training;Therapeutic activities;Therapeutic exercise;Balance training;Neuromuscular re-education;Patient/family education;Modalities   PT Goals (Current goals can be found in the Care Plan section) Acute Rehab PT Goals Patient Stated Goal: none stated PT Goal Formulation: With patient/family Time For Goal Achievement: 04/09/15 Potential to Achieve Goals: Good    Frequency Min  5X/week   Barriers to discharge Inaccessible home environment;Decreased caregiver support Intermittent assist and 4 steps to enter home    Co-evaluation               End of Session Equipment Utilized During Treatment: Back brace Activity Tolerance: Patient tolerated treatment well Patient left: in chair;with call bell/phone within reach;with family/visitor present Nurse Communication: Mobility status;Precautions         Time: 1529-1600 PT Time Calculation (min) (ACUTE ONLY): 31 min   Charges:   PT Evaluation $Initial PT Evaluation Tier I: 1 Procedure PT Treatments $Gait Training: 8-22 mins   PT G CodesMichail Jewels:       Danilynn Parr PT, DPT [phone] Pager: [phone] 04/02/2015, 4:26 PM

## 2015-04-03 LAB — GLUCOSE, CAPILLARY
GLUCOSE-CAPILLARY: 186 mg/dL — AB (ref 65–99)
GLUCOSE-CAPILLARY: 123 mg/dL — AB (ref 65–99)
GLUCOSE-CAPILLARY: 252 mg/dL — AB (ref 65–99)
Glucose-Capillary: 149 mg/dL — ABNORMAL HIGH (ref 65–99)
Glucose-Capillary: 139 mg/dL — ABNORMAL HIGH (ref 65–99)

## 2015-04-03 NOTE — Progress Notes (Signed)
    Patient doing well PO Day 2, improved pain today, good progress in PT/OT yesterday, tolerating PO intake well, NL B/B function, pleased with progress, parents at bedside. Pre-op pain resolved in leg   Physical Exam: BP 118/64 mmHg  Pulse 105  Temp(Src) 98.9 F (37.2 C) (Oral)  Resp 18  Ht 5\' 5"  (1.651 m)  Wt 100.699 kg (222 lb)  BMI 36.94 kg/m2  SpO2 92%  LMP 03/04/2015  Dressing in place, has been reinforced, no active oozing, sitting up comfortably in chair, TLSO at bedside, 2+ DPP,  NVI  POD #2 s/p L4-5 Revision Decompression and Fusion  - Pre-op leg px resolved, expected PO LBPP well controlled - up with PT/OT, encourage ambulation - Percocet for pain, Valium for muscle spasms - Oxycontin while in hospital - likely d/c home today vs tomorrow pending PT/OT progress

## 2015-04-03 NOTE — Progress Notes (Signed)
Occupational Therapy Treatment Patient Details Name: Janice Wright MRN: [id number] DOB: 01-31-1986 Today's Date: 04/03/2015    History of present illness Pt is a 29 y/o F s/p revision post lumbar fusion.  [name] PMH includes previous L5/S1 decompression, DM, depression, anxiety, L knee arthroscopy.   OT comments  Completed education regarding compensatory techniques and use of AE for ADL and functional mobility for ADL. Pt making good progress. Feel pt will be appropriate for D/C tomorrow. All OT goals met. OT signing off.   Follow Up Recommendations  No OT follow up    Equipment Recommendations  None recommended by OT    Recommendations for Other Services      Precautions / Restrictions Precautions Precautions: Back Precaution Comments: Pt able to recall all back precautions Required Braces or Orthoses: Spinal Brace Spinal Brace: Thoracolumbosacral orthotic;Applied in sitting position Restrictions Weight Bearing Restrictions: No       Mobility Bed Mobility Overal bed mobility: Modified Independent Bed Mobility: Rolling;Sidelying to Sit Rolling: Modified independent (Device/Increase time) Sidelying to sit: Modified independent (Device/Increase time)       General bed mobility comments: Pt getting OOB with HOB elevated. Reviewed log rolling techniques   Transfers Overall transfer level: Needs assistance Equipment used: Rolling walker (2 wheeled) Transfers: Sit to/from [facility] Sit to Stand: Supervision Stand pivot transfers: Supervision       General transfer comment: cues for hand placement at times    Balance Overall balance assessment: Needs assistance Sitting-balance support: Bilateral upper extremity supported;Feet supported Sitting balance-Leahy Scale: Good     Standing balance support: Bilateral upper extremity supported;During functional activity Standing balance-Leahy Scale: Fair Standing balance comment: Pt able to maintain balance  while adjusting brace in standing                   ADL                                       Functional mobility during ADLs: Supervision/safety;Rolling walker General ADL Comments: Completed education regarding use of AE for LB ADL and hygiene after toileting. Educated on use of elastixc laces as an option. completed shower stall transfer wtih bench with S for technique. Pt able to return demonstrate. Independent with donning/doffing TLSO                                      Cognition   Behavior During Therapy: WFL for tasks assessed/performed Overall Cognitive Status: Within Functional Limits for tasks assessed                                                 General Comments      Pertinent Vitals/ Pain       Pain Assessment: 0-10 Pain Score: 5  Pain Location: back Pain Descriptors / Indicators: Burning Pain Intervention(s): Limited activity within patient's tolerance;Monitored during session  Home Living                                          Prior Functioning/Environment  Frequency       Progress Toward Goals  OT Goals(current goals can now be found in the care plan section)  Progress towards OT goals: Goals met/education completed, patient discharged from OT  Acute Rehab OT Goals Patient Stated Goal: to go home tomorrow OT Goal Formulation: With patient/family Time For Goal Achievement: 04/09/15 Potential to Achieve Goals: Good ADL Goals Pt Will Perform Grooming: with modified independence;standing Pt Will Perform Lower Body Dressing: with supervision;sit to/from stand;with adaptive equipment Pt Will Perform Toileting - Clothing Manipulation and hygiene: with modified independence;with adaptive equipment;sit to/from stand Pt Will Perform Tub/Shower Transfer: with supervision;Shower transfer;shower seat;ambulating;rolling walker Additional ADL Goal #1: Pt will transfer  supine to sit EOB with supervision adhering to back precautions, in preparation for selfcare tasks.   Plan Discharge plan remains appropriate    Co-evaluation                 End of Session Equipment Utilized During Treatment: Gait belt;Rolling walker;Back brace   Activity Tolerance Patient tolerated treatment well   Patient Left in bed;with call bell/phone within reach;with family/visitor present   Nurse Communication Mobility status        Time: 8377-9396 OT Time Calculation (min): 30 min  Charges: OT General Charges $OT Visit: 1 Procedure OT Treatments $Self Care/Home Management : 23-37 mins  Natasa Stigall,HILLARY 04/03/2015, 3:33 PM   [facility], OTR/L  [phone] 04/03/2015

## 2015-04-03 NOTE — Progress Notes (Signed)
Physical Therapy Treatment Patient Details Name: Janice Wright MRN: [id number] DOB: June 06, 1986 Today's Date: 04/03/2015    History of Present Illness Pt is a 29 y/o F s/p revision post lumbar fusion.  [name] PMH includes previous L5/S1 decompression, DM, depression, anxiety, L knee arthroscopy.    PT Comments    Pt demonstrated ability to ambulate 80 ft and ascend/descend 4 steps this session.  Pt denied any weakness in BLEs during ambulation this session, an improvement from last session.  Pt desires to stay one more night as she would like to become more comfortable w/ ambulation.  Pt encourage to ambulate in room and hallway w/ assist of RN or nurse tech.  Pt anticipating d/c to home tomorrow.   Follow Up Recommendations  Outpatient PT;Supervision for mobility/OOB     Equipment Recommendations  None recommended by PT    Recommendations for Other Services       Precautions / Restrictions Precautions Precautions: Back Precaution Comments: Pt able to recall all back precautions Required Braces or Orthoses: Spinal Brace Spinal Brace: Thoracolumbosacral orthotic;Applied in sitting position Restrictions Weight Bearing Restrictions: No    Mobility  Bed Mobility Overal bed mobility: Modified Independent Bed Mobility: Rolling;Sidelying to Sit Rolling: Modified independent (Device/Increase time) Sidelying to sit: Modified independent (Device/Increase time)       General bed mobility comments: Mod independent w/ min use of bed rails.  Good carryover of log roll technique from last session.  Transfers Overall transfer level: Needs assistance Equipment used: Rolling walker (2 wheeled) Transfers: Sit to/from Stand Sit to Stand: Supervision         General transfer comment: Supervision for safety.  Ambulation/Gait Ambulation/Gait assistance: Supervision Ambulation Distance (Feet): 80 Feet Assistive device: Rolling walker (2 wheeled) Gait Pattern/deviations: Step-through  pattern;Shuffle;Antalgic;Decreased stride length   Gait velocity interpretation: Below normal speed for age/gender General Gait Details: Very dec stride length 2/2 muscle guarding 2/2 pain.  Pt denied any weakness in BLEs.   Stairs Stairs: Yes Stairs assistance: Min guard Stair Management: No rails;Forwards;Step to pattern Number of Stairs: 2 (x2) General stair comments: Demonstration and verbal cues for technique.  Wheelchair Mobility    Modified Rankin (Stroke Patients Only)       Balance Overall balance assessment: Needs assistance Sitting-balance support: Bilateral upper extremity supported;Feet supported Sitting balance-Leahy Scale: Good     Standing balance support: Bilateral upper extremity supported;During functional activity Standing balance-Leahy Scale: Fair Standing balance comment: Pt able to maintain balance while adjusting brace in sitting                    Cognition Arousal/Alertness: Awake/alert Behavior During Therapy: WFL for tasks assessed/performed Overall Cognitive Status: Within Functional Limits for tasks assessed                      Exercises      General Comments        Pertinent Vitals/Pain Pain Assessment: 0-10 Pain Score: 4  Pain Location: low back Pain Descriptors / Indicators: Aching Pain Intervention(s): Limited activity within patient's tolerance;Monitored during session;Repositioned    Home Living                      Prior Function            PT Goals (current goals can now be found in the care plan section) Acute Rehab PT Goals Patient Stated Goal: to go home tomorrow Progress towards PT goals: Progressing toward goals  Frequency  Min 5X/week    PT Plan Current plan remains appropriate    Co-evaluation             End of Session Equipment Utilized During Treatment: Back brace Activity Tolerance: Patient tolerated treatment well Patient left: in chair;with call bell/phone within  reach;with family/visitor present     Time: 1610-9604 PT Time Calculation (min) (ACUTE ONLY): 26 min  Charges:  $Gait Training: 23-37 mins                    G CodesKammie Wright PT, [place] [phone] Pager: [phone] 04/03/2015, 12:00 PM

## 2015-04-04 LAB — GLUCOSE, CAPILLARY
Glucose-Capillary: 223 mg/dL — ABNORMAL HIGH (ref 65–99)
Glucose-Capillary: 125 mg/dL — ABNORMAL HIGH (ref 65–99)

## 2015-04-04 MED ORDER — OXYCODONE HCL ER 20 MG PO T12A: 20.0000 mg | EXTENDED_RELEASE_TABLET | Freq: Two times a day (BID) | ORAL | Status: DC

## 2015-04-04 NOTE — Progress Notes (Signed)
Subjective: 3 Days Post-Op Procedure(s) (LRB): REVISION POSTERIOR LUMBAR FUSION 1 LEVEL (Right) Patient reports pain as moderate. Denies leg pain or numbness and tingling. Does complain of moderate low back pain. She is progressing well with therapy. She is ready to go home.  Objective: Vital signs in last 24 hours: Temp:  [98.8 F (37.1 C)-99.4 F (37.4 C)] 98.8 F (37.1 C) (06/04 0605) Pulse Rate:  [108-123] 108 (06/04 0605) Resp:  [17-18] 18 (06/04 0605) BP: (108-127)/(69-76) 108/69 mmHg (06/04 0605) SpO2:  [94 %-96 %] 94 % (06/04 0605)  Intake/Output from previous day: 06/03 0701 - 06/04 0700 In: 480 [P.O.:480] Out: -  Intake/Output this shift:    Lumbar spine exam: Her lumbar dressing is clean and dry. No redness or swelling. Lower extremities showed no motor or sensory deficits. Her calves are soft bilaterally and nontender. She has 5/5 ankle plantar and dorsi flexor strength   Assessment/Plan: 3 Days Post-Op Procedure(s) (LRB): REVISION POSTERIOR LUMBAR FUSION 1 LEVEL (Right) Plan: Discharge home today. She will follow-up with Dr. Yevette Edwardsumonski in 2 weeks. She will wear her back brace when out of bed. At the patient's request I will give her an Rx for OxyContin 20 mg twice daily 2 weeks.  Britaney Espaillat G 04/04/2015, 8:36 AM

## 2015-04-15 NOTE — Discharge Summary (Signed)
Patient ID: Janice Wright MRN: [id number] DOB/AGE: 1986-10-14 28 y.o.  Admit date: 04/01/2015 Discharge date: 04/04/2015 Admission Diagnoses:  Active Problems:   Radiculopathy   Discharge Diagnoses:  Same  Past Medical History  Diagnosis Date  . PONV (postoperative nausea and vomiting)   . Constipation due to opioid therapy   . Polycystic ovarian syndrome   . Anxiety   . Fatty liver   . Depression 2014    hx; "resolved"  . Hyperlipidemia   . Type II diabetes mellitus dx'd 2014  . Chronic lower back pain     Surgeries: Procedure(s): REVISION POSTERIOR LUMBAR FUSION 1 LEVEL L4-5 on 04/01/2015   Consultants:  None  Discharged Condition: Improved  Hospital Course: Janice Wright is an 29 y.o. female who was admitted 04/01/2015 for operative treatment of radiculopathy. Patient has severe unremitting pain that affects sleep, daily activities, and work/hobbies. After pre-op clearance the patient was taken to the operating room on 04/01/2015 and underwent  Procedure(s): REVISION POSTERIOR LUMBAR FUSION 1 LEVEL  L4-5.    Patient was given perioperative antibiotics:  Anti-infectives    Start     Dose/Rate Route Frequency Ordered Stop   04/01/15 2000  ceFAZolin (ANCEF) IVPB 1 g/50 mL premix     1 g 100 mL/hr over 30 Minutes Intravenous Every 8 hours 04/01/15 1817 04/02/15 0443   04/01/15 0800  ceFAZolin (ANCEF) IVPB 2 g/50 mL premix     2 g 100 mL/hr over 30 Minutes Intravenous To Surgery 03/31/15 1041 04/01/15 1224       Patient was given sequential compression devices, early ambulation to prevent DVT.  Patient benefited maximally from hospital stay and there were no complications.    Recent vital signs: BP 108/69 mmHg  Pulse 108  Temp(Src) 98.8 F (37.1 C) (Oral)  Resp 18  Ht  (1.651 m)  Wt 100.699 kg (222 lb)  BMI 36.94 kg/m2  SpO2 94%  LMP 03/04/2015  Discharge Medications:     Medication List    STOP taking these medications        methocarbamol 500 MG  tablet  Commonly known as:  ROBAXIN      TAKE these medications        diazepam 5 MG tablet  Commonly known as:  VALIUM  Take 1 tablet (5 mg total) by mouth every 6 (six) hours as needed.     docusate sodium 250 MG capsule  Commonly known as:  COLACE  Take 250 mg by mouth daily as needed for constipation.     glipiZIDE 5 MG tablet  Commonly known as:  GLUCOTROL  Take 5 mg by mouth daily before breakfast.     lisinopril 2.5 MG tablet  Commonly known as:  PRINIVIL,ZESTRIL  Take 2.5 mg by mouth daily.     metFORMIN 500 MG tablet  Commonly known as:  GLUCOPHAGE  Take 500 mg by mouth 2 (two) times daily with a meal.     OxyCODONE 20 mg T12a 12 hr tablet  Commonly known as:  OXYCONTIN  Take 1 tablet (20 mg total) by mouth every 12 (twelve) hours.     oxyCODONE-acetaminophen 5-325 MG per tablet  Commonly known as:  PERCOCET/ROXICET  Take 1-2 tablets by mouth every 4 (four) hours as needed.     polyethylene glycol packet  Commonly known as:  MIRALAX / GLYCOLAX  Take 17 g by mouth daily as needed for severe constipation.        Diagnostic Studies: Dg  Lumbar Spine 2-3 Views  04/01/2015   CLINICAL DATA:  Revision right sided L4-5 transforaminal lumbar interbody fusion with instrumentation and allograft for right leg pain.  EXAM: DG C-ARM 61-120 MIN; LUMBAR SPINE - 2-3 VIEW  FLUOROSCOPY TIME:  Radiation Exposure Index (as provided by the fluoroscopic device):  If the device does not provide the exposure index:  Fluoroscopy Time (in minutes and seconds):  0 minutes 11 seconds  Number of Acquired Images:  2  COMPARISON:  04/01/2015  FINDINGS: Vertebral body alignment and heights are normal. There is minimal spondylosis present. Posterior fusion hardware is intact from L4-S1. Interbody fusion at the L4-5 and L5-S1 levels. Recommend correlation with findings at the time of the procedure.  IMPRESSION: Posterior fusion hardware intact from L4-S1.   Electronically Signed   By: Elberta Fortis  M.D.   On: 04/01/2015 13:45   Dg Lumbar Spine 1 View  04/01/2015   CLINICAL DATA:  Posterior fusion of L5-S1.  EXAM: LUMBAR SPINE - 1 VIEW  COMPARISON:  MRI of January 08, 2015.  FINDINGS: Single intraoperative cross-table lateral projection of the lumbar spine was obtained. The patient is status post posterior fusion of L5-S1 with bilateral intrapedicular screw placement. Moderate degenerative disc disease is noted at L4-5. One surgical probe is directed toward the posterior interspinous space of L2-3. Another appears to be directed toward the posterior portion of the S1-2 level.  IMPRESSION: Surgical localization as described above.   Electronically Signed   By: Lupita Raider, M.D.   On: 04/01/2015 11:32   Dg C-arm 1-60 Min  04/01/2015   CLINICAL DATA:  Revision right sided L4-5 transforaminal lumbar interbody fusion with instrumentation and allograft for right leg pain.  EXAM: DG C-ARM 61-120 MIN; LUMBAR SPINE - 2-3 VIEW  FLUOROSCOPY TIME:  Radiation Exposure Index (as provided by the fluoroscopic device):  If the device does not provide the exposure index:  Fluoroscopy Time (in minutes and seconds):  0 minutes 11 seconds  Number of Acquired Images:  2  COMPARISON:  04/01/2015  FINDINGS: Vertebral body alignment and heights are normal. There is minimal spondylosis present. Posterior fusion hardware is intact from L4-S1. Interbody fusion at the L4-5 and L5-S1 levels. Recommend correlation with findings at the time of the procedure.  IMPRESSION: Posterior fusion hardware intact from L4-S1.   Electronically Signed   By: Elberta Fortis M.D.   On: 04/01/2015 13:45    Disposition: 01-Home or Self Care   3 Days PO L4-5 Revision decompression and fusion  -Written scripts for pain signed and in chart -D/C instructions sheet printed and in chart -D/C today  -F/U in office 2 weeks   Signed: Georga Bora 04/15/2015, 12:33 PM

## 2016-11-14 IMAGING — CR DG CHEST 2V
2 series · 2 of 2 positions shown · non-contrast
Comparison: PA and lateral chest x-ray November 06, 2012

CLINICAL DATA: Preoperative exam prior lumbar surgery, history of
diabetes, nonsmoker.

EXAM:
CHEST  2 VIEW

[w chest pa]
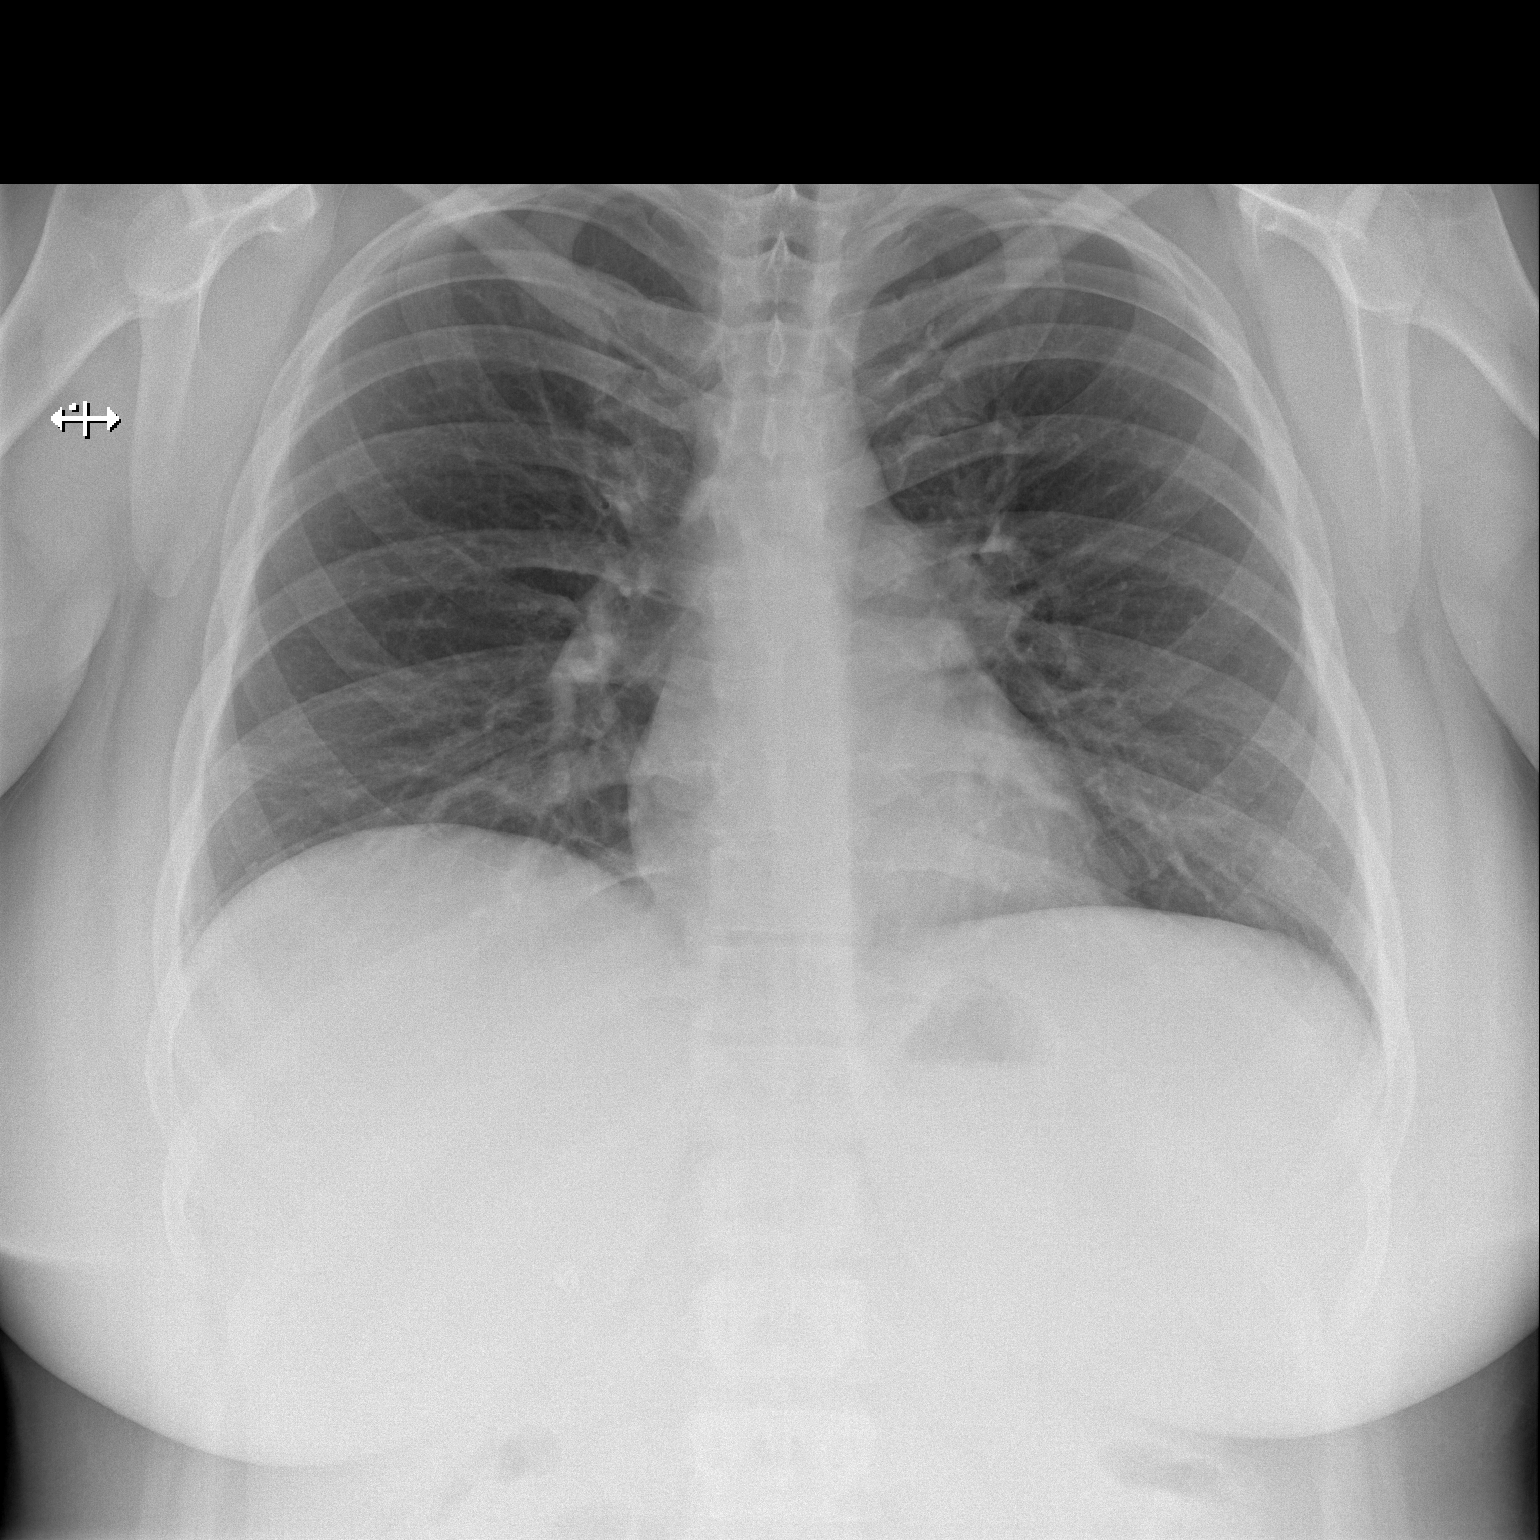

[w chest lat]
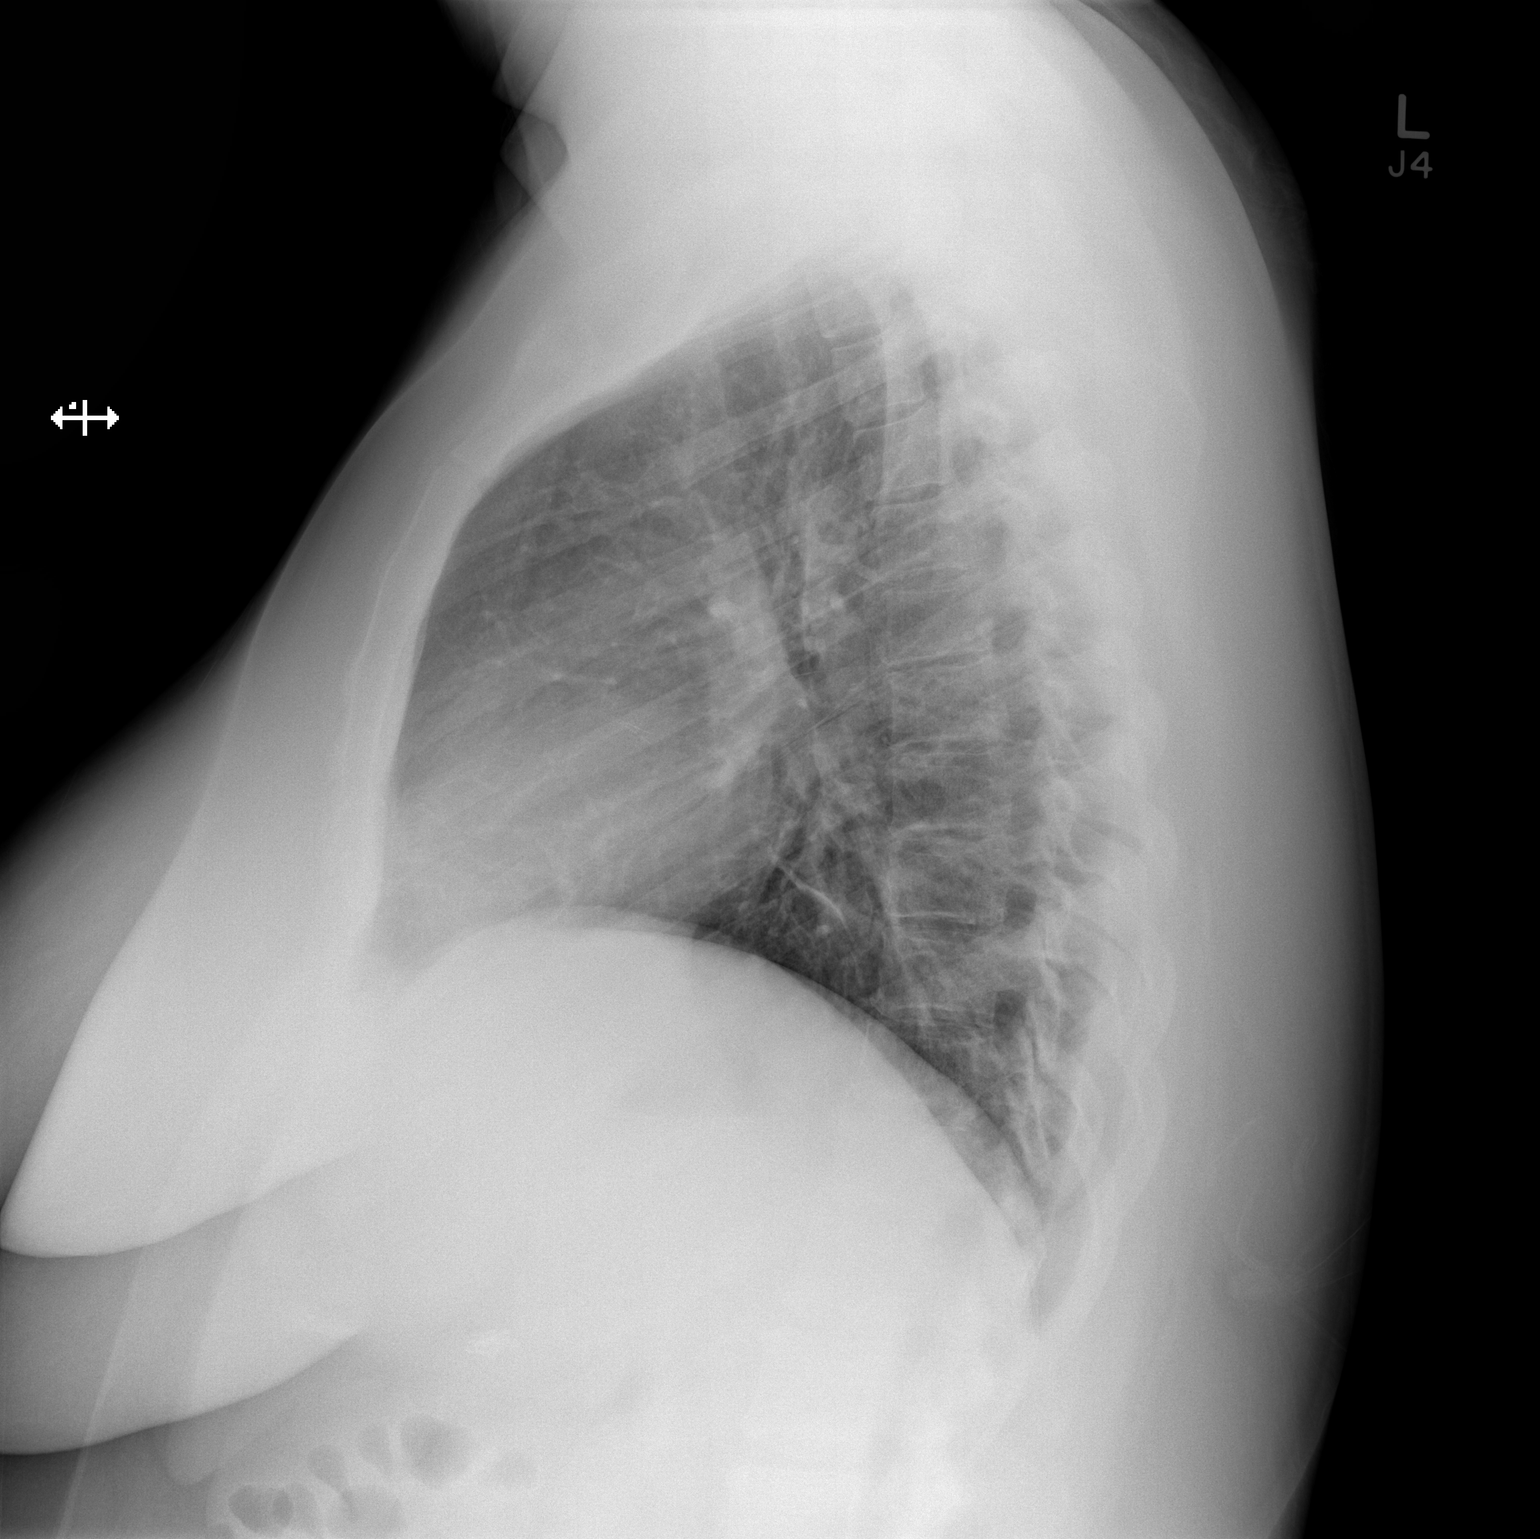

[2 of 2 positions shown; findings below may reference images not displayed]

FINDINGS: The lungs are clear. The heart and pulmonary vascularity are normal.
The mediastinum is normal in width. There is no pleural effusion.
The trachea is midline. The bony thorax is unremarkable.
IMPRESSION: There is no active cardiopulmonary disease.

## 2017-01-20 ENCOUNTER — Observation Stay (HOSPITAL_COMMUNITY)
Admission: EM | Admit: 2017-01-20 | Discharge: 2017-01-20 | Disposition: A | Discharge status: Home / Self Care | Payer: Medicaid Other | Attending: Internal Medicine | Admitting: Internal Medicine

## 2017-01-20 ENCOUNTER — Encounter (HOSPITAL_COMMUNITY): Payer: Self-pay

## 2017-01-20 ENCOUNTER — Emergency Department (HOSPITAL_COMMUNITY): Payer: Medicaid Other

## 2017-01-20 DIAGNOSIS — E1122 Type 2 diabetes mellitus with diabetic chronic kidney disease: Secondary | ICD-10-CM | POA: Diagnosis present

## 2017-01-20 DIAGNOSIS — Z992 Dependence on renal dialysis: Secondary | ICD-10-CM

## 2017-01-20 DIAGNOSIS — Z79899 Other long term (current) drug therapy: Secondary | ICD-10-CM | POA: Diagnosis not present

## 2017-01-20 DIAGNOSIS — E118 Type 2 diabetes mellitus with unspecified complications: Secondary | ICD-10-CM | POA: Diagnosis not present

## 2017-01-20 DIAGNOSIS — E860 Dehydration: Secondary | ICD-10-CM | POA: Diagnosis present

## 2017-01-20 DIAGNOSIS — R05 Cough: Secondary | ICD-10-CM | POA: Diagnosis not present

## 2017-01-20 DIAGNOSIS — R112 Nausea with vomiting, unspecified: Secondary | ICD-10-CM

## 2017-01-20 DIAGNOSIS — K529 Noninfective gastroenteritis and colitis, unspecified: Secondary | ICD-10-CM | POA: Diagnosis not present

## 2017-01-20 DIAGNOSIS — R000 Tachycardia, unspecified: Secondary | ICD-10-CM | POA: Diagnosis present

## 2017-01-20 DIAGNOSIS — R739 Hyperglycemia, unspecified: Secondary | ICD-10-CM

## 2017-01-20 DIAGNOSIS — N186 End stage renal disease: Secondary | ICD-10-CM

## 2017-01-20 DIAGNOSIS — E1165 Type 2 diabetes mellitus with hyperglycemia: Secondary | ICD-10-CM | POA: Diagnosis not present

## 2017-01-20 DIAGNOSIS — G894 Chronic pain syndrome: Secondary | ICD-10-CM | POA: Diagnosis not present

## 2017-01-20 DIAGNOSIS — R197 Diarrhea, unspecified: Secondary | ICD-10-CM | POA: Insufficient documentation

## 2017-01-20 DIAGNOSIS — Z7984 Long term (current) use of oral hypoglycemic drugs: Secondary | ICD-10-CM | POA: Insufficient documentation

## 2017-01-20 DIAGNOSIS — R109 Unspecified abdominal pain: Secondary | ICD-10-CM | POA: Diagnosis not present

## 2017-01-20 DIAGNOSIS — J209 Acute bronchitis, unspecified: Secondary | ICD-10-CM | POA: Diagnosis present

## 2017-01-20 LAB — I-STAT CG4 LACTIC ACID, ED
LACTIC ACID, VENOUS: 2.27 mmol/L — AB (ref 0.5–1.9)
LACTIC ACID, VENOUS: 1.52 mmol/L (ref 0.5–1.9)

## 2017-01-20 LAB — GLUCOSE, CAPILLARY: GLUCOSE-CAPILLARY: 155 mg/dL — AB (ref 65–99)

## 2017-01-20 LAB — COMPREHENSIVE METABOLIC PANEL
ALT: 29 U/L (ref 14–54)
AST: 30 U/L (ref 15–41)
Albumin: 3.8 g/dL (ref 3.5–5.0)
Alkaline Phosphatase: 80 U/L (ref 38–126)
Anion gap: 10 (ref 5–15)
BUN: 12 mg/dL (ref 6–20)
CALCIUM: 9.1 mg/dL (ref 8.9–10.3)
CHLORIDE: 103 mmol/L (ref 101–111)
CO2: 23 mmol/L (ref 22–32)
Creatinine, Ser: 0.63 mg/dL (ref 0.44–1.00)
Glucose, Bld: 212 mg/dL — ABNORMAL HIGH (ref 65–99)
Potassium: 3.9 mmol/L (ref 3.5–5.1)
Sodium: 136 mmol/L (ref 135–145)
Total Bilirubin: 0.6 mg/dL (ref 0.3–1.2)
Total Protein: 7.4 g/dL (ref 6.5–8.1)

## 2017-01-20 LAB — URINALYSIS, ROUTINE W REFLEX MICROSCOPIC
BILIRUBIN URINE: NEGATIVE
Glucose, UA: NEGATIVE mg/dL
Ketones, ur: NEGATIVE mg/dL
LEUKOCYTES UA: NEGATIVE
Nitrite: NEGATIVE
PH: 5 (ref 5.0–8.0)
Protein, ur: 30 mg/dL — AB
Specific Gravity, Urine: 1.028 (ref 1.005–1.030)

## 2017-01-20 LAB — CBC
HCT: 47.9 % — ABNORMAL HIGH (ref 36.0–46.0)
Hemoglobin: 16.1 g/dL — ABNORMAL HIGH (ref 12.0–15.0)
MCH: 29.9 pg (ref 26.0–34.0)
MCHC: 33.6 g/dL (ref 30.0–36.0)
MCV: 88.9 fL (ref 78.0–100.0)
PLATELETS: 317 10*3/uL (ref 150–400)
RBC: 5.39 MIL/uL — AB (ref 3.87–5.11)
RDW: 12.7 % (ref 11.5–15.5)
WBC: 15.3 10*3/uL — ABNORMAL HIGH (ref 4.0–10.5)

## 2017-01-20 LAB — POC URINE PREG, ED: PREG TEST UR: NEGATIVE

## 2017-01-20 LAB — TSH: TSH: 0.157 u[IU]/mL — ABNORMAL LOW (ref 0.350–4.500)

## 2017-01-20 LAB — CBG MONITORING, ED: Glucose-Capillary: 223 mg/dL — ABNORMAL HIGH (ref 65–99)

## 2017-01-20 LAB — LIPASE, BLOOD: Lipase: 31 U/L (ref 11–51)

## 2017-01-20 MED ORDER — SODIUM CHLORIDE 0.9 % IV BOLUS (SEPSIS)
1000.0000 mL | Freq: Once | INTRAVENOUS | Status: AC
  Administered 2017-01-20: 1000 mL via INTRAVENOUS

## 2017-01-20 MED ORDER — OXYCODONE HCL ER 20 MG PO T12A
20.0000 mg | EXTENDED_RELEASE_TABLET | Freq: Two times a day (BID) | ORAL | Status: DC
  Administered 2017-01-20: 20 mg via ORAL
  Filled 2017-01-20: qty 1

## 2017-01-20 MED ORDER — IOPAMIDOL (ISOVUE-300) INJECTION 61%
100.0000 mL | Freq: Once | INTRAVENOUS | Status: AC | PRN
  Administered 2017-01-20: 100 mL via INTRAVENOUS

## 2017-01-20 MED ORDER — ENOXAPARIN SODIUM 60 MG/0.6ML ~~LOC~~ SOLN
50.0000 mg | SUBCUTANEOUS | Status: DC
  Administered 2017-01-20: 14:00:00 50 mg via SUBCUTANEOUS
  Filled 2017-01-20: qty 0.6

## 2017-01-20 MED ORDER — ONDANSETRON HCL 4 MG PO TABS
4.0000 mg | ORAL_TABLET | Freq: Four times a day (QID) | ORAL | Status: DC | PRN
  Administered 2017-01-20: 4 mg via ORAL
  Filled 2017-01-20: qty 1

## 2017-01-20 MED ORDER — INSULIN ASPART 100 UNIT/ML ~~LOC~~ SOLN: 0.0000 [IU] | Freq: Three times a day (TID) | SUBCUTANEOUS | Status: DC

## 2017-01-20 MED ORDER — POTASSIUM CHLORIDE IN NACL 20-0.9 MEQ/L-% IV SOLN
INTRAVENOUS | Status: DC
  Administered 2017-01-20: 14:00:00 via INTRAVENOUS

## 2017-01-20 MED ORDER — ONDANSETRON HCL 4 MG/2ML IJ SOLN: 4.0000 mg | Freq: Four times a day (QID) | INTRAMUSCULAR | Status: DC | PRN

## 2017-01-20 MED ORDER — ACETAMINOPHEN 650 MG RE SUPP: 650.0000 mg | Freq: Four times a day (QID) | RECTAL | Status: DC | PRN

## 2017-01-20 MED ORDER — HYDROCODONE-ACETAMINOPHEN 5-325 MG PO TABS: 1.0000 | ORAL_TABLET | ORAL | Status: DC | PRN

## 2017-01-20 MED ORDER — IPRATROPIUM-ALBUTEROL 0.5-2.5 (3) MG/3ML IN SOLN
3.0000 mL | Freq: Four times a day (QID) | RESPIRATORY_TRACT | Status: DC
  Administered 2017-01-20: 3 mL via RESPIRATORY_TRACT
  Filled 2017-01-20: qty 3

## 2017-01-20 MED ORDER — FENTANYL CITRATE (PF) 100 MCG/2ML IJ SOLN
50.0000 ug | Freq: Once | INTRAMUSCULAR | Status: AC
  Administered 2017-01-20: 50 ug via INTRAVENOUS
  Filled 2017-01-20: qty 2

## 2017-01-20 MED ORDER — ACETAMINOPHEN 325 MG PO TABS: 650.0000 mg | ORAL_TABLET | Freq: Four times a day (QID) | ORAL | Status: DC | PRN

## 2017-01-20 MED ORDER — ONDANSETRON HCL 4 MG/2ML IJ SOLN
4.0000 mg | Freq: Once | INTRAMUSCULAR | Status: AC
  Administered 2017-01-20: 4 mg via INTRAVENOUS
  Filled 2017-01-20: qty 2

## 2017-01-20 MED ORDER — GUAIFENESIN ER 600 MG PO TB12
1200.0000 mg | ORAL_TABLET | Freq: Two times a day (BID) | ORAL | Status: DC
  Administered 2017-01-20: 1200 mg via ORAL
  Filled 2017-01-20: qty 2

## 2017-01-20 MED ORDER — LOPERAMIDE HCL 2 MG PO CAPS: 2.0000 mg | ORAL_CAPSULE | Freq: Three times a day (TID) | ORAL | Status: DC | PRN

## 2017-01-20 NOTE — Progress Notes (Signed)
Patient leaving AMA, patient states" I have no one to watch my kids", Asked patient if there was other family members who could help,she said no. I encourage patient to think about her decision. Dr Kerry HoughMemon notified.

## 2017-01-20 NOTE — Progress Notes (Signed)
Inpatient Diabetes Program Recommendations  AACE/ADA: New Consensus Statement on Inpatient Glycemic Control (2015)  Target Ranges:  Prepandial:   less than 140 mg/dL      Peak postprandial:   less than 180 mg/dL (1-2 hours)      Critically ill patients:  140 - 180 mg/dL   Results for Janice NevinsSTACEY, Janice Wright (MRN [id number]) as of 01/20/2017 10:06  Ref. Range 01/20/2017 01:22  Glucose-Capillary Latest Ref Range: 65 - 99 mg/dL 409223 (H)   Review of Glycemic Control  Diabetes history: DM2 Outpatient Diabetes medications: Metformin 500 mg BID Current orders for Inpatient glycemic control: None; has not bee seen by hospitalist yet  Inpatient Diabetes Program Recommendations: Correction (SSI): Please consider ordering CBGs with Novolog correction scale ACHS. HgbA1C: Please consider ordering an A1C to evaluate glycemic control over the past 2-3 months.  Thanks, Orlando PennerMarie Erol Flanagin, RN, MSN, CDE Diabetes Coordinator Inpatient Diabetes Program [phone] (Team Pager from 8am to 5pm)

## 2017-01-20 NOTE — ED Notes (Signed)
Pt placed on 2L 02 [place] for 02 Sat 92%.

## 2017-01-20 NOTE — ED Notes (Signed)
ED Provider at bedside. 

## 2017-01-20 NOTE — Discharge Summary (Signed)
Physician Discharge Summary  Janice Wright WUJ:[id number] DOB: [birth year] DOA: 01/20/2017  PCP: Rebecka ApleyHEMBERG, KATHERINE V, NP  Admit date: 01/20/2017 Discharge date: 01/20/2017  PATIENT LEFT THE HOSPITAL AGAINST MEDICAL ADVICE  Brief/Interim Summary: This patient presents to the hospital with persistent vomiting, dehydration and dizziness. She is found to be tachycardic in the emergency room and had an elevated lactic acid. She complained of abdominal pain and CT imaging of the abdomen and pelvis was unremarkable. She was admitted for observation for further IV hydration and to monitor her tachycardia. Heart rates are ranging in the 130s. She was admitted to the medical unit and started on IV fluids. Shortly after admission, she was very adamant about being discharged. She was advised that due to her persistent tachycardia was recommended that she stay in the hospital for now. Due to issues with child care, she felt that she could not stay in the hospital and signed out AGAINST MEDICAL ADVICE.  Discharge Diagnoses:  Active Problems:   Dehydration   Bronchitis, acute   Gastroenteritis   Tachycardia   Type 2 diabetes mellitus with complication, without long-term current use of insulin (HCC)   Chronic pain syndrome     No Known Allergies    Procedures/Studies: Dg Chest 2 View  Result Date: 01/20/2017 CLINICAL DATA:  31 year old female with cough EXAM: CHEST  2 VIEW COMPARISON:  Chest radiograph dated 02/23/2015 FINDINGS: There is shallow inspiration with minimal bibasilar platelike atelectasis. No focal consolidation, pleural effusion, or pneumothorax. The cardiac silhouette is within normal limits. No acute osseous pathology. IMPRESSION: No active cardiopulmonary disease. Electronically Signed   By: Elgie CollardArash  Radparvar M.D.   On: 01/20/2017 04:33   Ct Abdomen Pelvis W Contrast  Result Date: 01/20/2017 CLINICAL DATA:  31 year old female with left lower quadrant abdominal pain. EXAM: CT ABDOMEN  AND PELVIS WITH CONTRAST TECHNIQUE: Multidetector CT imaging of the abdomen and pelvis was performed using the standard protocol following bolus administration of intravenous contrast. CONTRAST:  100mL ISOVUE-300 IOPAMIDOL (ISOVUE-300) INJECTION 61% COMPARISON:  None. FINDINGS: Lower chest: Bilateral lower lobe linear and patchy densities, more prominent on the left most likely represent atelectatic changes. Pneumonia is less likely. Clinical correlation is recommended. There is no intra-abdominal free air.  No free fluid identified. Hepatobiliary: Cholecystectomy. Diffuse fatty infiltration of the liver. No intrahepatic biliary ductal dilatation. Pancreas: Unremarkable. No pancreatic ductal dilatation or surrounding inflammatory changes. Spleen: Normal in size without focal abnormality. Adrenals/Urinary Tract: The adrenal glands are unremarkable. There multiple punctate nonobstructing right renal calculi measuring up to 3 mm in the inferior pole of the right kidney. There is mild right pelvicaliectasis. No definite obstructing stone identified. Findings may represent a recently passed right renal stone. The left kidney is unremarkable. The left ureter and urinary bladder appear unremarkable as well. An ill-defined 19 x 8 mm exophytic area in the superior pole of the right kidney noted which is not well characterized. Although this may represent a complex cyst or a protrusion of renal parenchyma other lesions are not excluded. Further evaluation with ultrasound is recommended. Stomach/Bowel: There is loose stool within the colon compatible with diarrheal state. Correlation with clinical exam and stool cultures recommended. There is no evidence of bowel obstruction or active inflammation. Normal appendix. Vascular/Lymphatic: No significant vascular findings are present. No enlarged abdominal or pelvic lymph nodes. Reproductive: The uterus and ovaries are grossly unremarkable. Other: None Musculoskeletal: L4-S1  posterior fusion hardware. No acute fracture. IMPRESSION: 1. Diarrheal state. Correlation with clinical exam and stool cultures recommended.  No bowel obstruction or active inflammation. Normal appendix. 2. Multiple small nonobstructing right renal calculi. Mild right pelvocaliectasis possibly related to a recently passed right renal stone. Correlation with urinalysis recommended to exclude UTI. 3. A small exophytic lesion from the superior pole of the right kidney is not characterized on this CT. Further evaluation with ultrasound recommended. 4. Fatty liver. Electronically Signed   By: Elgie Collard M.D.   On: 01/20/2017 06:45        The results of significant diagnostics from this hospitalization (including imaging, microbiology, ancillary and laboratory) are listed below for reference.     Microbiology: No results found for this or any previous visit (from the past 240 hour(s)).   Labs: BNP (last 3 results) No results for input(s): BNP in the last 8760 hours. Basic Metabolic Panel:  Recent Labs Lab 01/20/17 0159  NA 136  K 3.9  CL 103  CO2 23  GLUCOSE 212*  BUN 12  CREATININE 0.63  CALCIUM 9.1   Liver Function Tests:  Recent Labs Lab 01/20/17 0159  AST 30  ALT 29  ALKPHOS 80  BILITOT 0.6  PROT 7.4  ALBUMIN 3.8    Recent Labs Lab 01/20/17 0159  LIPASE 31   No results for input(s): AMMONIA in the last 168 hours. CBC:  Recent Labs Lab 01/20/17 0159  WBC 15.3*  HGB 16.1*  HCT 47.9*  MCV 88.9  PLT 317   Cardiac Enzymes: No results for input(s): CKTOTAL, CKMB, CKMBINDEX, TROPONINI in the last 168 hours. BNP: Invalid input(s): POCBNP CBG:  Recent Labs Lab 01/20/17 0122 01/20/17 1411  GLUCAP 223* 155*   D-Dimer No results for input(s): DDIMER in the last 72 hours. Hgb A1c No results for input(s): HGBA1C in the last 72 hours. Lipid Profile No results for input(s): CHOL, HDL, LDLCALC, TRIG, CHOLHDL, LDLDIRECT in the last 72 hours. Thyroid  function studies  Recent Labs  01/20/17 1353  TSH 0.157*   Anemia work up No results for input(s): VITAMINB12, FOLATE, FERRITIN, TIBC, IRON, RETICCTPCT in the last 72 hours. Urinalysis    Component Value Date/Time   COLORURINE YELLOW 01/20/2017 0201   APPEARANCEUR HAZY (A) 01/20/2017 0201   LABSPEC 1.028 01/20/2017 0201   PHURINE 5.0 01/20/2017 0201   GLUCOSEU NEGATIVE 01/20/2017 0201   HGBUR SMALL (A) 01/20/2017 0201   BILIRUBINUR NEGATIVE 01/20/2017 0201   KETONESUR NEGATIVE 01/20/2017 0201   PROTEINUR 30 (A) 01/20/2017 0201   UROBILINOGEN 0.2 03/23/2015 0930   NITRITE NEGATIVE 01/20/2017 0201   LEUKOCYTESUR NEGATIVE 01/20/2017 0201   Sepsis Labs Invalid input(s): PROCALCITONIN,  WBC,  LACTICIDVEN Microbiology No results found for this or any previous visit (from the past 240 hour(s)).   Time coordinating discharge: 10 minutes  SIGNED:   Erick Blinks, MD  [facility] 01/20/2017, 7:07 PM Pager   If 7PM-7AM, please contact night-coverage [email or web address] Password [email or web address]

## 2017-01-20 NOTE — H&P (Signed)
History and Physical    Janice Wright UJW:[id number] DOB: Dec 22, 1985 DOA: 01/20/2017  PCP: Rebecka Apley, NP  Patient coming from: home  I have personally briefly reviewed patient's old medical records in [facility]  Chief Complaint: vomiting  HPI: Janice Wright is a 31 y.o. female with medical history significant of diabetes, who presents to ED with complaints of vomiting. Patient has reportedly been having cough and wheezing since Sunday. She'll contact her primary care physician the day before yesterday and was prescribed a Z-Pak and cough medicine for bronchitis. She began to have nausea the day before yesterday and yesterday had persistent vomiting. She's also had diarrhea since yesterday. She is feeling lightheaded and dizzy. She did have some chest pain yesterday which she attributed to persistent vomiting. No dysuria. No fever.  ED Course: In the emergency room, she was noted to be significant tachycardic with heart rate in the 130s. CT scan of the abdomen and pelvis was unrevealing. Lactic acid was noted to be elevated, but improved with hydration. Due to her persistent tachycardia and ongoing symptoms, she is referred for admission.  Review of Systems: As per HPI otherwise 10 point review of systems negative.    Past Medical History:  Diagnosis Date  . Anxiety   . Chronic lower back pain   . Constipation due to opioid therapy   . Depression 2014   hx; "resolved"  . Fatty liver   . Hyperlipidemia   . Polycystic ovarian syndrome   . PONV (postoperative nausea and vomiting)   . Type II diabetes mellitus (HCC) dx'd 2014    Past Surgical History:  Procedure Laterality Date  . BACK SURGERY     x 2  . CESAREAN SECTION  2011  . KNEE ARTHROSCOPY Left 2007  . LAPAROSCOPIC CHOLECYSTECTOMY  2006  . LUMBAR DISC SURGERY  08/2008; 12/2010  . POSTERIOR LUMBAR FUSION  10/2012  . POSTERIOR LUMBAR FUSION  04/01/2015   revision/notes 04/01/2015  . WISDOM TOOTH EXTRACTION     "2 @ a time"     reports that she has never smoked. She has never used smokeless tobacco. She reports that she does not drink alcohol or use drugs.  No Known Allergies  Family history: Family history reviewed and not pertinent  Prior to Admission medications   Medication Sig Start Date End Date Taking? Authorizing Provider  azithromycin (ZITHROMAX) 250 MG tablet Take by mouth daily.   Yes Historical Provider, MD  diazepam (VALIUM) 5 MG tablet Take 1 tablet (5 mg total) by mouth every 6 (six) hours as needed. Patient not taking: Reported on 02/19/2015 11/10/12   Mack Hook, MD  docusate sodium (COLACE) 250 MG capsule Take 250 mg by mouth daily as needed for constipation.    Historical Provider, MD  glipiZIDE (GLUCOTROL) 5 MG tablet Take 5 mg by mouth daily before breakfast.    Historical Provider, MD  lisinopril (PRINIVIL,ZESTRIL) 2.5 MG tablet Take 2.5 mg by mouth daily.    Historical Provider, MD  metFORMIN (GLUCOPHAGE) 500 MG tablet Take 500 mg by mouth 2 (two) times daily with a meal.     Historical Provider, MD  OxyCODONE (OXYCONTIN) 20 mg T12A 12 hr tablet Take 1 tablet (20 mg total) by mouth every 12 (twelve) hours. 04/04/15   Marshia Ly, PA-C  oxyCODONE-acetaminophen (PERCOCET/ROXICET) 5-325 MG per tablet Take 1-2 tablets by mouth every 4 (four) hours as needed. Patient not taking: Reported on 02/19/2015 11/10/12   Mack Hook, MD  polyethylene glycol (  MIRALAX / GLYCOLAX) packet Take 17 g by mouth daily as needed for severe constipation.    Historical Provider, MD    Physical Exam: Vitals:   01/20/17 0757 01/20/17 0758 01/20/17 0800 01/20/17 0833  BP:   105/60 (!) 109/59  Pulse: (!) 123 (!) 125  (!) 115  Resp:    (!) 21  Temp:    98.9 F (37.2 C)  TempSrc:    Oral  SpO2: 95% 95%  95%  Weight:    103.1 kg (227 lb 3.2 oz)  Height:     (1.676 m)    Constitutional: NAD, calm, comfortable Vitals:   01/20/17 0757 01/20/17 0758 01/20/17 0800 01/20/17 0833  BP:    105/60 (!) 109/59  Pulse: (!) 123 (!) 125  (!) 115  Resp:    (!) 21  Temp:    98.9 F (37.2 C)  TempSrc:    Oral  SpO2: 95% 95%  95%  Weight:    103.1 kg (227 lb 3.2 oz)  Height:     (1.676 m)   Eyes: PERRL, lids and conjunctivae normal ENMT: Mucous membranes are moist. Posterior pharynx clear of any exudate or lesions.Normal dentition.  Neck: normal, supple, no masses, no thyromegaly Respiratory: clear to auscultation bilaterally, no wheezing, no crackles. Normal respiratory effort. No accessory muscle use.  Cardiovascular: tachycardic, no murmurs / rubs / gallops. No extremity edema. 2+ pedal pulses. No carotid bruits.  Abdomen: tender in LLQ, soft, no masses palpated. No hepatosplenomegaly. Bowel sounds positive.  Musculoskeletal: no clubbing / cyanosis. No joint deformity upper and lower extremities. Good ROM, no contractures. Normal muscle tone.  Skin: no rashes, lesions, ulcers. No induration Neurologic: CN 2-12 grossly intact. Sensation intact, DTR normal. Strength 5/5 in all 4.  Psychiatric: Normal judgment and insight. Alert and oriented x 3. Normal mood.    Labs on Admission: I have personally reviewed following labs and imaging studies  CBC:  Recent Labs Lab 01/20/17 0159  WBC 15.3*  HGB 16.1*  HCT 47.9*  MCV 88.9  PLT 317   Basic Metabolic Panel:  Recent Labs Lab 01/20/17 0159  NA 136  K 3.9  CL 103  CO2 23  GLUCOSE 212*  BUN 12  CREATININE 0.63  CALCIUM 9.1   GFR: Estimated Creatinine Clearance: 124.7 mL/min (by C-G formula based on SCr of 0.63 mg/dL). Liver Function Tests:  Recent Labs Lab 01/20/17 0159  AST 30  ALT 29  ALKPHOS 80  BILITOT 0.6  PROT 7.4  ALBUMIN 3.8    Recent Labs Lab 01/20/17 0159  LIPASE 31   No results for input(s): AMMONIA in the last 168 hours. Coagulation Profile: No results for input(s): INR, PROTIME in the last 168 hours. Cardiac Enzymes: No results for input(s): CKTOTAL, CKMB, CKMBINDEX, TROPONINI in  the last 168 hours. BNP (last 3 results) No results for input(s): PROBNP in the last 8760 hours. HbA1C: No results for input(s): HGBA1C in the last 72 hours. CBG:  Recent Labs Lab 01/20/17 0122  GLUCAP 223*   Lipid Profile: No results for input(s): CHOL, HDL, LDLCALC, TRIG, CHOLHDL, LDLDIRECT in the last 72 hours. Thyroid Function Tests: No results for input(s): TSH, T4TOTAL, FREET4, T3FREE, THYROIDAB in the last 72 hours. Anemia Panel: No results for input(s): VITAMINB12, FOLATE, FERRITIN, TIBC, IRON, RETICCTPCT in the last 72 hours. Urine analysis:    Component Value Date/Time   COLORURINE YELLOW 01/20/2017 0201   APPEARANCEUR HAZY (A) 01/20/2017 0201   LABSPEC 1.028 01/20/2017  0201   PHURINE 5.0 01/20/2017 0201   GLUCOSEU NEGATIVE 01/20/2017 0201   HGBUR SMALL (A) 01/20/2017 0201   BILIRUBINUR NEGATIVE 01/20/2017 0201   KETONESUR NEGATIVE 01/20/2017 0201   PROTEINUR 30 (A) 01/20/2017 0201   UROBILINOGEN 0.2 03/23/2015 0930   NITRITE NEGATIVE 01/20/2017 0201   LEUKOCYTESUR NEGATIVE 01/20/2017 0201    Radiological Exams on Admission: Dg Chest 2 View  Result Date: 01/20/2017 CLINICAL DATA:  31 year old female with cough EXAM: CHEST  2 VIEW COMPARISON:  Chest radiograph dated 02/23/2015 FINDINGS: There is shallow inspiration with minimal bibasilar platelike atelectasis. No focal consolidation, pleural effusion, or pneumothorax. The cardiac silhouette is within normal limits. No acute osseous pathology. IMPRESSION: No active cardiopulmonary disease. Electronically Signed   By: Elgie Collard M.D.   On: 01/20/2017 04:33   Ct Abdomen Pelvis W Contrast  Result Date: 01/20/2017 CLINICAL DATA:  31 year old female with left lower quadrant abdominal pain. EXAM: CT ABDOMEN AND PELVIS WITH CONTRAST TECHNIQUE: Multidetector CT imaging of the abdomen and pelvis was performed using the standard protocol following bolus administration of intravenous contrast. CONTRAST:  ISOVUE-300  IOPAMIDOL (ISOVUE-300) INJECTION 61% COMPARISON:  None. FINDINGS: Lower chest: Bilateral lower lobe linear and patchy densities, more prominent on the left most likely represent atelectatic changes. Pneumonia is less likely. Clinical correlation is recommended. There is no intra-abdominal free air.  No free fluid identified. Hepatobiliary: Cholecystectomy. Diffuse fatty infiltration of the liver. No intrahepatic biliary ductal dilatation. Pancreas: Unremarkable. No pancreatic ductal dilatation or surrounding inflammatory changes. Spleen: Normal in size without focal abnormality. Adrenals/Urinary Tract: The adrenal glands are unremarkable. There multiple punctate nonobstructing right renal calculi measuring up to 3 mm in the inferior pole of the right kidney. There is mild right pelvicaliectasis. No definite obstructing stone identified. Findings may represent a recently passed right renal stone. The left kidney is unremarkable. The left ureter and urinary bladder appear unremarkable as well. An ill-defined 19 x 8 mm exophytic area in the superior pole of the right kidney noted which is not well characterized. Although this may represent a complex cyst or a protrusion of renal parenchyma other lesions are not excluded. Further evaluation with ultrasound is recommended. Stomach/Bowel: There is loose stool within the colon compatible with diarrheal state. Correlation with clinical exam and stool cultures recommended. There is no evidence of bowel obstruction or active inflammation. Normal appendix. Vascular/Lymphatic: No significant vascular findings are present. No enlarged abdominal or pelvic lymph nodes. Reproductive: The uterus and ovaries are grossly unremarkable. Other: None Musculoskeletal: L4-S1 posterior fusion hardware. No acute fracture. IMPRESSION: 1. Diarrheal state. Correlation with clinical exam and stool cultures recommended. No bowel obstruction or active inflammation. Normal appendix. 2. Multiple  small nonobstructing right renal calculi. Mild right pelvocaliectasis possibly related to a recently passed right renal stone. Correlation with urinalysis recommended to exclude UTI. 3. A small exophytic lesion from the superior pole of the right kidney is not characterized on this CT. Further evaluation with ultrasound recommended. 4. Fatty liver. Electronically Signed   By: Elgie Collard M.D.   On: 01/20/2017 06:45    EKG: Independently reviewed. Sinus tachycardia  Assessment/Plan Active Problems:   Dehydration   Bronchitis, acute   Gastroenteritis   Tachycardia   Type 2 diabetes mellitus with complication, without long-term current use of insulin (HCC)   Chronic pain syndrome    1. Tachycardia. EKG shows sinus rhythm. Possibly related to volume depletion she needs more hydration. Continue with IV fluids. Check TSH.  2. Acute Gastroenteritis.  Likely viral. Continue supportive treatment with antiemetics and antidiarrheals. Advance diet as tolerated.  3. Acute Bronchitis. Treat supportively with nebs and mucolytics  4. DM. Start on sliding scale insulin. Hold oral agents.  5. Chronic pain syndrome. Continue oxycontin  6. Dehydration from vomiting/diarrhea. Continue IV fluids.  DVT prophylaxis: lovenox Code Status: full code Family Communication: no family present Disposition Plan: discharge home once improved Consults called:  Admission status: observation/ telemetry   Milda Lindvall MD [facility] Pager [phone]  If 7PM-7AM, please contact night-coverage [email or web address] Password [facility]  01/20/2017, 11:55 AM

## 2017-01-20 NOTE — ED Triage Notes (Signed)
Pt diagnosed with bronchitis yesterday and started a z-pak.  Pt started vomiting last night and has had 3 episodes of diarrhea with a low grade fever at home.  Pt c/o pain to left upper abd that started this am.

## 2017-01-20 NOTE — ED Provider Notes (Signed)
[facility] DEPT Provider Note   CSN: [id number] Arrival date & time: 01/20/17  0051     History   Chief Complaint Chief Complaint  Patient presents with  . Emesis    HPI Shasha Buchbinder is a 31 y.o. female.  The history is provided by the patient and a significant other.  Emesis   This is a new problem. The current episode started 6 to 12 hours ago. The problem has been gradually worsening. There has been no fever. Associated symptoms include abdominal pain, chills, cough and diarrhea.  pt reports recent diagnosis of bronchitis and was placed on z-pack and also hydrocodone cough syrup Prior to arrival she had onset of nausea/vomiting/diarrhea and LUQ abd pain No fever but reports chills No dysuria No vaginal bleeding Nothing improves her symptoms Drinking fluids worsens her symptoms  Past Medical History:  Diagnosis Date  . Anxiety   . Chronic lower back pain   . Constipation due to opioid therapy   . Depression 2014   hx; "resolved"  . Fatty liver   . Hyperlipidemia   . Polycystic ovarian syndrome   . PONV (postoperative nausea and vomiting)   . Type II diabetes mellitus (HCC) dx'd 2014    Patient Active Problem List   Diagnosis Date Noted  . Radiculopathy 04/01/2015    Past Surgical History:  Procedure Laterality Date  . BACK SURGERY     x 2  . CESAREAN SECTION  2011  . KNEE ARTHROSCOPY Left 2007  . LAPAROSCOPIC CHOLECYSTECTOMY  2006  . LUMBAR DISC SURGERY  08/2008; 12/2010  . POSTERIOR LUMBAR FUSION  10/2012  . POSTERIOR LUMBAR FUSION  04/01/2015   revision/notes 04/01/2015  . WISDOM TOOTH EXTRACTION     "2 @ a time"    OB History    No data available       Home Medications    Prior to Admission medications   Medication Sig Start Date End Date Taking? Authorizing Provider  azithromycin (ZITHROMAX) 250 MG tablet Take by mouth daily.   Yes Historical Provider, MD  diazepam (VALIUM) 5 MG tablet Take 1 tablet (5 mg total) by mouth every 6 (six)  hours as needed. Patient not taking: Reported on 02/19/2015 11/10/12   Mack Hook, MD  docusate sodium (COLACE) 250 MG capsule Take 250 mg by mouth daily as needed for constipation.    Historical Provider, MD  glipiZIDE (GLUCOTROL) 5 MG tablet Take 5 mg by mouth daily before breakfast.    Historical Provider, MD  lisinopril (PRINIVIL,ZESTRIL) 2.5 MG tablet Take 2.5 mg by mouth daily.    Historical Provider, MD  metFORMIN (GLUCOPHAGE) 500 MG tablet Take 500 mg by mouth 2 (two) times daily with a meal.     Historical Provider, MD  OxyCODONE (OXYCONTIN) 20 mg T12A 12 hr tablet Take 1 tablet (20 mg total) by mouth every 12 (twelve) hours. 04/04/15   Marshia Ly, PA-C  oxyCODONE-acetaminophen (PERCOCET/ROXICET) 5-325 MG per tablet Take 1-2 tablets by mouth every 4 (four) hours as needed. Patient not taking: Reported on 02/19/2015 11/10/12   Mack Hook, MD  polyethylene glycol [facility] Ethelene Hal) packet Take 17 g by mouth daily as needed for severe constipation.    Historical Provider, MD    Family History No family history on file.  Social History Social History  Substance Use Topics  . Smoking status: Never Smoker  . Smokeless tobacco: Never Used  . Alcohol use No     Allergies   Patient has no  known allergies.   Review of Systems Review of Systems  Constitutional: Positive for chills.  Respiratory: Positive for cough.   Gastrointestinal: Positive for abdominal pain, diarrhea and vomiting.  Genitourinary: Negative for dysuria, vaginal bleeding and vaginal discharge.  All other systems reviewed and are negative.    Physical Exam Updated Vital Signs BP 118/61 (BP Location: Right Arm)   Pulse (!) 129   Temp 98.3 F (36.8 C) (Oral)   Resp (!) 26   Ht 5\' 6"  (1.676 m)   Wt 99.3 kg   LMP 12/28/2016   SpO2 95%   BMI 35.35 kg/m   Physical Exam CONSTITUTIONAL: Well developed/well nourished, anxious HEAD: Normocephalic/atraumatic EYES: EOMI/PERRL ENMT: Mucous membranes  dry, pt whispering, but no stridor noted NECK: supple no meningeal signs SPINE/BACK:entire spine nontender CV: S1/S2 noted, no murmurs/rubs/gallops noted LUNGS: Lungs are clear to auscultation bilaterally, no apparent distress ABDOMEN: soft, nontender, no rebound or guarding, bowel sounds noted throughout abdomen GU:no cva tenderness NEURO: Pt is awake/alert/appropriate, moves all extremitiesx4.  No facial droop.   EXTREMITIES: pulses normal/equal, full ROM SKIN: warm, color normal PSYCH: anxious  ED Treatments / Results  Labs (all labs ordered are listed, but only abnormal results are displayed) Labs Reviewed  COMPREHENSIVE METABOLIC PANEL - Abnormal; Notable for the following:       Result Value   Glucose, Bld 212 (*)    All other components within normal limits  CBC - Abnormal; Notable for the following:    WBC 15.3 (*)    RBC 5.39 (*)    Hemoglobin 16.1 (*)    HCT 47.9 (*)    All other components within normal limits  URINALYSIS, ROUTINE W REFLEX MICROSCOPIC - Abnormal; Notable for the following:    APPearance HAZY (*)    Hgb urine dipstick SMALL (*)    Protein, ur 30 (*)    Bacteria, UA RARE (*)    All other components within normal limits  CBG MONITORING, ED - Abnormal; Notable for the following:    Glucose-Capillary 223 (*)    All other components within normal limits  I-STAT CG4 LACTIC ACID, ED - Abnormal; Notable for the following:    Lactic Acid, Venous 2.27 (*)    All other components within normal limits  URINE CULTURE  LIPASE, BLOOD  POC URINE PREG, ED  I-STAT CG4 LACTIC ACID, ED    EKG  EKG Interpretation  Date/Time:  Friday January 20 2017 03:38:29 EDT Ventricular Rate:  125 PR Interval:    QRS Duration: 82 QT Interval:  301 QTC Calculation: 434 R Axis:   79 Text Interpretation:  Sinus tachycardia Ventricular premature complex Aberrant complex Borderline Q waves in inferior leads Borderline repolarization abnormality rate is faster when compared to  prior Confirmed by Bebe Shaggy  MD, Wrigley Winborne ([id number]) on 01/20/2017 3:57:22 AM       Radiology Dg Chest 2 View  Result Date: 01/20/2017 CLINICAL DATA:  31 year old female with cough EXAM: CHEST  2 VIEW COMPARISON:  Chest radiograph dated 02/23/2015 FINDINGS: There is shallow inspiration with minimal bibasilar platelike atelectasis. No focal consolidation, pleural effusion, or pneumothorax. The cardiac silhouette is within normal limits. No acute osseous pathology. IMPRESSION: No active cardiopulmonary disease. Electronically Signed   By: Elgie Collard M.D.   On: 01/20/2017 04:33   Ct Abdomen Pelvis W Contrast  Result Date: 01/20/2017 CLINICAL DATA:  31 year old female with left lower quadrant abdominal pain. EXAM: CT ABDOMEN AND PELVIS WITH CONTRAST TECHNIQUE: Multidetector CT imaging of the abdomen  and pelvis was performed using the standard protocol following bolus administration of intravenous contrast. CONTRAST:  ISOVUE-300 IOPAMIDOL (ISOVUE-300) INJECTION 61% COMPARISON:  None. FINDINGS: Lower chest: Bilateral lower lobe linear and patchy densities, more prominent on the left most likely represent atelectatic changes. Pneumonia is less likely. Clinical correlation is recommended. There is no intra-abdominal free air.  No free fluid identified. Hepatobiliary: Cholecystectomy. Diffuse fatty infiltration of the liver. No intrahepatic biliary ductal dilatation. Pancreas: Unremarkable. No pancreatic ductal dilatation or surrounding inflammatory changes. Spleen: Normal in size without focal abnormality. Adrenals/Urinary Tract: The adrenal glands are unremarkable. There multiple punctate nonobstructing right renal calculi measuring up to 3 mm in the inferior pole of the right kidney. There is mild right pelvicaliectasis. No definite obstructing stone identified. Findings may represent a recently passed right renal stone. The left kidney is unremarkable. The left ureter and urinary bladder appear  unremarkable as well. An ill-defined 19 x 8 mm exophytic area in the superior pole of the right kidney noted which is not well characterized. Although this may represent a complex cyst or a protrusion of renal parenchyma other lesions are not excluded. Further evaluation with ultrasound is recommended. Stomach/Bowel: There is loose stool within the colon compatible with diarrheal state. Correlation with clinical exam and stool cultures recommended. There is no evidence of bowel obstruction or active inflammation. Normal appendix. Vascular/Lymphatic: No significant vascular findings are present. No enlarged abdominal or pelvic lymph nodes. Reproductive: The uterus and ovaries are grossly unremarkable. Other: None Musculoskeletal: L4-S1 posterior fusion hardware. No acute fracture. IMPRESSION: 1. Diarrheal state. Correlation with clinical exam and stool cultures recommended. No bowel obstruction or active inflammation. Normal appendix. 2. Multiple small nonobstructing right renal calculi. Mild right pelvocaliectasis possibly related to a recently passed right renal stone. Correlation with urinalysis recommended to exclude UTI. 3. A small exophytic lesion from the superior pole of the right kidney is not characterized on this CT. Further evaluation with ultrasound recommended. 4. Fatty liver. Electronically Signed   By: Elgie Collard M.D.   On: 01/20/2017 06:45    Procedures Procedures  CRITICAL CARE Performed by: Joya Gaskins Total critical care time: 40 minutes Critical care time was exclusive of separately billable procedures and treating other patients. Critical care was necessary to treat or prevent imminent or life-threatening deterioration. Critical care was time spent personally by me on the following activities: development of treatment plan with patient and/or surrogate as well as nursing, discussions with consultants, evaluation of patient's response to treatment, examination of patient,  obtaining history from patient or surrogate, ordering and performing treatments and interventions, ordering and review of laboratory studies, ordering and review of radiographic studies, pulse oximetry and re-evaluation of patient's condition. PATIENT WITH PERSISTENT TACHYCARDIA (HR>130) AFTER 3 LITERS OF NORMAL SALINE.  PT REQUIRES ADMISSION AND MONITORING  Medications Ordered in ED Medications  sodium chloride 0.9 % bolus 1,000 mL (0 mLs Intravenous Stopped 01/20/17 0502)  ondansetron (ZOFRAN) injection 4 mg (4 mg Intravenous Given 01/20/17 0208)  fentaNYL (SUBLIMAZE) injection 50 mcg (50 mcg Intravenous Given 01/20/17 0242)  sodium chloride 0.9 % bolus 1,000 mL (0 mLs Intravenous Stopped 01/20/17 0502)  sodium chloride 0.9 % bolus 1,000 mL (0 mLs Intravenous Stopped 01/20/17 0552)  ondansetron (ZOFRAN) injection 4 mg (4 mg Intravenous Given 01/20/17 0449)  fentaNYL (SUBLIMAZE) injection 50 mcg (50 mcg Intravenous Given 01/20/17 0449)  fentaNYL (SUBLIMAZE) injection 50 mcg (50 mcg Intravenous Given 01/20/17 0530)  iopamidol (ISOVUE-300) 61 % injection 100 mL (100 mLs Intravenous  Contrast Given 01/20/17 0626)  sodium chloride 0.9 % bolus 1,000 mL (1,000 mLs Intravenous New Bag/Given 01/20/17 0708)     Initial Impression / Assessment and Plan / ED Course  I have reviewed the triage vital signs and the nursing notes.  Pertinent labs & imaging results that were available during my care of the patient were reviewed by me and considered in my medical decision making (see chart for details).     3:36 AM Pt with recent cough, placed on Zpack, now with vomiting/diarrhea She reports ABD pain improved She is still tachycardic and coughing frequently Will check CXR/EKG and lactate 5:25 AM Pt with continued tachycardic despite nearly 3L normal saline She now reports continued abd pain  - she has left sided abdominal tenderness Due to cough, CXR obtained but negative EKG reveals sinus tachycardic Due to  persistent ABD pain/tachycardia, will proceed with CT imaging 7:22 AM Ct imaging did not reveal obvious cause of pain ?passed kidney stone but urinalysis is negative ?atelectasis on imaging of lower chest Pt still tachycardic despite multiple liters of normal saline and lactate has improved Due to persistent sinus tachycardia, will admit D/w dr Kerry Houghmemon for admission He asked me to place holding orders   Final Clinical Impressions(s) / ED Diagnoses   Final diagnoses:  Nausea vomiting and diarrhea  Dehydration  Tachycardia  Hyperglycemia    New Prescriptions New Prescriptions   No medications on file     Zadie Rhineonald Armina Galloway, MD 01/20/17 [id number]

## 2017-01-21 LAB — URINE CULTURE: Culture: <10000 — AB

## 2017-01-21 LAB — HIV ANTIBODY (ROUTINE TESTING W REFLEX): HIV SCREEN 4TH GENERATION: NONREACTIVE

## 2017-04-10 ENCOUNTER — Emergency Department (HOSPITAL_COMMUNITY)
Admission: EM | Admit: 2017-04-10 | Discharge: 2017-04-10 | Disposition: A | Discharge status: Home / Self Care | Payer: Medicaid Other

## 2017-04-10 NOTE — ED Notes (Signed)
No answer when called to triage.

## 2017-04-10 NOTE — ED Notes (Signed)
No answer in waiting room X2, women's bathroom is empty,

## 2017-04-10 NOTE — ED Notes (Signed)
No answer in waiting room X1,  

## 2017-09-26 ENCOUNTER — Inpatient Hospital Stay (HOSPITAL_COMMUNITY)
Admission: EM | Admit: 2017-09-26 | Discharge: 2017-09-29 | DRG: 871 | Discharge status: Against Medical Advice | Payer: Medicaid Other | Attending: Nephrology | Admitting: Nephrology

## 2017-09-26 ENCOUNTER — Other Ambulatory Visit: Payer: Self-pay

## 2017-09-26 ENCOUNTER — Encounter (HOSPITAL_COMMUNITY): Payer: Self-pay | Admitting: Cardiology

## 2017-09-26 ENCOUNTER — Inpatient Hospital Stay (HOSPITAL_COMMUNITY): Payer: Medicaid Other

## 2017-09-26 ENCOUNTER — Emergency Department (HOSPITAL_COMMUNITY): Payer: Medicaid Other

## 2017-09-26 DIAGNOSIS — Z79899 Other long term (current) drug therapy: Secondary | ICD-10-CM | POA: Diagnosis not present

## 2017-09-26 DIAGNOSIS — N189 Chronic kidney disease, unspecified: Secondary | ICD-10-CM | POA: Diagnosis not present

## 2017-09-26 DIAGNOSIS — G894 Chronic pain syndrome: Secondary | ICD-10-CM

## 2017-09-26 DIAGNOSIS — R6521 Severe sepsis with septic shock: Secondary | ICD-10-CM | POA: Diagnosis present

## 2017-09-26 DIAGNOSIS — G8929 Other chronic pain: Secondary | ICD-10-CM | POA: Diagnosis not present

## 2017-09-26 DIAGNOSIS — E876 Hypokalemia: Secondary | ICD-10-CM | POA: Diagnosis present

## 2017-09-26 DIAGNOSIS — Z5321 Procedure and treatment not carried out due to patient leaving prior to being seen by health care provider: Secondary | ICD-10-CM | POA: Diagnosis not present

## 2017-09-26 DIAGNOSIS — A4151 Sepsis due to Escherichia coli [E. coli]: Secondary | ICD-10-CM | POA: Diagnosis present

## 2017-09-26 DIAGNOSIS — F419 Anxiety disorder, unspecified: Secondary | ICD-10-CM | POA: Diagnosis present

## 2017-09-26 DIAGNOSIS — M545 Low back pain: Secondary | ICD-10-CM | POA: Diagnosis present

## 2017-09-26 DIAGNOSIS — Z981 Arthrodesis status: Secondary | ICD-10-CM

## 2017-09-26 DIAGNOSIS — N3000 Acute cystitis without hematuria: Secondary | ICD-10-CM

## 2017-09-26 DIAGNOSIS — F329 Major depressive disorder, single episode, unspecified: Secondary | ICD-10-CM | POA: Diagnosis present

## 2017-09-26 DIAGNOSIS — K760 Fatty (change of) liver, not elsewhere classified: Secondary | ICD-10-CM | POA: Diagnosis not present

## 2017-09-26 DIAGNOSIS — R059 Cough, unspecified: Secondary | ICD-10-CM

## 2017-09-26 DIAGNOSIS — R0682 Tachypnea, not elsewhere classified: Secondary | ICD-10-CM

## 2017-09-26 DIAGNOSIS — N10 Acute tubulo-interstitial nephritis: Secondary | ICD-10-CM

## 2017-09-26 DIAGNOSIS — E282 Polycystic ovarian syndrome: Secondary | ICD-10-CM | POA: Diagnosis not present

## 2017-09-26 DIAGNOSIS — N390 Urinary tract infection, site not specified: Secondary | ICD-10-CM

## 2017-09-26 DIAGNOSIS — A419 Sepsis, unspecified organism: Secondary | ICD-10-CM | POA: Diagnosis not present

## 2017-09-26 DIAGNOSIS — Z992 Dependence on renal dialysis: Secondary | ICD-10-CM | POA: Diagnosis not present

## 2017-09-26 DIAGNOSIS — R739 Hyperglycemia, unspecified: Secondary | ICD-10-CM | POA: Diagnosis present

## 2017-09-26 DIAGNOSIS — I129 Hypertensive chronic kidney disease with stage 1 through stage 4 chronic kidney disease, or unspecified chronic kidney disease: Secondary | ICD-10-CM | POA: Diagnosis present

## 2017-09-26 DIAGNOSIS — E1122 Type 2 diabetes mellitus with diabetic chronic kidney disease: Secondary | ICD-10-CM | POA: Diagnosis present

## 2017-09-26 DIAGNOSIS — Z7984 Long term (current) use of oral hypoglycemic drugs: Secondary | ICD-10-CM

## 2017-09-26 DIAGNOSIS — B9620 Unspecified Escherichia coli [E. coli] as the cause of diseases classified elsewhere: Secondary | ICD-10-CM | POA: Diagnosis present

## 2017-09-26 DIAGNOSIS — I959 Hypotension, unspecified: Secondary | ICD-10-CM

## 2017-09-26 DIAGNOSIS — N186 End stage renal disease: Secondary | ICD-10-CM | POA: Diagnosis not present

## 2017-09-26 DIAGNOSIS — I10 Essential (primary) hypertension: Secondary | ICD-10-CM

## 2017-09-26 DIAGNOSIS — R062 Wheezing: Secondary | ICD-10-CM

## 2017-09-26 DIAGNOSIS — N1330 Unspecified hydronephrosis: Secondary | ICD-10-CM | POA: Diagnosis present

## 2017-09-26 DIAGNOSIS — R05 Cough: Secondary | ICD-10-CM

## 2017-09-26 DIAGNOSIS — E119 Type 2 diabetes mellitus without complications: Secondary | ICD-10-CM

## 2017-09-26 LAB — CBC WITH DIFFERENTIAL/PLATELET
BASOS PCT: 0 %
Basophils Absolute: 0 10*3/uL (ref 0.0–0.1)
EOS ABS: 0 10*3/uL (ref 0.0–0.7)
EOS PCT: 0 %
HCT: 44.3 % (ref 36.0–46.0)
Hemoglobin: 14.4 g/dL (ref 12.0–15.0)
Lymphocytes Relative: 5 %
Lymphs Abs: 1.3 10*3/uL (ref 0.7–4.0)
MCH: 30.2 pg (ref 26.0–34.0)
MCHC: 32.5 g/dL (ref 30.0–36.0)
MCV: 92.9 fL (ref 78.0–100.0)
MONO ABS: 1 10*3/uL (ref 0.1–1.0)
Monocytes Relative: 4 %
Neutro Abs: 23.7 10*3/uL — ABNORMAL HIGH (ref 1.7–7.7)
Neutrophils Relative %: 91 %
PLATELETS: 247 10*3/uL (ref 150–400)
RBC: 4.77 MIL/uL (ref 3.87–5.11)
RDW: 12.7 % (ref 11.5–15.5)
WBC: 26 10*3/uL — ABNORMAL HIGH (ref 4.0–10.5)

## 2017-09-26 LAB — CBG MONITORING, ED
GLUCOSE-CAPILLARY: 267 mg/dL — AB (ref 65–99)
Glucose-Capillary: 310 mg/dL — ABNORMAL HIGH (ref 65–99)

## 2017-09-26 LAB — HEPATIC FUNCTION PANEL
ALT: 21 U/L (ref 14–54)
AST: 24 U/L (ref 15–41)
Albumin: 3.4 g/dL — ABNORMAL LOW (ref 3.5–5.0)
Alkaline Phosphatase: 89 U/L (ref 38–126)
TOTAL PROTEIN: 7.6 g/dL (ref 6.5–8.1)
Total Bilirubin: 0.3 mg/dL (ref 0.3–1.2)

## 2017-09-26 LAB — BASIC METABOLIC PANEL
ANION GAP: 12 (ref 5–15)
BUN: 10 mg/dL (ref 6–20)
CHLORIDE: 99 mmol/L — AB (ref 101–111)
CO2: 20 mmol/L — ABNORMAL LOW (ref 22–32)
Calcium: 9.4 mg/dL (ref 8.9–10.3)
Creatinine, Ser: 0.8 mg/dL (ref 0.44–1.00)
Glucose, Bld: 393 mg/dL — ABNORMAL HIGH (ref 65–99)
POTASSIUM: 3.7 mmol/L (ref 3.5–5.1)
Sodium: 131 mmol/L — ABNORMAL LOW (ref 135–145)

## 2017-09-26 LAB — I-STAT CG4 LACTIC ACID, ED: LACTIC ACID, VENOUS: 4.41 mmol/L — AB (ref 0.5–1.9)

## 2017-09-26 LAB — BLOOD GAS, VENOUS
Acid-base deficit: 5.6 mmol/L — ABNORMAL HIGH (ref 0.0–2.0)
Bicarbonate: 20.5 mmol/L (ref 20.0–28.0)
FIO2: 21
O2 SAT: 96 %
PCO2 VEN: 27.4 mmHg — AB (ref 44.0–60.0)
pH, Ven: 7.431 — ABNORMAL HIGH (ref 7.250–7.430)
pO2, Ven: 76.4 mmHg — ABNORMAL HIGH (ref 32.0–45.0)

## 2017-09-26 LAB — URINALYSIS, ROUTINE W REFLEX MICROSCOPIC
Bilirubin Urine: NEGATIVE
Glucose, UA: >=500 mg/dL — AB
Ketones, ur: NEGATIVE mg/dL
NITRITE: NEGATIVE
PH: 6 (ref 5.0–8.0)
Protein, ur: 30 mg/dL — AB
SPECIFIC GRAVITY, URINE: 1.023 (ref 1.005–1.030)

## 2017-09-26 LAB — LACTIC ACID, PLASMA
LACTIC ACID, VENOUS: 3.5 mmol/L — AB (ref 0.5–1.9)
LACTIC ACID, VENOUS: 2.1 mmol/L — AB (ref 0.5–1.9)

## 2017-09-26 MED ORDER — DEXTROSE 5 % IV SOLN
1.0000 g | INTRAVENOUS | Status: DC
  Filled 2017-09-26: qty 10

## 2017-09-26 MED ORDER — SODIUM CHLORIDE 0.9 % IV SOLN
Freq: Once | INTRAVENOUS | Status: AC
  Administered 2017-09-26: 12:00:00 via INTRAVENOUS

## 2017-09-26 MED ORDER — LACTATED RINGERS IV BOLUS (SEPSIS)
1000.0000 mL | Freq: Once | INTRAVENOUS | Status: AC
  Administered 2017-09-26: 1000 mL via INTRAVENOUS

## 2017-09-26 MED ORDER — ONDANSETRON HCL 4 MG/2ML IJ SOLN
4.0000 mg | Freq: Once | INTRAMUSCULAR | Status: AC
  Administered 2017-09-26: 4 mg via INTRAVENOUS
  Filled 2017-09-26: qty 2

## 2017-09-26 MED ORDER — IBUPROFEN 800 MG PO TABS
800.0000 mg | ORAL_TABLET | Freq: Once | ORAL | Status: AC
  Administered 2017-09-26: 800 mg via ORAL
  Filled 2017-09-26: qty 1

## 2017-09-26 MED ORDER — FENTANYL CITRATE (PF) 100 MCG/2ML IJ SOLN
50.0000 ug | Freq: Once | INTRAMUSCULAR | Status: AC
  Administered 2017-09-27: 50 ug via INTRAVENOUS
  Filled 2017-09-26: qty 2

## 2017-09-26 MED ORDER — SODIUM CHLORIDE 0.9 % IV BOLUS (SEPSIS)
1000.0000 mL | Freq: Once | INTRAVENOUS | Status: AC
  Administered 2017-09-26: 1000 mL via INTRAVENOUS

## 2017-09-26 MED ORDER — SODIUM CHLORIDE 0.9 % IV BOLUS (SEPSIS): 500.0000 mL | Freq: Once | INTRAVENOUS | Status: DC

## 2017-09-26 MED ORDER — LORAZEPAM 2 MG/ML IJ SOLN
1.0000 mg | Freq: Once | INTRAMUSCULAR | Status: AC
  Administered 2017-09-26: 1 mg via INTRAVENOUS
  Filled 2017-09-26: qty 1

## 2017-09-26 MED ORDER — ONDANSETRON HCL 4 MG/2ML IJ SOLN
4.0000 mg | Freq: Four times a day (QID) | INTRAMUSCULAR | Status: DC | PRN
  Administered 2017-09-26 – 2017-09-29 (×7): 4 mg via INTRAVENOUS
  Filled 2017-09-26 (×6): qty 2

## 2017-09-26 MED ORDER — ACETAMINOPHEN 650 MG RE SUPP
650.0000 mg | Freq: Four times a day (QID) | RECTAL | Status: DC | PRN
Start: 2017-09-26 — End: 2017-09-27

## 2017-09-26 MED ORDER — KETOROLAC TROMETHAMINE 30 MG/ML IJ SOLN
30.0000 mg | Freq: Four times a day (QID) | INTRAMUSCULAR | Status: DC | PRN
Start: 2017-09-26 — End: 2017-09-29
  Administered 2017-09-26 – 2017-09-29 (×4): 30 mg via INTRAVENOUS
  Filled 2017-09-26 (×4): qty 1

## 2017-09-26 MED ORDER — INSULIN ASPART 100 UNIT/ML ~~LOC~~ SOLN
0.0000 [IU] | Freq: Three times a day (TID) | SUBCUTANEOUS | Status: DC
  Administered 2017-09-27: 3 [IU] via SUBCUTANEOUS
  Administered 2017-09-27 (×2): 2 [IU] via SUBCUTANEOUS
  Administered 2017-09-28: 3 [IU] via SUBCUTANEOUS
  Administered 2017-09-28: 2 [IU] via SUBCUTANEOUS
  Administered 2017-09-28: 3 [IU] via SUBCUTANEOUS
  Administered 2017-09-29: 1 [IU] via SUBCUTANEOUS

## 2017-09-26 MED ORDER — SENNOSIDES-DOCUSATE SODIUM 8.6-50 MG PO TABS
1.0000 | ORAL_TABLET | Freq: Every evening | ORAL | Status: DC | PRN
  Filled 2017-09-26: qty 1

## 2017-09-26 MED ORDER — CEFTRIAXONE SODIUM 2 G IJ SOLR
2.0000 g | Freq: Once | INTRAMUSCULAR | Status: AC
  Administered 2017-09-26: 2 g via INTRAVENOUS
  Filled 2017-09-26: qty 2

## 2017-09-26 MED ORDER — ACETAMINOPHEN 500 MG PO TABS
1000.0000 mg | ORAL_TABLET | Freq: Once | ORAL | Status: AC
  Administered 2017-09-26: 1000 mg via ORAL
  Filled 2017-09-26: qty 2

## 2017-09-26 MED ORDER — INSULIN ASPART 100 UNIT/ML ~~LOC~~ SOLN
6.0000 [IU] | Freq: Once | SUBCUTANEOUS | Status: AC
  Administered 2017-09-26: 6 [IU] via SUBCUTANEOUS

## 2017-09-26 MED ORDER — ONDANSETRON HCL 4 MG/2ML IJ SOLN
INTRAMUSCULAR | Status: AC
  Administered 2017-09-27: 4 mg via INTRAVENOUS
  Filled 2017-09-26: qty 2

## 2017-09-26 MED ORDER — INSULIN ASPART 100 UNIT/ML ~~LOC~~ SOLN
0.0000 [IU] | Freq: Every day | SUBCUTANEOUS | Status: DC
  Administered 2017-09-26: 3 [IU] via SUBCUTANEOUS
  Administered 2017-09-27: 2 [IU] via SUBCUTANEOUS
  Administered 2017-09-28: 3 [IU] via SUBCUTANEOUS

## 2017-09-26 MED ORDER — SODIUM CHLORIDE 0.9 % IV SOLN
INTRAVENOUS | Status: DC
  Administered 2017-09-26: 18:00:00 via INTRAVENOUS
  Administered 2017-09-26: 1000 mL via INTRAVENOUS
  Administered 2017-09-27 – 2017-09-29 (×4): via INTRAVENOUS

## 2017-09-26 MED ORDER — ACETAMINOPHEN 325 MG PO TABS
650.0000 mg | ORAL_TABLET | Freq: Four times a day (QID) | ORAL | Status: DC | PRN
Start: 2017-09-26 — End: 2017-09-27
  Administered 2017-09-27 (×3): 650 mg via ORAL
  Filled 2017-09-26 (×4): qty 2

## 2017-09-26 MED ORDER — SODIUM CHLORIDE 0.9 % IV SOLN
Freq: Once | INTRAVENOUS | Status: AC
  Administered 2017-09-26: 19:00:00 via INTRAVENOUS

## 2017-09-26 MED ORDER — OXYCODONE HCL 5 MG PO TABS
5.0000 mg | ORAL_TABLET | ORAL | Status: DC | PRN
  Administered 2017-09-27 – 2017-09-29 (×6): 5 mg via ORAL
  Filled 2017-09-26 (×6): qty 1

## 2017-09-26 NOTE — Progress Notes (Signed)
ANTIBIOTIC CONSULT NOTE - INITIAL  Pharmacy Consult for rocephin Indication: UTI  No Known Allergies  Patient Measurements: Height: 5\' 5"  (165.1 cm) Weight: 225 lb (102.1 kg) IBW/kg (Calculated) : 57   Vital Signs: Temp: 103.5 F (39.7 C) (11/27 1153) Temp Source: Rectal (11/27 1153) BP: 130/72 (11/27 1200) Pulse Rate: 122 (11/27 1200) Intake/Output from previous day: No intake/output data recorded. Intake/Output from this shift: Total I/O In: 1000 [I.V.:1000] Out: -   Labs: Recent Labs    09/26/17 1136  WBC 26.0*  HGB 14.4  PLT 247  CREATININE 0.80   Estimated Creatinine Clearance: 121.7 mL/min (by C-G formula based on SCr of 0.8 mg/dL). No results for input(s): VANCOTROUGH, VANCOPEAK, VANCORANDOM, GENTTROUGH, GENTPEAK, GENTRANDOM, TOBRATROUGH, TOBRAPEAK, TOBRARND, AMIKACINPEAK, AMIKACINTROU, AMIKACIN in the last 72 hours.   Microbiology: No results found for this or any previous visit (from the past 720 hour(s)).  Medical History: Past Medical History:  Diagnosis Date  . Anxiety   . Chronic lower back pain   . Constipation due to opioid therapy   . Depression 2014   hx; "resolved"  . Fatty liver   . Hyperlipidemia   . Polycystic ovarian syndrome   . PONV (postoperative nausea and vomiting)   . Type II diabetes mellitus (HCC) dx'd 2014    Medications:  See medication history Assessment: 31 yo lady to start rocephin for UTI.  Rocephin 2gm IV x 1 ordered in the ED  Goal of Therapy:  Eradication of infection  Plan:  Cont rocephin 1gm IV q24 hours Pharmacy will sign off.   Please advise if we can be of further assistance   Berry Godsey Poteet 09/26/2017,12:58 PM

## 2017-09-26 NOTE — ED Notes (Signed)
Pt states she feels nauseated at this time. To check orders for PRN medications.

## 2017-09-26 NOTE — ED Notes (Addendum)
Pt sleepy but easily wakes and oriented x 4. Dr. Nelson ChimesAmin notified of BP-ns 1000ml iv bolus ordered.

## 2017-09-26 NOTE — ED Triage Notes (Signed)
Fever since Sunday.  Pt had  epidural injection in back yesterday.  C/o lower back pain that radiates into both legs.

## 2017-09-26 NOTE — ED Notes (Signed)
Hospitalist paged, pt requesting some anxiety.

## 2017-09-26 NOTE — ED Provider Notes (Signed)
[facility] EMERGENCY DEPARTMENT Provider Note   CSN: [id number] Arrival date & time: 09/26/17  1118     History   Chief Complaint Chief Complaint  Patient presents with  . Back Pain    HPI Janice Wright is a 31 y.o. female.  HPI Pt has been having trouble with intermittent fevers over the past week.  She has been taking Tylenol with good relief.  She has noted some burning with urination.  She did not think too much of it initially because she was on her menstrual period.  She has a history of chronic back pain and was scheduled for an epidural yesterday.  She did have a fever yesterday morning prior to the procedure but it resolved with Tylenol.  She had the procedure without any difficulty.  Patient states since then she is.  Her mouth feels dry.  She has had high fevers.  She continues to have pain in the lower back that radiates down her legs but this is not new for her although it may be worse.  Patient denies any abdominal pain.  No vaginal discharge.  No cough. Past Medical History:  Diagnosis Date  . Anxiety   . Chronic lower back pain   . Constipation due to opioid therapy   . Depression 2014   hx; "resolved"  . Fatty liver   . Hyperlipidemia   . Polycystic ovarian syndrome   . PONV (postoperative nausea and vomiting)   . Type II diabetes mellitus (HCC) dx'd 2014    Patient Active Problem List   Diagnosis Date Noted  . Dehydration 01/20/2017  . Bronchitis, acute 01/20/2017  . Gastroenteritis 01/20/2017  . Tachycardia 01/20/2017  . Type 2 diabetes mellitus with complication, without long-term current use of insulin (HCC) 01/20/2017  . Chronic pain syndrome 01/20/2017  . Radiculopathy 04/01/2015    Past Surgical History:  Procedure Laterality Date  . BACK SURGERY     x 2  . CESAREAN SECTION  2011  . KNEE ARTHROSCOPY Left 2007  . LAPAROSCOPIC CHOLECYSTECTOMY  2006  . LUMBAR DISC SURGERY  08/2008; 12/2010  . POSTERIOR LUMBAR FUSION  10/2012  . POSTERIOR  LUMBAR FUSION  04/01/2015   revision/notes 04/01/2015  . WISDOM TOOTH EXTRACTION     "2 @ a time"    OB History    No data available       Home Medications    Prior to Admission medications   Medication Sig Start Date End Date Taking? Authorizing Provider  diazepam (VALIUM) 10 MG tablet Take 10 mg by mouth every 6 (six) hours as needed for anxiety.   Yes [provider]  glipiZIDE (GLUCOTROL) 10 MG tablet Take 10 mg by mouth daily before breakfast.   Yes [provider]  lisinopril (PRINIVIL,ZESTRIL) 2.5 MG tablet Take 2.5 mg by mouth daily.   Yes [provider]  metFORMIN (GLUCOPHAGE) 500 MG tablet Take 500 mg by mouth 2 (two) times daily with a meal.    Yes [provider]    Family History History reviewed. No pertinent family history.  Social History Social History   Tobacco Use  . Smoking status: Never Smoker  . Smokeless tobacco: Never Used  Substance Use Topics  . Alcohol use: No  . Drug use: No     Allergies   Patient has no known allergies.   Review of Systems Review of Systems   Physical Exam Updated Vital Signs BP 112/62   Pulse (!) 139   Temp (!)  102.1 F (38.9 C) (Oral)   Resp (!) 24   Ht 1.651 m (5\' 5" )   Wt 102.1 kg (225 lb)   LMP 09/25/2017   SpO2 93%   BMI 37.44 kg/m   Physical Exam  HENT:  Head: Normocephalic and atraumatic.  Right Ear: External ear normal.  Left Ear: External ear normal.  Eyes: Conjunctivae are normal. Right eye exhibits no discharge. Left eye exhibits no discharge. No scleral icterus.  Neck: Neck supple. No tracheal deviation present.  Cardiovascular: Regular rhythm and intact distal pulses. Tachycardia present.  Pulmonary/Chest: Effort normal and breath sounds normal. No stridor. No respiratory distress. She has no wheezes. She has no rales.  Abdominal: Soft. Bowel sounds are normal. She exhibits no distension. There is no tenderness. There is no rebound and no guarding.    Musculoskeletal: She exhibits no edema or tenderness.  Lower back s/p epidural, no erythema, no drainage, no focal ttp  Neurological: She is alert. She has normal strength. No cranial nerve deficit (no facial droop, extraocular movements intact, no slurred speech) or sensory deficit. She exhibits normal muscle tone. She displays no seizure activity. Coordination normal.  Skin: Skin is warm and dry. No rash noted. She is not diaphoretic.  Psychiatric: She has a normal mood and affect.  Nursing note and vitals reviewed.    ED Treatments / Results  Labs (all labs ordered are listed, but only abnormal results are displayed) Labs Reviewed  CBC WITH DIFFERENTIAL/PLATELET - Abnormal; Notable for the following components:      Result Value   WBC 26.0 (*)    Neutro Abs 23.7 (*)    All other components within normal limits  BASIC METABOLIC PANEL - Abnormal; Notable for the following components:   Sodium 131 (*)    Chloride 99 (*)    CO2 20 (*)    Glucose, Bld 393 (*)    All other components within normal limits  LACTIC ACID, PLASMA - Abnormal; Notable for the following components:   Lactic Acid, Venous 3.5 (*)    All other components within normal limits  LACTIC ACID, PLASMA - Abnormal; Notable for the following components:   Lactic Acid, Venous 2.1 (*)    All other components within normal limits  HEPATIC FUNCTION PANEL - Abnormal; Notable for the following components:   Albumin 3.4 (*)    Bilirubin, Direct <0.1 (*)    All other components within normal limits  URINALYSIS, ROUTINE W REFLEX MICROSCOPIC - Abnormal; Notable for the following components:   APPearance CLOUDY (*)    Glucose, UA >=500 (*)    Hgb urine dipstick SMALL (*)    Protein, ur 30 (*)    Leukocytes, UA MODERATE (*)    Bacteria, UA RARE (*)    Squamous Epithelial / LPF 0-5 (*)    All other components within normal limits  I-STAT CG4 LACTIC ACID, ED - Abnormal; Notable for the following components:   Lactic Acid,  Venous 4.41 (*)    All other components within normal limits  CULTURE, BLOOD (ROUTINE X 2)  CULTURE, BLOOD (ROUTINE X 2)  URINE CULTURE  BLOOD GAS, VENOUS  I-STAT CG4 LACTIC ACID, ED    EKG  EKG Interpretation  Date/Time:  Tuesday September 26 2017 11:34:16 EST Ventricular Rate:  138 PR Interval:    QRS Duration: 78 QT Interval:  180 QTC Calculation: 273 R Axis:   69 Text Interpretation:  Sinus tachycardia Ventricular bigeminy Borderline ST depression, diffuse leads Baseline wander in  lead(s) V5 V6 No significant change since last tracing Confirmed by Linwood Dibbles [id number]) on 09/26/2017 1:46:35 PM       Radiology Dg Chest Port 1 View  Result Date: 09/26/2017 CLINICAL DATA:  Fever, headache, body aches. EXAM: PORTABLE CHEST 1 VIEW COMPARISON:  01/20/2017 FINDINGS: Cardiomediastinal silhouette is normal. Mediastinal contours appear intact. There is no evidence of focal airspace consolidation, pleural effusion or pneumothorax. Low lung volumes. Osseous structures are without acute abnormality. Soft tissues are grossly normal. IMPRESSION: Low lung volumes with bibasilar atelectasis. Electronically Signed   By: Ted Mcalpine M.D.   On: 09/26/2017 12:46    Procedures .Critical Care Performed by: Linwood Dibbles, MD Authorized by: Linwood Dibbles, MD   Critical care provider statement:    Critical care time (minutes):  45   Critical care was necessary to treat or prevent imminent or life-threatening deterioration of the following conditions:  Sepsis   Critical care was time spent personally by me on the following activities:  Discussions with consultants, evaluation of patient's response to treatment, examination of patient, ordering and performing treatments and interventions, ordering and review of laboratory studies, ordering and review of radiographic studies, pulse oximetry, re-evaluation of patient's condition, obtaining history from patient or surrogate and review of old charts    (including critical care time)  Medications Ordered in ED Medications  cefTRIAXone (ROCEPHIN) 1 g in dextrose 5 % 50 mL IVPB (not administered)  lactated ringers bolus 1,000 mL (not administered)  insulin aspart (novoLOG) injection 6 Units (not administered)  ondansetron (ZOFRAN) injection 4 mg (4 mg Intravenous Given 09/26/17 1150)  acetaminophen (TYLENOL) tablet 1,000 mg (1,000 mg Oral Given 09/26/17 1150)  0.9 %  sodium chloride infusion ( Intravenous Stopped 09/26/17 1242)  sodium chloride 0.9 % bolus 1,000 mL (1,000 mLs Intravenous New Bag/Given 09/26/17 1421)    And  sodium chloride 0.9 % bolus 1,000 mL (0 mLs Intravenous Stopped 09/26/17 1448)    And  sodium chloride 0.9 % bolus 1,000 mL (0 mLs Intravenous Stopped 09/26/17 1421)  cefTRIAXone (ROCEPHIN) 2 g in dextrose 5 % 50 mL IVPB (0 g Intravenous Stopped 09/26/17 1351)  ibuprofen (ADVIL,MOTRIN) tablet 800 mg (800 mg Oral Given 09/26/17 1421)     Initial Impression / Assessment and Plan / ED Course  I have reviewed the triage vital signs and the nursing notes.  Pertinent labs & imaging results that were available during my care of the patient were reviewed by me and considered in my medical decision making (see chart for details).  Clinical Course as of Sep 26 1534  Tue Sep 26, 2017  1348 Labs notable for elevated lactic acid level, and leukocytosis.  [JK]  1402 Empiric abx and fluid ordered per sepsis protocol.  Suspect urinary source right now and less likely related to her hx of back pain and spinal injection yesterday since she was having sx before the procedure.   [JK]  1532 Blood pressure is stable however the patient remains tachypneic and tachycardic.  Her blood sugar is elevated.  Her bicarb is not significantly decreased.  I am concerned about the possibility of DKA in addition to the sepsis I will add on a VBG.  [JK]    Clinical Course User Index [JK] Linwood Dibbles, MD    Patient presented to the emergency room for  evaluation of symptoms concerning for sepsis.  Patient had been having fevers off and on for the past week.  She has had some urinary symptoms as well.  Patient had chronic back pain issues with radiculopathy but I do not think that is associated with her acute infection today.  Her urinalysis is consistent with a urinary tract infection.  She has a leukocytosis as well as lactic acidosis.  Patient has been started on IV antibiotics.  She has been given fluid boluses per sepsis protocol.  She still remains tachycardic and tachypneic.  I will add on a venous blood gas to assess for metabolic acidosis.  I will consult the medical service for admission and further treatment.  Final Clinical Impressions(s) / ED Diagnoses   Final diagnoses:  Sepsis, due to unspecified organism (HCC)  Acute cystitis without hematuria      Linwood DibblesKnapp, Marissah Vandemark, MD 09/26/17 1535

## 2017-09-26 NOTE — H&P (Signed)
History and Physical    Janice Wright:[id number] DOB: 07/20/1986 DOA: 09/26/2017  PCP: Rebecka ApleyHemberg, Katherine V, NP Patient coming from: Home  Chief Complaint: Fever  HPI: Janice Wright is a 31 y.o. female with medical history significant of chronic low back pain, cholecystectomy, diabetes type 2, anxiety came to the ER with complains of fevers and urinary burning.  Patient states 3 weeks ago she noticed that she was having low-grade temperature and some urinary burning but at that time she ignored it and did not receive any medical attention as she thought it got better.  2 days ago she started experiencing severe urinary burning with subjective high fevers, chills and Rigors.  She took Tylenol and her scheduled dose of Valium which helped her fever and felt okay the following day but again yesterday evening she started having same symptoms again and it persisted this morning therefore came to the ER.  She denies having previous history of kidney stones, urinary tract infection.  In the ER she was noted to be borderline hypotensive but significant sinus tachycardia with heart rate in 140s.  Her lactate was 3.5, WBC 26 with elevated neutrophils and left shift.  Her UA was suggestive of severe UTI.  She was started on sepsis protocol and given 3 L of IV fluids along with IV Rocephin.  It was determined to admit her for further care.   Review of Systems: As per HPI otherwise 10 point review of systems negative.   Past Medical History:  Diagnosis Date  . Anxiety   . Chronic lower back pain   . Constipation due to opioid therapy   . Depression 2014   hx; "resolved"  . Fatty liver   . Hyperlipidemia   . Polycystic ovarian syndrome   . PONV (postoperative nausea and vomiting)   . Type II diabetes mellitus (HCC) dx'd 2014    Past Surgical History:  Procedure Laterality Date  . BACK SURGERY     x 2  . CESAREAN SECTION  2011  . KNEE ARTHROSCOPY Left 2007  . LAPAROSCOPIC CHOLECYSTECTOMY   2006  . LUMBAR DISC SURGERY  08/2008; 12/2010  . POSTERIOR LUMBAR FUSION  10/2012  . POSTERIOR LUMBAR FUSION  04/01/2015   revision/notes 04/01/2015  . WISDOM TOOTH EXTRACTION     "2 @ a time"     reports that  has never smoked. she has never used smokeless tobacco. She reports that she does not drink alcohol or use drugs.  No Known Allergies  History reviewed. No pertinent family history.  Acceptable: Family history reviewed and not pertinent (If you reviewed it)  Prior to Admission medications   Medication Sig Start Date End Date Taking? Authorizing Provider  diazepam (VALIUM) 10 MG tablet Take 10 mg by mouth every 6 (six) hours as needed for anxiety.   Yes [provider]  glipiZIDE (GLUCOTROL) 10 MG tablet Take 10 mg by mouth daily before breakfast.   Yes [provider]  lisinopril (PRINIVIL,ZESTRIL) 2.5 MG tablet Take 2.5 mg by mouth daily.   Yes [provider]  metFORMIN (GLUCOPHAGE) 500 MG tablet Take 500 mg by mouth 2 (two) times daily with a meal.    Yes [provider]    Physical Exam: Vitals:   09/26/17 1500 09/26/17 1550 09/26/17 1600 09/26/17 1611  BP: 112/62  (!) 101/57   Pulse: (!) 139  (!) 128   Resp: (!) 24  (!) 30   Temp:  (!) 101.1 F (38.4 C)  TempSrc:  Oral    SpO2: 93%  93% 94%  Weight:      Height:          Constitutional: Ill-appearing Vitals:   09/26/17 1500 09/26/17 1550 09/26/17 1600 09/26/17 1611  BP: 112/62  (!) 101/57   Pulse: (!) 139  (!) 128   Resp: (!) 24  (!) 30   Temp:  (!) 101.1 F (38.4 C)    TempSrc:  Oral    SpO2: 93%  93% 94%  Weight:      Height:       Eyes: PERRL, lids and conjunctivae normal ENMT: Mucous membranes are dry. Posterior pharynx clear of any exudate or lesions.Normal dentition.  Neck: normal, supple, no masses, no thyromegaly Respiratory: clear to auscultation bilaterally, no wheezing, no crackles. Normal respiratory effort. No accessory muscle use.  Cardiovascular:  Tachycardia, regular rate and rhythm, no murmurs / rubs / gallops. No extremity edema. 2+ pedal pulses. No carotid bruits.  Abdomen: Right-sided CVA tenderness, no masses palpated. No hepatosplenomegaly. Bowel sounds positive.  Musculoskeletal: no clubbing / cyanosis. No joint deformity upper and lower extremities. Good ROM, no contractures. Normal muscle tone.  Skin: no rashes, lesions, ulcers. No induration Neurologic: CN 2-12 grossly intact. Sensation intact, DTR normal. Strength 5/5 in all 4.  Psychiatric: Normal judgment and insight. Alert and oriented x 3. Normal mood.     Labs on Admission: I have personally reviewed following labs and imaging studies  CBC: Recent Labs  Lab 09/26/17 1136  WBC 26.0*  NEUTROABS 23.7*  HGB 14.4  HCT 44.3  MCV 92.9  PLT 247   Basic Metabolic Panel: Recent Labs  Lab 09/26/17 1136  NA 131*  K 3.7  CL 99*  CO2 20*  GLUCOSE 393*  BUN 10  CREATININE 0.80  CALCIUM 9.4   GFR: Estimated Creatinine Clearance: 121.7 mL/min (by C-G formula based on SCr of 0.8 mg/dL). Liver Function Tests: Recent Labs  Lab 09/26/17 1136  AST 24  ALT 21  ALKPHOS 89  BILITOT 0.3  PROT 7.6  ALBUMIN 3.4*   No results for input(s): LIPASE, AMYLASE in the last 168 hours. No results for input(s): AMMONIA in the last 168 hours. Coagulation Profile: No results for input(s): INR, PROTIME in the last 168 hours. Cardiac Enzymes: No results for input(s): CKTOTAL, CKMB, CKMBINDEX, TROPONINI in the last 168 hours. BNP (last 3 results) No results for input(s): PROBNP in the last 8760 hours. HbA1C: No results for input(s): HGBA1C in the last 72 hours. CBG: No results for input(s): GLUCAP in the last 168 hours. Lipid Profile: No results for input(s): CHOL, HDL, LDLCALC, TRIG, CHOLHDL, LDLDIRECT in the last 72 hours. Thyroid Function Tests: No results for input(s): TSH, T4TOTAL, FREET4, T3FREE, THYROIDAB in the last 72 hours. Anemia Panel: No results for  input(s): VITAMINB12, FOLATE, FERRITIN, TIBC, IRON, RETICCTPCT in the last 72 hours. Urine analysis:    Component Value Date/Time   COLORURINE YELLOW 09/26/2017 1247   APPEARANCEUR CLOUDY (A) 09/26/2017 1247   LABSPEC 1.023 09/26/2017 1247   PHURINE 6.0 09/26/2017 1247   GLUCOSEU >=500 (A) 09/26/2017 1247   HGBUR SMALL (A) 09/26/2017 1247   BILIRUBINUR NEGATIVE 09/26/2017 1247   KETONESUR NEGATIVE 09/26/2017 1247   PROTEINUR 30 (A) 09/26/2017 1247   UROBILINOGEN 0.2 03/23/2015 0930   NITRITE NEGATIVE 09/26/2017 1247   LEUKOCYTESUR MODERATE (A) 09/26/2017 1247   Sepsis Labs: !!!!!!!!!!!!!!!!!!!!!!!!!!!!!!!!!!!!!!!!!!!! @LABRCNTIP (procalcitonin:4,lacticidven:4) ) Recent Results (from the past 240 hour(s))  Blood culture (routine x 2)  Status: None (Preliminary result)   Collection Time: 09/26/17 12:15 PM  Result Value Ref Range Status   Specimen Description BLOOD RIGHT HAND  Final   Special Requests   Final    BOTTLES DRAWN AEROBIC AND ANAEROBIC Blood Culture results may not be optimal due to an inadequate volume of blood received in culture bottles   Culture PENDING  Incomplete   Report Status PENDING  Incomplete     Radiological Exams on Admission: Dg Chest Port 1 View  Result Date: 09/26/2017 CLINICAL DATA:  Fever, headache, body aches. EXAM: PORTABLE CHEST 1 VIEW COMPARISON:  01/20/2017 FINDINGS: Cardiomediastinal silhouette is normal. Mediastinal contours appear intact. There is no evidence of focal airspace consolidation, pleural effusion or pneumothorax. Low lung volumes. Osseous structures are without acute abnormality. Soft tissues are grossly normal. IMPRESSION: Low lung volumes with bibasilar atelectasis. Electronically Signed   By: Ted Mcalpineobrinka  Dimitrova M.D.   On: 09/26/2017 12:46    EKG: Independently reviewed. No acute ST T changes.   Assessment/Plan Active Problems:   Sepsis (HCC)    Septic shock Urinary tract infection concerning for pyelonephritis Lactic  acidosis - Admit to the stepdown unit for closer monitoring -Sepsis protocol initiated, after 3 L of fluid her blood pressure and heart rate has improved -Cultures have been drawn, UA suggestive of UTI -Aggressively continue IV fluids normal saline at 150 cc/h, monitor urine output - Tylenol if necessary for fever -No need for pressors at this time.  Will get CT of the abdomen pelvis without contrast to ensure nothing else is going on - Continue IV Rocephin, if deteriorates or fails to improve-we will broaden her antibiotic coverage -Trend lactate until negative -Hold off on antihypertensives  Chronic back pain from multiple surgeries - Continue outpatient pain medication regimen  Essential hypertension - Antihypertensives on hold secondary to septic shock  Diabetes type 2 - Accu-Cheks and sliding scale    DVT prophylaxis: Early ambulation  Code Status: Full  Family Communication: Fiance at bedside  Disposition Plan: To be determined Consults called: None Admission status: Stepdown for closer monitoring   Khilynn Borntreger Joline Maxcyhirag Kaiyden Simkin MD [facility]   If 7PM-7AM, please contact night-coverage [email or web address] Password [email or web address]  09/26/2017, 5:21 PM

## 2017-09-26 NOTE — Progress Notes (Signed)
Night shift floor coverage now.  Received sign out from Dr. Nelson ChimesAmin to transfer to SDU bed whenever available. [facility]  Arrangements made for transfer.  When seen, the patient was still tachycardic, but stated she felt a lot better than when she arrived to the emergency department.  Discussed briefly with urology (Dr. Marlou PorchHerrick), who recommended to continue IV fluids and IV antibiotics, since there is no urolith causing the hydronephrosis.  Most likely hydronephrosis is from local inflammation and stranding.  Sanda Kleinavid Leanny Moeckel, MD

## 2017-09-27 ENCOUNTER — Inpatient Hospital Stay (HOSPITAL_COMMUNITY): Payer: Medicaid Other

## 2017-09-27 DIAGNOSIS — I959 Hypotension, unspecified: Secondary | ICD-10-CM

## 2017-09-27 DIAGNOSIS — N186 End stage renal disease: Secondary | ICD-10-CM

## 2017-09-27 DIAGNOSIS — Z992 Dependence on renal dialysis: Secondary | ICD-10-CM

## 2017-09-27 DIAGNOSIS — E1122 Type 2 diabetes mellitus with diabetic chronic kidney disease: Secondary | ICD-10-CM

## 2017-09-27 DIAGNOSIS — N10 Acute tubulo-interstitial nephritis: Secondary | ICD-10-CM

## 2017-09-27 LAB — BLOOD CULTURE ID PANEL (REFLEXED)

## 2017-09-27 LAB — CBC
HCT: 37.4 % (ref 36.0–46.0)
HEMOGLOBIN: 12.2 g/dL (ref 12.0–15.0)
MCH: 30 pg (ref 26.0–34.0)
MCHC: 32.6 g/dL (ref 30.0–36.0)
MCV: 92.1 fL (ref 78.0–100.0)
Platelets: 183 10*3/uL (ref 150–400)
RBC: 4.06 MIL/uL (ref 3.87–5.11)
RDW: 13 % (ref 11.5–15.5)
WBC: 14.9 10*3/uL — AB (ref 4.0–10.5)

## 2017-09-27 LAB — COMPREHENSIVE METABOLIC PANEL
ALBUMIN: 2.1 g/dL — AB (ref 3.5–5.0)
ALK PHOS: 68 U/L (ref 38–126)
ALT: 18 U/L (ref 14–54)
ANION GAP: 3 — AB (ref 5–15)
AST: 21 U/L (ref 15–41)
BILIRUBIN TOTAL: 0.4 mg/dL (ref 0.3–1.2)
BUN: 8 mg/dL (ref 6–20)
CALCIUM: 7.3 mg/dL — AB (ref 8.9–10.3)
CO2: 21 mmol/L — ABNORMAL LOW (ref 22–32)
Chloride: 113 mmol/L — ABNORMAL HIGH (ref 101–111)
Creatinine, Ser: 0.65 mg/dL (ref 0.44–1.00)
GFR calc Af Amer: >60 mL/min (ref >60)
GLUCOSE: 227 mg/dL — AB (ref 65–99)
Potassium: 3.6 mmol/L (ref 3.5–5.1)
Sodium: 137 mmol/L (ref 135–145)
TOTAL PROTEIN: 5.2 g/dL — AB (ref 6.5–8.1)

## 2017-09-27 LAB — HEPATIC FUNCTION PANEL
ALK PHOS: 80 U/L (ref 38–126)
ALT: 19 U/L (ref 14–54)
AST: 25 U/L (ref 15–41)
Albumin: 2.8 g/dL — ABNORMAL LOW (ref 3.5–5.0)
Total Bilirubin: 0.1 mg/dL — ABNORMAL LOW (ref 0.3–1.2)
Total Protein: 6.4 g/dL — ABNORMAL LOW (ref 6.5–8.1)

## 2017-09-27 LAB — BASIC METABOLIC PANEL
Anion gap: 10 (ref 5–15)
BUN: 9 mg/dL (ref 6–20)
CALCIUM: 7.8 mg/dL — AB (ref 8.9–10.3)
CO2: 18 mmol/L — ABNORMAL LOW (ref 22–32)
CREATININE: 0.77 mg/dL (ref 0.44–1.00)
Chloride: 106 mmol/L (ref 101–111)
Glucose, Bld: 279 mg/dL — ABNORMAL HIGH (ref 65–99)
Potassium: 4.1 mmol/L (ref 3.5–5.1)
SODIUM: 134 mmol/L — AB (ref 135–145)

## 2017-09-27 LAB — GLUCOSE, CAPILLARY
Glucose-Capillary: 197 mg/dL — ABNORMAL HIGH (ref 65–99)
Glucose-Capillary: 221 mg/dL — ABNORMAL HIGH (ref 65–99)
Glucose-Capillary: 170 mg/dL — ABNORMAL HIGH (ref 65–99)
Glucose-Capillary: 218 mg/dL — ABNORMAL HIGH (ref 65–99)

## 2017-09-27 LAB — LACTIC ACID, PLASMA: LACTIC ACID, VENOUS: 1.5 mmol/L (ref 0.5–1.9)

## 2017-09-27 LAB — MRSA PCR SCREENING: MRSA BY PCR: NEGATIVE

## 2017-09-27 LAB — MAGNESIUM: MAGNESIUM: 1.4 mg/dL — AB (ref 1.7–2.4)

## 2017-09-27 MED ORDER — DIAZEPAM 5 MG PO TABS
5.0000 mg | ORAL_TABLET | Freq: Once | ORAL | Status: AC
  Administered 2017-09-27: 5 mg via ORAL
  Filled 2017-09-27: qty 1

## 2017-09-27 MED ORDER — ENOXAPARIN SODIUM 40 MG/0.4ML ~~LOC~~ SOLN
40.0000 mg | SUBCUTANEOUS | Status: DC
  Administered 2017-09-27 – 2017-09-28 (×2): 40 mg via SUBCUTANEOUS
  Filled 2017-09-27 (×2): qty 0.4

## 2017-09-27 MED ORDER — ACETAMINOPHEN 650 MG RE SUPP: 650.0000 mg | Freq: Four times a day (QID) | RECTAL | Status: DC | PRN

## 2017-09-27 MED ORDER — METOPROLOL TARTRATE 5 MG/5ML IV SOLN
INTRAVENOUS | Status: AC
  Filled 2017-09-27: qty 5

## 2017-09-27 MED ORDER — DEXTROSE 5 % IV SOLN
2.0000 g | INTRAVENOUS | Status: DC
  Administered 2017-09-27 – 2017-09-28 (×2): 2 g via INTRAVENOUS
  Filled 2017-09-27 (×3): qty 2

## 2017-09-27 MED ORDER — METOPROLOL TARTRATE 5 MG/5ML IV SOLN
5.0000 mg | Freq: Once | INTRAVENOUS | Status: AC
  Administered 2017-09-27: 5 mg via INTRAVENOUS
  Filled 2017-09-27: qty 5

## 2017-09-27 MED ORDER — GUAIFENESIN ER 600 MG PO TB12
1200.0000 mg | ORAL_TABLET | Freq: Two times a day (BID) | ORAL | Status: DC
  Administered 2017-09-27 – 2017-09-29 (×4): 1200 mg via ORAL
  Filled 2017-09-27 (×4): qty 2

## 2017-09-27 MED ORDER — DEXTROSE 5 % IV SOLN
500.0000 mg | INTRAVENOUS | Status: DC
  Administered 2017-09-27 – 2017-09-28 (×2): 500 mg via INTRAVENOUS
  Filled 2017-09-27 (×2): qty 500

## 2017-09-27 MED ORDER — SODIUM CHLORIDE 0.9 % IV BOLUS (SEPSIS)
1000.0000 mL | Freq: Once | INTRAVENOUS | Status: AC
  Administered 2017-09-27: 1000 mL via INTRAVENOUS

## 2017-09-27 MED ORDER — LEVALBUTEROL HCL 0.63 MG/3ML IN NEBU: 0.6300 mg | INHALATION_SOLUTION | Freq: Four times a day (QID) | RESPIRATORY_TRACT | Status: DC | PRN

## 2017-09-27 MED ORDER — ACETAMINOPHEN 500 MG PO TABS
1000.0000 mg | ORAL_TABLET | Freq: Four times a day (QID) | ORAL | Status: DC | PRN
  Administered 2017-09-27 – 2017-09-29 (×4): 1000 mg via ORAL
  Filled 2017-09-27 (×4): qty 2

## 2017-09-27 MED ORDER — METOPROLOL TARTRATE 5 MG/5ML IV SOLN
5.0000 mg | Freq: Once | INTRAVENOUS | Status: AC
  Administered 2017-09-27: 5 mg via INTRAVENOUS

## 2017-09-27 MED ORDER — DIAZEPAM 5 MG PO TABS
5.0000 mg | ORAL_TABLET | Freq: Four times a day (QID) | ORAL | Status: AC | PRN
  Administered 2017-09-28 (×2): 5 mg via ORAL
  Filled 2017-09-27 (×2): qty 1

## 2017-09-27 NOTE — ED Notes (Signed)
Date and time results received: 09/27/17 0020 (use smartphrase ".now" to insert current time)  Test: anaerobic blood culture  Critical Value: gram negative rod  Name of Provider Notified: Robb MatarOrtiz  Orders Received? Or Actions Taken?:

## 2017-09-27 NOTE — Care Management Note (Signed)
Case Management Note  Patient Details  Name: Janice Wright MRN: [id number] Date of Birth: 10-16-1986  Subjective/Objective:                  hydronephrosis  Action/Plan: Date: September 27, 2017 Marcelle SmilingRhonda Izabellah Dadisman, BSN, [place]3, [place]CCM  [phone] Chart and notes review for patient progress and needs. Will follow for case management and discharge needs. Next review date: [id number]  Expected Discharge Date:                  Expected Discharge Plan:  Home/Self Care  In-House Referral:     Discharge planning Services  CM Consult  Post Acute Care Choice:    Choice offered to:     DME Arranged:    DME Agency:     HH Arranged:    HH Agency:     Status of Service:  In process, will continue to follow  If discussed at Long Length of Stay Meetings, dates discussed:    Additional Comments:  Golda AcreDavis, Deegan Valentino Lynn, RN 09/27/2017, 8:44 AM

## 2017-09-27 NOTE — Progress Notes (Signed)
Pt HR sustaining in 140's. Pt not complaining of any chest pain. Stated her HR runs between 120-140 daily. Paged MD and rec'd orders to give 5mg  Metoprolol, as well as give 5mg  more of Metoprolol in 30 mins if HR had not improved. Will continue to monitor.

## 2017-09-27 NOTE — Progress Notes (Signed)
PROGRESS NOTE    Janice Wright  NWG:[id number] DOB: 1986-07-26 DOA: 09/26/2017 PCP: Rebecka Apley, NP   Brief Narrative: 31 year old female with history of chronic lower back pain, cholecystectomy, type 2 diabetes, anxiety presented to the ER with fever, dysuria not improving with Tylenol and supportive care at home per about a week.  In the ER patient was found to be hypotensive, tachycardic, lactic acid level of 3.5 with a white blood cell count of 26,000 consistent with sepsis related with UTI.  Assessment & Plan:   #Severe sepsis due to UTI/right-sided pyelonephritis: -Patient was hypotensive to 80s this morning.  Treated with 1 L of IV fluid with improvement in blood pressure.  I increased the dose of ceftriaxone to 2 g.  Continue to monitor blood pressure, labs in a stepdown unit.  Patient reported clinically improving. -Lactic acid level improved, leukocytosis trending down. -Recommended patient to follow-up with urologist for the management of nonobstetric calculi.  Mild right hydronephrosis and hydroureter likely in the setting of pyelonephritis.  #Gram-negative rod bacteremia in the setting of pyelonephritis: On ceftriaxone.  Clinically improving.  Continue antibiotics.  #Chronic lower back pain: Pain is controlled continue supportive care.  #Type 2 diabetes: Continue sliding scale.  Monitor blood sugar level.  Change to carb modified diet.  #History of essential hypertension: Antihypertensive on hold because of hypotension related with sepsis.  DVT prophylaxis: Lovenox subcutaneous Code Status: Full code Family Communication: No family at bedside Disposition Plan: Currently stepdown unit    Consultants:   None  Procedures: None Antimicrobials: Ceftriaxone  Subjective: Seen and examined at bedside.  Denies headache, dizziness.  Reports nausea, weakness, chronic back pain, dysuria, frequency.  Denies chest pain, shortness of breath and reported that overall  feels better than yesterday.  Objective: Vitals:   09/27/17 0800 09/27/17 0900 09/27/17 1000 09/27/17 1012  BP: 97/67 117/68 (!) 124/53   Pulse: (!) 111 (!) 112 (!) 111 (!) 108  Resp: 20 (!) 31 (!) 26 (!) 24  Temp: 98.2 F (36.8 C)     TempSrc: Oral     SpO2: 97% 98% 97% 97%  Weight:      Height:        Intake/Output Summary (Last 24 hours) at 09/27/2017 1101 Last data filed at 09/27/2017 1000 Gross per 24 hour  Intake 7500 ml  Output 2850 ml  Net 4650 ml   Filed Weights   09/26/17 1127 09/27/17 0300  Weight: 102.1 kg (225 lb) 108 kg (238 lb 1.6 oz)    Examination:  General exam: Appears calm and comfortable  Respiratory system: Clear to auscultation. Respiratory effort normal. No wheezing or crackle Cardiovascular system: S1 & S2 heard, RRR.  No pedal edema. Gastrointestinal system: Abdomen is nondistended, soft and nontender. Normal bowel sounds heard.  Right CVA tenderness. Central nervous system: Alert and oriented. No focal neurological deficits. Extremities: Symmetric 5 x 5 power. Skin: No rashes, lesions or ulcers Psychiatry: Judgement and insight appear normal. Mood & affect appropriate.     Data Reviewed: I have personally reviewed following labs and imaging studies  CBC: Recent Labs  Lab 09/26/17 1136 09/27/17 0825  WBC 26.0* 14.9*  NEUTROABS 23.7*  --   HGB 14.4 12.2  HCT 44.3 37.4  MCV 92.9 92.1  PLT 247 183   Basic Metabolic Panel: Recent Labs  Lab 09/26/17 1136 09/27/17 0003 09/27/17 0610  NA 131* 134* 137  K 3.7 4.1 3.6  CL 99* 106 113*  CO2 20* 18* 21*  GLUCOSE 393* 279* 227*  BUN 10 9 8   CREATININE 0.80 0.77 0.65  CALCIUM 9.4 7.8* 7.3*  MG  --  1.4*  --    GFR: Estimated Creatinine Clearance: 125.6 mL/min (by C-G formula based on SCr of 0.65 mg/dL). Liver Function Tests: Recent Labs  Lab 09/26/17 1136 09/27/17 0003 09/27/17 0610  AST 24 25 21   ALT 21 19 18   ALKPHOS 89 80 68  BILITOT 0.3 0.1* 0.4  PROT 7.6 6.4* 5.2*    ALBUMIN 3.4* 2.8* 2.1*   No results for input(s): LIPASE, AMYLASE in the last 168 hours. No results for input(s): AMMONIA in the last 168 hours. Coagulation Profile: No results for input(s): INR, PROTIME in the last 168 hours. Cardiac Enzymes: No results for input(s): CKTOTAL, CKMB, CKMBINDEX, TROPONINI in the last 168 hours. BNP (last 3 results) No results for input(s): PROBNP in the last 8760 hours. HbA1C: No results for input(s): HGBA1C in the last 72 hours. CBG: Recent Labs  Lab 09/26/17 1810 09/26/17 2230 09/27/17 0750  GLUCAP 310* 267* 197*   Lipid Profile: No results for input(s): CHOL, HDL, LDLCALC, TRIG, CHOLHDL, LDLDIRECT in the last 72 hours. Thyroid Function Tests: No results for input(s): TSH, T4TOTAL, FREET4, T3FREE, THYROIDAB in the last 72 hours. Anemia Panel: No results for input(s): VITAMINB12, FOLATE, FERRITIN, TIBC, IRON, RETICCTPCT in the last 72 hours. Sepsis Labs: Recent Labs  Lab 09/26/17 1136 09/26/17 1235 09/26/17 1324 09/27/17 0825  LATICACIDVEN 3.5* 4.41* 2.1* 1.5    Recent Results (from the past 240 hour(s))  Blood culture (routine x 2)     Status: None (Preliminary result)   Collection Time: 09/26/17 11:36 AM  Result Value Ref Range Status   Specimen Description RIGHT ANTECUBITAL DRAWN BY RN  Final   Special Requests   Final    BOTTLES DRAWN AEROBIC AND ANAEROBIC Blood Culture adequate volume   Culture  Setup Time   Final    GRAM NEGATIVE RODS IN BOTH AEROBIC AND ANAEROBIC BOTTLES Gram Stain Report Called to,Read Back By and Verified With: HAYMORE, R @ 0021 BY MATTHEWS, B 11.28.18 [facility] CRITICAL RESULT CALLED TO, READ BACK BY AND VERIFIED WITH: PHARMD A PHAM [phone] MLM Performed at [facility], [address] [place], [place] [address]    Culture GRAM NEGATIVE RODS  Final   Report Status PENDING  Incomplete  Blood Culture ID Panel (Reflexed)     Status: Abnormal   Collection Time: 09/26/17 11:36 AM  Result  Value Ref Range Status   Enterococcus species NOT DETECTED NOT DETECTED Final   Listeria monocytogenes NOT DETECTED NOT DETECTED Final   Staphylococcus species NOT DETECTED NOT DETECTED Final   Staphylococcus aureus NOT DETECTED NOT DETECTED Final   Streptococcus species NOT DETECTED NOT DETECTED Final   Streptococcus agalactiae NOT DETECTED NOT DETECTED Final   Streptococcus pneumoniae NOT DETECTED NOT DETECTED Final   Streptococcus pyogenes NOT DETECTED NOT DETECTED Final   Acinetobacter baumannii NOT DETECTED NOT DETECTED Final   Enterobacteriaceae species DETECTED (A) NOT DETECTED Final    Comment: Enterobacteriaceae represent a large family of gram-negative bacteria, not a single organism. CRITICAL RESULT CALLED TO, READ BACK BY AND VERIFIED WITH: PHARMD A PHAM [phone] MLM    Enterobacter cloacae complex NOT DETECTED NOT DETECTED Final   Escherichia coli DETECTED (A) NOT DETECTED Final    Comment: CRITICAL RESULT CALLED TO, READ BACK BY AND VERIFIED WITH: PHARMD A PHAM [phone] MLM  Klebsiella oxytoca NOT DETECTED NOT DETECTED Final   Klebsiella pneumoniae NOT DETECTED NOT DETECTED Final   Proteus species NOT DETECTED NOT DETECTED Final   Serratia marcescens NOT DETECTED NOT DETECTED Final   Carbapenem resistance NOT DETECTED NOT DETECTED Final   Haemophilus influenzae NOT DETECTED NOT DETECTED Final   Neisseria meningitidis NOT DETECTED NOT DETECTED Final   Pseudomonas aeruginosa NOT DETECTED NOT DETECTED Final   Candida albicans NOT DETECTED NOT DETECTED Final   Candida glabrata NOT DETECTED NOT DETECTED Final   Candida krusei NOT DETECTED NOT DETECTED Final   Candida parapsilosis NOT DETECTED NOT DETECTED Final   Candida tropicalis NOT DETECTED NOT DETECTED Final    Comment: Performed at [facility], [address] [place], [place] [address]  Blood culture (routine x 2)     Status: None (Preliminary result)   Collection Time: 09/26/17 12:15 PM  Result  Value Ref Range Status   Specimen Description BLOOD RIGHT HAND  Final   Special Requests   Final    BOTTLES DRAWN AEROBIC AND ANAEROBIC Blood Culture results may not be optimal due to an inadequate volume of blood received in culture bottles   Culture  Setup Time   Final    GRAM NEGATIVE RODS IN BOTH AEROBIC AND ANAEROBIC BOTTLES Gram Stain Report Called to,Read Back By and Verified With: MCFARLAND, M (Gem) @ 0245 BY MATTHEWS B 11.28.18  HOS[facility] IDENTIFICATION TO FOLLOW Performed at [facility], [address] [place], [place] [address]    Culture NO GROWTH < 24 HOURS  Final   Report Status PENDING  Incomplete  MRSA PCR Screening     Status: None   Collection Time: 09/27/17  7:38 AM  Result Value Ref Range Status   MRSA by PCR NEGATIVE NEGATIVE Final    Comment:        The GeneXpert MRSA Assay (FDA approved for NASAL specimens only), is one component of a comprehensive MRSA colonization surveillance program. It is not intended to diagnose MRSA infection nor to guide or monitor treatment for MRSA infections.          Radiology Studies: Ct Abdomen Pelvis Wo Contrast  Result Date: 09/26/2017 CLINICAL DATA:  Low back pain radiating to both legs, fever since Sunday, had epidural injection in back yesterday, nausea, vomiting, urinary tract calculus known symptomatic EXAM: CT ABDOMEN AND PELVIS WITHOUT CONTRAST TECHNIQUE: Multidetector CT imaging of the abdomen and pelvis was performed following the standard protocol without IV contrast. Sagittal and coronal MPR images reconstructed from axial data set. Oral contrast was not administered for this indication. COMPARISON:  01/20/2017 FINDINGS: Lower chest: Subsegmental atelectasis at both lung bases. Hepatobiliary: Gallbladder surgically absent. No focal hepatic abnormalities evident. Pancreas: Normal appearance Spleen: Normal appearance Adrenals/Urinary Tract: Adrenal glands normal appearance. LEFT kidney  unremarkable. Mild RIGHT hydronephrosis and perinephric edema. Tiny nonobstructing calculus at lower pole RIGHT kidney. Mild dilatation of RIGHT ureter into the pelvis with periureteral edema. No definite ureteral calculus is identified. Bladder is decompressed without obvious calcification. Stomach/Bowel: Normal appendix. Stomach and bowel loops unremarkable for technique. Vascular/Lymphatic: Aorta normal caliber.  No adenopathy. Reproductive: Unremarkable uterus and adnexa Other: No free air or free fluid. Tiny umbilical hernia containing fat. Musculoskeletal: Prior L4-S1 fusion. No acute osseous findings. No definite new soft tissue abnormalities identified dorsal to the lumbar spine. IMPRESSION: Mild RIGHT hydronephrosis and hydroureter with perinephric and periureteral stranding. These changes could reflect urinary tract infection or passed calculus. A tiny nonobstructing  calculus is seen at the inferior pole the RIGHT kidney but no ureteral calcification is identified ; recommend correlation with urinalysis. Tiny umbilical hernia containing fat. Electronically Signed   By: Ulyses SouthwardMark  Boles M.D.   On: 09/26/2017 18:05   Dg Chest Port 1 View  Result Date: 09/26/2017 CLINICAL DATA:  Fever, headache, body aches. EXAM: PORTABLE CHEST 1 VIEW COMPARISON:  01/20/2017 FINDINGS: Cardiomediastinal silhouette is normal. Mediastinal contours appear intact. There is no evidence of focal airspace consolidation, pleural effusion or pneumothorax. Low lung volumes. Osseous structures are without acute abnormality. Soft tissues are grossly normal. IMPRESSION: Low lung volumes with bibasilar atelectasis. Electronically Signed   By: Ted Mcalpineobrinka  Dimitrova M.D.   On: 09/26/2017 12:46        Scheduled Meds: . insulin aspart  0-5 Units Subcutaneous QHS  . insulin aspart  0-9 Units Subcutaneous TID WC   Continuous Infusions: . sodium chloride 150 mL/hr at 09/27/17 1000  . cefTRIAXone (ROCEPHIN)  IV       LOS: 1 day     Keyron Pokorski Jaynie CollinsPrasad Azelia Reiger, MD [facility] Pager [phone]  If 7PM-7AM, please contact night-coverage [email or web address] Password [email or web address] 09/27/2017, 11:01 AM

## 2017-09-27 NOTE — Progress Notes (Signed)
PHARMACY - PHYSICIAN COMMUNICATION CRITICAL VALUE ALERT - BLOOD CULTURE IDENTIFICATION (BCID)  Results for orders placed or performed during the hospital encounter of 09/26/17  Blood Culture ID Panel (Reflexed) (Collected: 09/26/2017 11:36 AM)  Result Value Ref Range   Enterococcus species NOT DETECTED NOT DETECTED   Listeria monocytogenes NOT DETECTED NOT DETECTED   Staphylococcus species NOT DETECTED NOT DETECTED   Staphylococcus aureus NOT DETECTED NOT DETECTED   Streptococcus species NOT DETECTED NOT DETECTED   Streptococcus agalactiae NOT DETECTED NOT DETECTED   Streptococcus pneumoniae NOT DETECTED NOT DETECTED   Streptococcus pyogenes NOT DETECTED NOT DETECTED   Acinetobacter baumannii NOT DETECTED NOT DETECTED   Enterobacteriaceae species DETECTED (A) NOT DETECTED   Enterobacter cloacae complex NOT DETECTED NOT DETECTED   Escherichia coli DETECTED (A) NOT DETECTED   Klebsiella oxytoca NOT DETECTED NOT DETECTED   Klebsiella pneumoniae NOT DETECTED NOT DETECTED   Proteus species NOT DETECTED NOT DETECTED   Serratia marcescens NOT DETECTED NOT DETECTED   Carbapenem resistance NOT DETECTED NOT DETECTED   Haemophilus influenzae NOT DETECTED NOT DETECTED   Neisseria meningitidis NOT DETECTED NOT DETECTED   Pseudomonas aeruginosa NOT DETECTED NOT DETECTED   Candida albicans NOT DETECTED NOT DETECTED   Candida glabrata NOT DETECTED NOT DETECTED   Candida krusei NOT DETECTED NOT DETECTED   Candida parapsilosis NOT DETECTED NOT DETECTED   Candida tropicalis NOT DETECTED NOT DETECTED    Name of physician (or Provider) Contacted: Dr Ronalee BeltsBhandari  Changes to prescribed antibiotics required: None.  Continue Ceftriaxone 2g IV q24h for UTI, sepsis, bacteremia.  Lynann Beaverhristine Damier Disano PharmD, BCPS Pager [phone] 09/27/2017 7:46 AM

## 2017-09-28 ENCOUNTER — Inpatient Hospital Stay (HOSPITAL_COMMUNITY): Payer: Medicaid Other

## 2017-09-28 DIAGNOSIS — N3000 Acute cystitis without hematuria: Secondary | ICD-10-CM

## 2017-09-28 DIAGNOSIS — R739 Hyperglycemia, unspecified: Secondary | ICD-10-CM

## 2017-09-28 DIAGNOSIS — A4151 Sepsis due to Escherichia coli [E. coli]: Principal | ICD-10-CM

## 2017-09-28 LAB — ECHOCARDIOGRAM COMPLETE
HEIGHTINCHES: 65 in
Weight: 3933.01 oz

## 2017-09-28 LAB — URINE CULTURE

## 2017-09-28 LAB — BASIC METABOLIC PANEL
Anion gap: 6 (ref 5–15)
BUN: 6 mg/dL (ref 6–20)
CALCIUM: 7.9 mg/dL — AB (ref 8.9–10.3)
CO2: 20 mmol/L — ABNORMAL LOW (ref 22–32)
Chloride: 111 mmol/L (ref 101–111)
Creatinine, Ser: 0.58 mg/dL (ref 0.44–1.00)
GFR calc Af Amer: >60 mL/min (ref >60)
GLUCOSE: 184 mg/dL — AB (ref 65–99)
Potassium: 3.9 mmol/L (ref 3.5–5.1)
SODIUM: 137 mmol/L (ref 135–145)

## 2017-09-28 LAB — CBC
HCT: 40.3 % (ref 36.0–46.0)
Hemoglobin: 13.7 g/dL (ref 12.0–15.0)
MCH: 31.4 pg (ref 26.0–34.0)
MCHC: 34 g/dL (ref 30.0–36.0)
MCV: 92.4 fL (ref 78.0–100.0)
PLATELETS: 158 10*3/uL (ref 150–400)
RBC: 4.36 MIL/uL (ref 3.87–5.11)
RDW: 13.3 % (ref 11.5–15.5)
WBC: 14.6 10*3/uL — AB (ref 4.0–10.5)

## 2017-09-28 LAB — GLUCOSE, CAPILLARY
GLUCOSE-CAPILLARY: 225 mg/dL — AB (ref 65–99)
Glucose-Capillary: 225 mg/dL — ABNORMAL HIGH (ref 65–99)
Glucose-Capillary: 181 mg/dL — ABNORMAL HIGH (ref 65–99)
Glucose-Capillary: 279 mg/dL — ABNORMAL HIGH (ref 65–99)

## 2017-09-28 LAB — T4, FREE: Free T4: 0.69 ng/dL (ref 0.61–1.12)

## 2017-09-28 LAB — TSH: TSH: 0.507 u[IU]/mL (ref 0.350–4.500)

## 2017-09-28 MED ORDER — METOPROLOL TARTRATE 12.5 MG HALF TABLET
12.5000 mg | ORAL_TABLET | Freq: Two times a day (BID) | ORAL | Status: DC
  Administered 2017-09-28 – 2017-09-29 (×3): 12.5 mg via ORAL
  Filled 2017-09-28 (×3): qty 1

## 2017-09-28 MED ORDER — INSULIN GLARGINE 100 UNIT/ML ~~LOC~~ SOLN
5.0000 [IU] | Freq: Every day | SUBCUTANEOUS | Status: DC
  Administered 2017-09-28: 5 [IU] via SUBCUTANEOUS
  Filled 2017-09-28: qty 0.05

## 2017-09-28 MED ORDER — MAGNESIUM SULFATE 2 GM/50ML IV SOLN
2.0000 g | Freq: Once | INTRAVENOUS | Status: AC
  Administered 2017-09-28: 2 g via INTRAVENOUS
  Filled 2017-09-28: qty 50

## 2017-09-28 NOTE — Progress Notes (Signed)
PROGRESS NOTE  Janice Wright ZOX:[id number] DOB: [birth year] DOA: 09/26/2017 PCP: Rebecka ApleyHemberg, Katherine V, NP   LOS: 2 days   Brief Narrative / Interim history: 31 year old female with history of chronic lower back pain, cholecystectomy, type 2 diabetes, anxiety presented to the ER with fever, dysuria not improving with Tylenol and supportive care at home per about a week.  In the ER patient was found to be hypotensive, tachycardic, lactic acid level of 3.5 with a white blood cell count of 26,000 consistent with sepsis related with UTI.  Assessment & Plan: Active Problems:   Type 2 diabetes mellitus with chronic kidney disease on chronic dialysis, without long-term current use of insulin (HCC)   Sepsis (HCC)   Sepsis secondary to UTI (HCC)   Acute pyelonephritis   Hypotension   Severe sepsis due to UTI/right-sided pyelonephritis with E. coli bacteremia -Continue ceftriaxone, E. coli is sensitive to Ceftriaxone  -Still febrile, continue IV antibiotics -Leukocytosis improving -We will need outpatient follow-up with urology for her nonobstructive calculi.  Ultrasound with mild right hydronephrosis and hydroureter likely in the setting of pyelonephritis  Chronic low back pain -Controlled, had steroid injections as an outpatient  Type 2 diabetes mellitus -Continue sliding scale, add low-dose Lantus.  Check an A1c as we have none in the system  Sinus tachycardia -Patient states is chronic, her heart rate runs anywhere between 120 and 140 for long time.  Obtain TSH/free T3/free T4.  She is asymptomatic -echo for completeness -low dose BB   DVT prophylaxis: Lovenox Code Status: Full code Family Communication: No family at bedside Disposition Plan: Home when ready  Consultants:   None  Procedures:   None   Antimicrobials:  Ceftriaxone 11/28 >>   Subjective: - no chest pain, shortness of breath, no abdominal pain, nausea or vomiting.   Objective: Vitals:   09/28/17 0400  09/28/17 0800 09/28/17 0900 09/28/17 1000  BP: (!) 156/93 (!) 149/82    Pulse: (!) 140 (!) 146 (!) 134 (!) 133  Resp:      Temp:  98.1 F (36.7 C)    TempSrc:  Oral    SpO2: 97% 94% 96% 94%  Weight:      Height:        Intake/Output Summary (Last 24 hours) at 09/28/2017 1235 Last data filed at 09/28/2017 1200 Gross per 24 hour  Intake 4200 ml  Output 500 ml  Net 3700 ml   Filed Weights   09/26/17 1127 09/27/17 0300 09/28/17 0132  Weight: 102.1 kg (225 lb) 108 kg (238 lb 1.6 oz) 111.5 kg (245 lb 13 oz)    Examination:  Constitutional: NAD Eyes:  lids and conjunctivae normal ENMT: Mucous membranes are moist.  Respiratory: clear to auscultation bilaterally, no wheezing, no crackles.  Cardiovascular: Regular rate and rhythm, no murmurs / rubs / gallops. No LE edema.  Abdomen: no tenderness. Bowel sounds positive.  Neurologic: non focal   Data Reviewed: I have independently reviewed following labs and imaging studies   CBC: Recent Labs  Lab 09/26/17 1136 09/27/17 0825 09/28/17 0346  WBC 26.0* 14.9* 14.6*  NEUTROABS 23.7*  --   --   HGB 14.4 12.2 13.7  HCT 44.3 37.4 40.3  MCV 92.9 92.1 92.4  PLT 247 183 158   Basic Metabolic Panel: Recent Labs  Lab 09/26/17 1136 09/27/17 0003 09/27/17 0610 09/28/17 0346  NA 131* 134* 137 137  K 3.7 4.1 3.6 3.9  CL 99* 106 113* 111  CO2 20* 18* 21* 20*  GLUCOSE 393* 279* 227* 184*  BUN 10 9 8 6   CREATININE 0.80 0.77 0.65 0.58  CALCIUM 9.4 7.8* 7.3* 7.9*  MG  --  1.4*  --   --    GFR: Estimated Creatinine Clearance: 127.9 mL/min (by C-G formula based on SCr of 0.58 mg/dL). Liver Function Tests: Recent Labs  Lab 09/26/17 1136 09/27/17 0003 09/27/17 0610  AST 24 25 21   ALT 21 19 18   ALKPHOS 89 80 68  BILITOT 0.3 0.1* 0.4  PROT 7.6 6.4* 5.2*  ALBUMIN 3.4* 2.8* 2.1*   No results for input(s): LIPASE, AMYLASE in the last 168 hours. No results for input(s): AMMONIA in the last 168 hours. Coagulation Profile: No  results for input(s): INR, PROTIME in the last 168 hours. Cardiac Enzymes: No results for input(s): CKTOTAL, CKMB, CKMBINDEX, TROPONINI in the last 168 hours. BNP (last 3 results) No results for input(s): PROBNP in the last 8760 hours. HbA1C: No results for input(s): HGBA1C in the last 72 hours. CBG: Recent Labs  Lab 09/27/17 1203 09/27/17 1608 09/27/17 2125 09/28/17 0808 09/28/17 1133  GLUCAP 221* 170* 218* 225* 225*   Lipid Profile: No results for input(s): CHOL, HDL, LDLCALC, TRIG, CHOLHDL, LDLDIRECT in the last 72 hours. Thyroid Function Tests: Recent Labs    09/28/17 0918  TSH 0.507   Anemia Panel: No results for input(s): VITAMINB12, FOLATE, FERRITIN, TIBC, IRON, RETICCTPCT in the last 72 hours. Urine analysis:    Component Value Date/Time   COLORURINE YELLOW 09/26/2017 1247   APPEARANCEUR CLOUDY (A) 09/26/2017 1247   LABSPEC 1.023 09/26/2017 1247   PHURINE 6.0 09/26/2017 1247   GLUCOSEU >=500 (A) 09/26/2017 1247   HGBUR SMALL (A) 09/26/2017 1247   BILIRUBINUR NEGATIVE 09/26/2017 1247   KETONESUR NEGATIVE 09/26/2017 1247   PROTEINUR 30 (A) 09/26/2017 1247   UROBILINOGEN 0.2 03/23/2015 0930   NITRITE NEGATIVE 09/26/2017 1247   LEUKOCYTESUR MODERATE (A) 09/26/2017 1247   Sepsis Labs: Invalid input(s): PROCALCITONIN, LACTICIDVEN  Recent Results (from the past 240 hour(s))  Blood culture (routine x 2)     Status: Abnormal (Preliminary result)   Collection Time: 09/26/17 11:36 AM  Result Value Ref Range Status   Specimen Description RIGHT ANTECUBITAL DRAWN BY RN  Final   Special Requests   Final    BOTTLES DRAWN AEROBIC AND ANAEROBIC Blood Culture adequate volume   Culture  Setup Time   Final    GRAM NEGATIVE RODS IN BOTH AEROBIC AND ANAEROBIC BOTTLES Gram Stain Report Called to,Read Back By and Verified With: HAYMORE, R @ 0021 BY MATTHEWS, B 11.28.18 [facility] CRITICAL RESULT CALLED TO, READ BACK BY AND VERIFIED WITH: PHARMD A PHAM [id number] MLM      Culture (A)  Final    ESCHERICHIA COLI SUSCEPTIBILITIES TO FOLLOW Performed at [facility], [address] [place], [place] [id number]    Report Status PENDING  Incomplete  Blood Culture ID Panel (Reflexed)     Status: Abnormal   Collection Time: 09/26/17 11:36 AM  Result Value Ref Range Status   Enterococcus species NOT DETECTED NOT DETECTED Final   Listeria monocytogenes NOT DETECTED NOT DETECTED Final   Staphylococcus species NOT DETECTED NOT DETECTED Final   Staphylococcus aureus NOT DETECTED NOT DETECTED Final   Streptococcus species NOT DETECTED NOT DETECTED Final   Streptococcus agalactiae NOT DETECTED NOT DETECTED Final   Streptococcus pneumoniae NOT DETECTED NOT DETECTED Final   Streptococcus pyogenes NOT DETECTED NOT DETECTED Final   Acinetobacter baumannii  NOT DETECTED NOT DETECTED Final   Enterobacteriaceae species DETECTED (A) NOT DETECTED Final    Comment: Enterobacteriaceae represent a large family of gram-negative bacteria, not a single organism. CRITICAL RESULT CALLED TO, READ BACK BY AND VERIFIED WITH: PHARMD A PHAM [id number] MLM    Enterobacter cloacae complex NOT DETECTED NOT DETECTED Final   Escherichia coli DETECTED (A) NOT DETECTED Final    Comment: CRITICAL RESULT CALLED TO, READ BACK BY AND VERIFIED WITH: PHARMD A PHAM [id number] MLM    Klebsiella oxytoca NOT DETECTED NOT DETECTED Final   Klebsiella pneumoniae NOT DETECTED NOT DETECTED Final   Proteus species NOT DETECTED NOT DETECTED Final   Serratia marcescens NOT DETECTED NOT DETECTED Final   Carbapenem resistance NOT DETECTED NOT DETECTED Final   Haemophilus influenzae NOT DETECTED NOT DETECTED Final   Neisseria meningitidis NOT DETECTED NOT DETECTED Final   Pseudomonas aeruginosa NOT DETECTED NOT DETECTED Final   Candida albicans NOT DETECTED NOT DETECTED Final   Candida glabrata NOT DETECTED NOT DETECTED Final   Candida krusei NOT DETECTED NOT DETECTED Final   Candida parapsilosis NOT  DETECTED NOT DETECTED Final   Candida tropicalis NOT DETECTED NOT DETECTED Final    Comment: Performed at [facility], [address] [place], [place] [address]  Blood culture (routine x 2)     Status: None (Preliminary result)   Collection Time: 09/26/17 12:15 PM  Result Value Ref Range Status   Specimen Description BLOOD RIGHT HAND  Final   Special Requests   Final    BOTTLES DRAWN AEROBIC AND ANAEROBIC Blood Culture results may not be optimal due to an inadequate volume of blood received in culture bottles   Culture  Setup Time   Final    GRAM NEGATIVE RODS IN BOTH AEROBIC AND ANAEROBIC BOTTLES Gram Stain Report Called to,Read Back By and Verified With: MCFARLAND, M (Linford Arnold) @ 0245 BY MATTHEWS B 11.28.18 [facility] Performed at [facility], [address] [place], [place] [address]    Culture GRAM NEGATIVE RODS  Final   Report Status PENDING  Incomplete  Urine culture     Status: Abnormal   Collection Time: 09/26/17 12:26 PM  Result Value Ref Range Status   Specimen Description URINE, RANDOM  Final   Special Requests NONE  Final   Culture >=100,000 COLONIES/mL ESCHERICHIA COLI (A)  Final   Report Status 09/28/2017 FINAL  Final   Organism ID, Bacteria ESCHERICHIA COLI (A)  Final      Susceptibility   Escherichia coli - MIC*    AMPICILLIN >=32 RESISTANT Resistant     CEFAZOLIN <=4 SENSITIVE Sensitive     CEFTRIAXONE <=1 SENSITIVE Sensitive     CIPROFLOXACIN <=0.25 SENSITIVE Sensitive     GENTAMICIN <=1 SENSITIVE Sensitive     IMIPENEM <=0.25 SENSITIVE Sensitive     NITROFURANTOIN <=16 SENSITIVE Sensitive     TRIMETH/SULFA <=20 SENSITIVE Sensitive     AMPICILLIN/SULBACTAM 8 SENSITIVE Sensitive     PIP/TAZO <=4 SENSITIVE Sensitive     Extended ESBL NEGATIVE Sensitive     * >=100,000 COLONIES/mL ESCHERICHIA COLI  MRSA PCR Screening     Status: None   Collection Time: 09/27/17  7:38 AM  Result Value Ref Range Status   MRSA by PCR NEGATIVE NEGATIVE  Final    Comment:        The GeneXpert MRSA Assay (FDA approved for NASAL specimens only), is one component of a comprehensive MRSA colonization surveillance program.  It is not intended to diagnose MRSA infection nor to guide or monitor treatment for MRSA infections.       Radiology Studies: Ct Abdomen Pelvis Wo Contrast  Result Date: 09/26/2017 CLINICAL DATA:  Low back pain radiating to both legs, fever since Sunday, had epidural injection in back yesterday, nausea, vomiting, urinary tract calculus known symptomatic EXAM: CT ABDOMEN AND PELVIS WITHOUT CONTRAST TECHNIQUE: Multidetector CT imaging of the abdomen and pelvis was performed following the standard protocol without IV contrast. Sagittal and coronal MPR images reconstructed from axial data set. Oral contrast was not administered for this indication. COMPARISON:  01/20/2017 FINDINGS: Lower chest: Subsegmental atelectasis at both lung bases. Hepatobiliary: Gallbladder surgically absent. No focal hepatic abnormalities evident. Pancreas: Normal appearance Spleen: Normal appearance Adrenals/Urinary Tract: Adrenal glands normal appearance. LEFT kidney unremarkable. Mild RIGHT hydronephrosis and perinephric edema. Tiny nonobstructing calculus at lower pole RIGHT kidney. Mild dilatation of RIGHT ureter into the pelvis with periureteral edema. No definite ureteral calculus is identified. Bladder is decompressed without obvious calcification. Stomach/Bowel: Normal appendix. Stomach and bowel loops unremarkable for technique. Vascular/Lymphatic: Aorta normal caliber.  No adenopathy. Reproductive: Unremarkable uterus and adnexa Other: No free air or free fluid. Tiny umbilical hernia containing fat. Musculoskeletal: Prior L4-S1 fusion. No acute osseous findings. No definite new soft tissue abnormalities identified dorsal to the lumbar spine. IMPRESSION: Mild RIGHT hydronephrosis and hydroureter with perinephric and periureteral stranding. These  changes could reflect urinary tract infection or passed calculus. A tiny nonobstructing calculus is seen at the inferior pole the RIGHT kidney but no ureteral calcification is identified ; recommend correlation with urinalysis. Tiny umbilical hernia containing fat. Electronically Signed   By: Ulyses Southward M.D.   On: 09/26/2017 18:05   Dg Chest Port 1 View  Result Date: 09/27/2017 CLINICAL DATA:  Wheezing, tachypnea EXAM: PORTABLE CHEST 1 VIEW COMPARISON:  09/26/2017 FINDINGS: Increased bibasilar streaky airspace opacities concerning for bibasilar pneumonia/atelectasis compare to yesterday. Normal heart size and vascularity. Upper lobes remain clear. No effusion or pneumothorax. Trachea is midline. IMPRESSION: Worsening bibasilar airspace process concerning for pneumonia/atelectasis. Electronically Signed   By: Judie Petit.  Shick M.D.   On: 09/27/2017 20:18   Dg Chest Port 1 View  Result Date: 09/26/2017 CLINICAL DATA:  Fever, headache, body aches. EXAM: PORTABLE CHEST 1 VIEW COMPARISON:  01/20/2017 FINDINGS: Cardiomediastinal silhouette is normal. Mediastinal contours appear intact. There is no evidence of focal airspace consolidation, pleural effusion or pneumothorax. Low lung volumes. Osseous structures are without acute abnormality. Soft tissues are grossly normal. IMPRESSION: Low lung volumes with bibasilar atelectasis. Electronically Signed   By: Ted Mcalpine M.D.   On: 09/26/2017 12:46     Scheduled Meds: . enoxaparin (LOVENOX) injection  40 mg Subcutaneous Q24H  . guaiFENesin  1,200 mg Oral BID  . insulin aspart  0-5 Units Subcutaneous QHS  . insulin aspart  0-9 Units Subcutaneous TID WC   Continuous Infusions: . sodium chloride 150 mL/hr at 09/28/17 1200  . azithromycin Stopped (09/28/17 0129)  . cefTRIAXone (ROCEPHIN)  IV Stopped (09/27/17 1354)     Pamella Pert, MD, PhD [facility] Pager [phone]  If 7PM-7AM, please contact night-coverage [email or web address] Password  [facility] 09/28/2017, 12:35 PM

## 2017-09-28 NOTE — Progress Notes (Signed)
  Echocardiogram 2D Echocardiogram has been performed.  Janice Wright, Janice Wright 09/28/2017, 3:03 PM

## 2017-09-28 NOTE — Progress Notes (Signed)
Inpatient Diabetes Program Recommendations  AACE/ADA: New Consensus Statement on Inpatient Glycemic Control (2015)  Target Ranges:  Prepandial:   less than 140 mg/dL      Peak postprandial:   less than 180 mg/dL (1-2 hours)      Critically ill patients:  140 - 180 mg/dL   Lab Results  Component Value Date   GLUCAP 225 (H) 09/28/2017    Review of Glycemic Control  Diabetes history: DM2 Outpatient Diabetes medications: glipizide 10 mg QAM, metformin 500 mg bid Current orders for Inpatient glycemic control: Novolog 0-9 units tidwc and hs  Inpatient Diabetes Program Recommendations:    Add Lantus 10 units QHS Add Novolog 3 units tidwc for meal coverage insulin Need updated HgbA1C.  Continue to follow.  Thank you. Ailene Ardshonda Isaac Lacson, RD, LDN, CDE Inpatient Diabetes Coordinator [phone]

## 2017-09-29 DIAGNOSIS — N3000 Acute cystitis without hematuria: Secondary | ICD-10-CM

## 2017-09-29 LAB — CULTURE, BLOOD (ROUTINE X 2): SPECIAL REQUESTS: ADEQUATE

## 2017-09-29 LAB — HEMOGLOBIN A1C
Hgb A1c MFr Bld: 8 % — ABNORMAL HIGH (ref 4.8–5.6)
Mean Plasma Glucose: 182.9 mg/dL

## 2017-09-29 LAB — BASIC METABOLIC PANEL
Anion gap: 6 (ref 5–15)
BUN: <5 mg/dL — ABNORMAL LOW (ref 6–20)
CHLORIDE: 106 mmol/L (ref 101–111)
CO2: 24 mmol/L (ref 22–32)
Calcium: 8.3 mg/dL — ABNORMAL LOW (ref 8.9–10.3)
Creatinine, Ser: 0.61 mg/dL (ref 0.44–1.00)
GFR calc Af Amer: >60 mL/min (ref >60)
GFR calc non Af Amer: >60 mL/min (ref >60)
Glucose, Bld: 200 mg/dL — ABNORMAL HIGH (ref 65–99)
POTASSIUM: 3.4 mmol/L — AB (ref 3.5–5.1)
SODIUM: 136 mmol/L (ref 135–145)

## 2017-09-29 LAB — CBC
HEMATOCRIT: 34.3 % — AB (ref 36.0–46.0)
HEMOGLOBIN: 11.4 g/dL — AB (ref 12.0–15.0)
MCH: 30.2 pg (ref 26.0–34.0)
MCHC: 33.2 g/dL (ref 30.0–36.0)
MCV: 90.7 fL (ref 78.0–100.0)
PLATELETS: 228 10*3/uL (ref 150–400)
RBC: 3.78 MIL/uL — AB (ref 3.87–5.11)
RDW: 13.4 % (ref 11.5–15.5)
WBC: 11.5 10*3/uL — AB (ref 4.0–10.5)

## 2017-09-29 LAB — BRAIN NATRIURETIC PEPTIDE: B Natriuretic Peptide: 114.7 pg/mL — ABNORMAL HIGH (ref 0.0–100.0)

## 2017-09-29 LAB — GLUCOSE, CAPILLARY: GLUCOSE-CAPILLARY: 147 mg/dL — AB (ref 65–99)

## 2017-09-29 LAB — T3, FREE: T3, Free: 1.7 pg/mL — ABNORMAL LOW (ref 2.0–4.4)

## 2017-09-29 LAB — PROCALCITONIN: PROCALCITONIN: 1.45 ng/mL

## 2017-09-29 MED ORDER — NICOTINE 14 MG/24HR TD PT24
14.0000 mg | MEDICATED_PATCH | Freq: Every day | TRANSDERMAL | Status: DC
  Filled 2017-09-29: qty 1

## 2017-09-29 MED ORDER — LEVOFLOXACIN 750 MG PO TABS: 750.0000 mg | ORAL_TABLET | Freq: Every day | ORAL | 0 refills | Status: DC

## 2017-09-29 MED ORDER — LEVOFLOXACIN 750 MG PO TABS: 750.0000 mg | ORAL_TABLET | Freq: Every day | ORAL | 0 refills | Status: AC

## 2017-09-29 MED ORDER — POTASSIUM CHLORIDE CRYS ER 20 MEQ PO TBCR
40.0000 meq | EXTENDED_RELEASE_TABLET | Freq: Every day | ORAL | Status: DC
  Administered 2017-09-29: 40 meq via ORAL
  Filled 2017-09-29: qty 2

## 2017-09-29 NOTE — Progress Notes (Signed)
Last set of vital signs taken at 1110: BP 91/59, HR 109, SpO2 93. Re-educated pt on purpose and need for continued treatment in the ICU; She verbalized desire to go home.  PIV x 1 removed. Prescription for Levaquin given to pt. The patient left AMA with her husband. Taken down in wheelchair by Sedalia Mutaiane, NT.

## 2017-09-29 NOTE — Progress Notes (Signed)
Inpatient Diabetes Program Recommendations  AACE/ADA: New Consensus Statement on Inpatient Glycemic Control (2015)  Target Ranges:  Prepandial:   less than 140 mg/dL      Peak postprandial:   less than 180 mg/dL (1-2 hours)      Critically ill patients:  140 - 180 mg/dL   Lab Results  Component Value Date   GLUCAP 279 (H) 09/28/2017   HGBA1C 8.0 (H) 09/29/2017    Review of Glycemic Control  Pt leaving AMA. Is willing to go home on insulin. States blood sugars have run in 300s at home. Reviewed hypoglycemia s/s and treatment. Pt states she has meter and strips and will check blood sugars at least 4x/day.   Recommend Lantus 10 units QHS, along with metformin 500 mg bid and glipizide 10 mg QD.  Recommendations: Increase glipizide to 10 mg bid Increase metformin to 1000 mg bid Add Lantus Solostar 10 units QHS. F/U with PCP and take logbook to MD appt.  Thank you. Ailene Ardshonda Vegas Coffin, RD, LDN, CDE Inpatient Diabetes Coordinator [phone]

## 2017-09-29 NOTE — Progress Notes (Signed)
Pt desatting during the night to as low as 82% on Rm Air. O2 applied at 2 liters to maintain oxygen sats in the 93-95% range. Pt removing o2 intermittently and reeducated frequently about current need for oxygen via nasal canula.

## 2017-09-29 NOTE — Progress Notes (Signed)
Pt requesting to be discharged. Educated pt on the need for continued treatment with IV abx, further evaluation, and continued monitoring. Pt continues to express desire to leave. MD made aware. Pt advised that if she chooses to leave, it would be against medical advice, and possible negative outcomes include but are not limited to ineffective treatment of infection, worsening of condition, and death.  MD saw patient at bedside and reiterated need for continued treatment, and possible negative outcomes if she leaves AMA. Pt continued to express desire to leave, and the AMA form was signed.

## 2017-09-29 NOTE — Discharge Summary (Addendum)
Physician Discharge Summary  Janice Wright ZOX:[id number] DOB: 1986-03-11 DOA: 09/26/2017  PCP: Rebecka Apley, NP  Admit date: 09/26/2017 Discharge date: 09/29/2017   Patient left AGAINST MEDICAL ADVICE.  Admitted From: home  Disposition: Left AGAINST MEDICAL ADVICE  Recommendations for Outpatient Follow-up:  1. Follow up with PCP in 1-2 weeks 2. Please obtain BMP/CBC in one week  Patient left AGAINST MEDICAL ADVICE therefore no formal discharge done. Brief/Interim Summary: 31 year old female with history of chronic lower back pain, cholecystectomy, type 2 diabetes, anxiety disorder presented to the ER with fever, dysuria not improving with Tylenol and supportive care at home.  In the ER patient was found to be hypotensive, tachycardic, elevated lactic acid level, leukocytosis which is consistent with a severe sepsis related with urinary tract infection.  Problems list Severe sepsis due to urinary tract infection/acute right-sided pyelonephritis with E. coli bacteremia: -Patient was treated with ceftriaxone.  Leukocytosis improving however patient is still with intermittent fever.  Patient also with coughing.  During the morning around I have discussed the patient and planned to get a chest x-ray, repeat blood culture, BMP, pro-calcitonin.  Later on I received a call from nurse that patient is willing to leave AGAINST MEDICAL ADVICE.  I again went to the patient's room and discussed with her and her fianc in detail.  Patient reported that she wants to go home as she is missing her children and she understands the risk of leaving AGAINST MEDICAL ADVICE.  She understands that the risk include but not limited to no improvement in her infection, sepsis to the point that she may die.  She is alert awake and oriented.  She is completely understanding severity of her illness and importance of undergoing further evaluation and treatment.  But she still wants to leave the hospital. I  explained to her that her decision is not that I recommend to her.  My recommendation for her to stay in the hospital, continue antibiotics and undergo further evaluation.  Since after understanding she clearly refused my recommendation, I explained to her the alternatives which is to take antibiotics for at least 10 days.  I therefore prescribed Levaquin for 10 days.  I educated patient to seek medical attention or contact her family doctor if she has fever headache, chest pain, shortness of breath or not feeling well.  She verbalized understanding.  Patient's nurse and patient's fianc at bedside.  Other medical problems including: Chronic lower back pain Type 2 diabetes Hypokalemia Tachycardia and fever. Anxiety depression  I recommended patient to continue her home medications and follow up with PCP in 1-2 days if possible.   Discharge Diagnoses:  Active Problems:   Type 2 diabetes mellitus with chronic kidney disease on chronic dialysis, without long-term current use of insulin (HCC)   Sepsis (HCC)   Sepsis secondary to UTI [facility])   Acute pyelonephritis   Hypotension    Discharge Instructions     No Known Allergies  Consultations: None  Procedures/Studies: None  Subjective: Seen and examined at bedside twice today.  Patient was having some cough but denies headache, dizziness, nausea vomiting.  Reported feeling better.   Patient is leaving AGAINST MEDICAL ADVICE.  Discharge Exam: Vitals:   09/29/17 0400 09/29/17 0800  BP:    Pulse:    Resp:    Temp: (!) 102.8 F (39.3 C) 100 F (37.8 C)  SpO2:     Vitals:   09/29/17 0350 09/29/17 0400 09/29/17 0500 09/29/17 0800  BP: 134/85  Pulse: (!) 138     Resp: (!) 26     Temp:  (!) 102.8 F (39.3 C)  100 F (37.8 C)  TempSrc:  Oral  Oral  SpO2: 92%     Weight:   108.1 kg (238 lb 5.1 oz)   Height:        General: Pt is alert, awake, not in acute distress Cardiovascular: Regular tachycardic, S1/S2 +, no rubs,  no gallops Respiratory: CTA bilaterally, no wheezing, no rhonchi Abdominal: Soft, NT, ND, bowel sounds + Extremities: no edema, no cyanosis    The results of significant diagnostics from this hospitalization (including imaging, microbiology, ancillary and laboratory) are listed below for reference.     Microbiology: Recent Results (from the past 240 hour(s))  Blood culture (routine x 2)     Status: Abnormal   Collection Time: 09/26/17 11:36 AM  Result Value Ref Range Status   Specimen Description RIGHT ANTECUBITAL DRAWN BY RN  Final   Special Requests   Final    BOTTLES DRAWN AEROBIC AND ANAEROBIC Blood Culture adequate volume   Culture  Setup Time   Final    GRAM NEGATIVE RODS IN BOTH AEROBIC AND ANAEROBIC BOTTLES Gram Stain Report Called to,Read Back By and Verified With: HAYMORE, R @ 0021 BY MATTHEWS, B 11.28.18 [facility] CRITICAL RESULT CALLED TO, READ BACK BY AND VERIFIED WITH: PHARMD A PHAM [id number] MLM Performed at [facility], [address], [place], [place] [address]    Culture ESCHERICHIA COLI (A)  Final   Report Status 09/29/2017 FINAL  Final   Organism ID, Bacteria ESCHERICHIA COLI  Final      Susceptibility   Escherichia coli - MIC*    AMPICILLIN >=32 RESISTANT Resistant     CEFAZOLIN <=4 SENSITIVE Sensitive     CEFEPIME <=1 SENSITIVE Sensitive     CEFTAZIDIME <=1 SENSITIVE Sensitive     CEFTRIAXONE <=1 SENSITIVE Sensitive     CIPROFLOXACIN <=0.25 SENSITIVE Sensitive     GENTAMICIN <=1 SENSITIVE Sensitive     IMIPENEM <=0.25 SENSITIVE Sensitive     TRIMETH/SULFA <=20 SENSITIVE Sensitive     AMPICILLIN/SULBACTAM 8 SENSITIVE Sensitive     PIP/TAZO <=4 SENSITIVE Sensitive     Extended ESBL NEGATIVE Sensitive     * ESCHERICHIA COLI  Blood Culture ID Panel (Reflexed)     Status: Abnormal   Collection Time: 09/26/17 11:36 AM  Result Value Ref Range Status   Enterococcus species NOT DETECTED NOT DETECTED Final   Listeria monocytogenes NOT  DETECTED NOT DETECTED Final   Staphylococcus species NOT DETECTED NOT DETECTED Final   Staphylococcus aureus NOT DETECTED NOT DETECTED Final   Streptococcus species NOT DETECTED NOT DETECTED Final   Streptococcus agalactiae NOT DETECTED NOT DETECTED Final   Streptococcus pneumoniae NOT DETECTED NOT DETECTED Final   Streptococcus pyogenes NOT DETECTED NOT DETECTED Final   Acinetobacter baumannii NOT DETECTED NOT DETECTED Final   Enterobacteriaceae species DETECTED (A) NOT DETECTED Final    Comment: Enterobacteriaceae represent a large family of gram-negative bacteria, not a single organism. CRITICAL RESULT CALLED TO, READ BACK BY AND VERIFIED WITH: PHARMD A PHAM [id number] MLM    Enterobacter cloacae complex NOT DETECTED NOT DETECTED Final   Escherichia coli DETECTED (A) NOT DETECTED Final    Comment: CRITICAL RESULT CALLED TO, READ BACK BY AND VERIFIED WITH: PHARMD A PHAM [id number] MLM    Klebsiella oxytoca NOT DETECTED NOT DETECTED Final   Klebsiella pneumoniae NOT DETECTED  NOT DETECTED Final   Proteus species NOT DETECTED NOT DETECTED Final   Serratia marcescens NOT DETECTED NOT DETECTED Final   Carbapenem resistance NOT DETECTED NOT DETECTED Final   Haemophilus influenzae NOT DETECTED NOT DETECTED Final   Neisseria meningitidis NOT DETECTED NOT DETECTED Final   Pseudomonas aeruginosa NOT DETECTED NOT DETECTED Final   Candida albicans NOT DETECTED NOT DETECTED Final   Candida glabrata NOT DETECTED NOT DETECTED Final   Candida krusei NOT DETECTED NOT DETECTED Final   Candida parapsilosis NOT DETECTED NOT DETECTED Final   Candida tropicalis NOT DETECTED NOT DETECTED Final    Comment: Performed at [facility], [address] [place], [place] [address]  Blood culture (routine x 2)     Status: Abnormal   Collection Time: 09/26/17 12:15 PM  Result Value Ref Range Status   Specimen Description BLOOD RIGHT HAND  Final   Special Requests   Final    BOTTLES DRAWN AEROBIC AND  ANAEROBIC Blood Culture results may not be optimal due to an inadequate volume of blood received in culture bottles   Culture  Setup Time   Final    GRAM NEGATIVE RODS IN BOTH AEROBIC AND ANAEROBIC BOTTLES Gram Stain Report Called to,Read Back By and Verified With: MCFARLAND, M (Felicity) @ 0245 BY MATTHEWS B 11.28.18 [facility]    Culture (A)  Final    ESCHERICHIA COLI SUSCEPTIBILITIES PERFORMED ON PREVIOUS CULTURE WITHIN THE LAST 5 DAYS. Performed at [facility], [address] [place], [place] [address]    Report Status 09/29/2017 FINAL  Final  Urine culture     Status: Abnormal   Collection Time: 09/26/17 12:26 PM  Result Value Ref Range Status   Specimen Description URINE, RANDOM  Final   Special Requests NONE  Final   Culture >=100,000 COLONIES/mL ESCHERICHIA COLI (A)  Final   Report Status 09/28/2017 FINAL  Final   Organism ID, Bacteria ESCHERICHIA COLI (A)  Final      Susceptibility   Escherichia coli - MIC*    AMPICILLIN >=32 RESISTANT Resistant     CEFAZOLIN <=4 SENSITIVE Sensitive     CEFTRIAXONE <=1 SENSITIVE Sensitive     CIPROFLOXACIN <=0.25 SENSITIVE Sensitive     GENTAMICIN <=1 SENSITIVE Sensitive     IMIPENEM <=0.25 SENSITIVE Sensitive     NITROFURANTOIN <=16 SENSITIVE Sensitive     TRIMETH/SULFA <=20 SENSITIVE Sensitive     AMPICILLIN/SULBACTAM 8 SENSITIVE Sensitive     PIP/TAZO <=4 SENSITIVE Sensitive     Extended ESBL NEGATIVE Sensitive     * >=100,000 COLONIES/mL ESCHERICHIA COLI  MRSA PCR Screening     Status: None   Collection Time: 09/27/17  7:38 AM  Result Value Ref Range Status   MRSA by PCR NEGATIVE NEGATIVE Final    Comment:        The GeneXpert MRSA Assay (FDA approved for NASAL specimens only), is one component of a comprehensive MRSA colonization surveillance program. It is not intended to diagnose MRSA infection nor to guide or monitor treatment for MRSA infections.      Labs: BNP (last 3 results) No results for  input(s): BNP in the last 8760 hours. Basic Metabolic Panel: Recent Labs  Lab 09/26/17 1136 09/27/17 0003 09/27/17 0610 09/28/17 0346 09/29/17 0404  NA 131* 134* 137 137 136  K 3.7 4.1 3.6 3.9 3.4*  CL 99* 106 113* 111 106  CO2 20* 18* 21* 20* 24  GLUCOSE 393* 279* 227* 184* 200*  BUN 10 9 8 6  <5*  CREATININE 0.80 0.77 0.65 0.58 0.61  CALCIUM 9.4 7.8* 7.3* 7.9* 8.3*  MG  --  1.4*  --   --   --    Liver Function Tests: Recent Labs  Lab 09/26/17 1136 09/27/17 0003 09/27/17 0610  AST 24 25 21   ALT 21 19 18   ALKPHOS 89 80 68  BILITOT 0.3 0.1* 0.4  PROT 7.6 6.4* 5.2*  ALBUMIN 3.4* 2.8* 2.1*   No results for input(s): LIPASE, AMYLASE in the last 168 hours. No results for input(s): AMMONIA in the last 168 hours. CBC: Recent Labs  Lab 09/26/17 1136 09/27/17 0825 09/28/17 0346 09/29/17 0404  WBC 26.0* 14.9* 14.6* 11.5*  NEUTROABS 23.7*  --   --   --   HGB 14.4 12.2 13.7 11.4*  HCT 44.3 37.4 40.3 34.3*  MCV 92.9 92.1 92.4 90.7  PLT 247 183 158 228   Cardiac Enzymes: No results for input(s): CKTOTAL, CKMB, CKMBINDEX, TROPONINI in the last 168 hours. BNP: Invalid input(s): POCBNP CBG: Recent Labs  Lab 09/27/17 2125 09/28/17 0808 09/28/17 1133 09/28/17 1657 09/28/17 2143  GLUCAP 218* 225* 225* 181* 279*   D-Dimer No results for input(s): DDIMER in the last 72 hours. Hgb A1c Recent Labs    09/29/17 0404  HGBA1C 8.0*   Lipid Profile No results for input(s): CHOL, HDL, LDLCALC, TRIG, CHOLHDL, LDLDIRECT in the last 72 hours. Thyroid function studies Recent Labs    09/28/17 0918  TSH 0.507  T3FREE 1.7*   Anemia work up No results for input(s): VITAMINB12, FOLATE, FERRITIN, TIBC, IRON, RETICCTPCT in the last 72 hours. Urinalysis    Component Value Date/Time   COLORURINE YELLOW 09/26/2017 1247   APPEARANCEUR CLOUDY (A) 09/26/2017 1247   LABSPEC 1.023 09/26/2017 1247   PHURINE 6.0 09/26/2017 1247   GLUCOSEU >=500 (A) 09/26/2017 1247   HGBUR SMALL  (A) 09/26/2017 1247   BILIRUBINUR NEGATIVE 09/26/2017 1247   KETONESUR NEGATIVE 09/26/2017 1247   PROTEINUR 30 (A) 09/26/2017 1247   UROBILINOGEN 0.2 03/23/2015 0930   NITRITE NEGATIVE 09/26/2017 1247   LEUKOCYTESUR MODERATE (A) 09/26/2017 1247   Sepsis Labs Invalid input(s): PROCALCITONIN,  WBC,  LACTICIDVEN Microbiology Recent Results (from the past 240 hour(s))  Blood culture (routine x 2)     Status: Abnormal   Collection Time: 09/26/17 11:36 AM  Result Value Ref Range Status   Specimen Description RIGHT ANTECUBITAL DRAWN BY RN  Final   Special Requests   Final    BOTTLES DRAWN AEROBIC AND ANAEROBIC Blood Culture adequate volume   Culture  Setup Time   Final    GRAM NEGATIVE RODS IN BOTH AEROBIC AND ANAEROBIC BOTTLES Gram Stain Report Called to,Read Back By and Verified With: HAYMORE, R @ 0021 BY MATTHEWS, B 11.28.18 [facility] CRITICAL RESULT CALLED TO, READ BACK BY AND VERIFIED WITH: PHARMD A PHAM [id number] MLM Performed at [facility], [address], [place], [place] [address]    Culture ESCHERICHIA COLI (A)  Final   Report Status 09/29/2017 FINAL  Final   Organism ID, Bacteria ESCHERICHIA COLI  Final      Susceptibility   Escherichia coli - MIC*    AMPICILLIN >=32 RESISTANT Resistant     CEFAZOLIN <=4 SENSITIVE Sensitive     CEFEPIME <=1 SENSITIVE Sensitive     CEFTAZIDIME <=1 SENSITIVE Sensitive     CEFTRIAXONE <=1 SENSITIVE Sensitive     CIPROFLOXACIN <=0.25 SENSITIVE Sensitive  GENTAMICIN <=1 SENSITIVE Sensitive     IMIPENEM <=0.25 SENSITIVE Sensitive     TRIMETH/SULFA <=20 SENSITIVE Sensitive     AMPICILLIN/SULBACTAM 8 SENSITIVE Sensitive     PIP/TAZO <=4 SENSITIVE Sensitive     Extended ESBL NEGATIVE Sensitive     * ESCHERICHIA COLI  Blood Culture ID Panel (Reflexed)     Status: Abnormal   Collection Time: 09/26/17 11:36 AM  Result Value Ref Range Status   Enterococcus species NOT DETECTED NOT DETECTED Final   Listeria monocytogenes NOT  DETECTED NOT DETECTED Final   Staphylococcus species NOT DETECTED NOT DETECTED Final   Staphylococcus aureus NOT DETECTED NOT DETECTED Final   Streptococcus species NOT DETECTED NOT DETECTED Final   Streptococcus agalactiae NOT DETECTED NOT DETECTED Final   Streptococcus pneumoniae NOT DETECTED NOT DETECTED Final   Streptococcus pyogenes NOT DETECTED NOT DETECTED Final   Acinetobacter baumannii NOT DETECTED NOT DETECTED Final   Enterobacteriaceae species DETECTED (A) NOT DETECTED Final    Comment: Enterobacteriaceae represent a large family of gram-negative bacteria, not a single organism. CRITICAL RESULT CALLED TO, READ BACK BY AND VERIFIED WITH: PHARMD A PHAM [id number] MLM    Enterobacter cloacae complex NOT DETECTED NOT DETECTED Final   Escherichia coli DETECTED (A) NOT DETECTED Final    Comment: CRITICAL RESULT CALLED TO, READ BACK BY AND VERIFIED WITH: PHARMD A PHAM [id number] MLM    Klebsiella oxytoca NOT DETECTED NOT DETECTED Final   Klebsiella pneumoniae NOT DETECTED NOT DETECTED Final   Proteus species NOT DETECTED NOT DETECTED Final   Serratia marcescens NOT DETECTED NOT DETECTED Final   Carbapenem resistance NOT DETECTED NOT DETECTED Final   Haemophilus influenzae NOT DETECTED NOT DETECTED Final   Neisseria meningitidis NOT DETECTED NOT DETECTED Final   Pseudomonas aeruginosa NOT DETECTED NOT DETECTED Final   Candida albicans NOT DETECTED NOT DETECTED Final   Candida glabrata NOT DETECTED NOT DETECTED Final   Candida krusei NOT DETECTED NOT DETECTED Final   Candida parapsilosis NOT DETECTED NOT DETECTED Final   Candida tropicalis NOT DETECTED NOT DETECTED Final    Comment: Performed at [facility], [address] [place], [place] [address]  Blood culture (routine x 2)     Status: Abnormal   Collection Time: 09/26/17 12:15 PM  Result Value Ref Range Status   Specimen Description BLOOD RIGHT HAND  Final   Special Requests   Final    BOTTLES DRAWN AEROBIC AND  ANAEROBIC Blood Culture results may not be optimal due to an inadequate volume of blood received in culture bottles   Culture  Setup Time   Final    GRAM NEGATIVE RODS IN BOTH AEROBIC AND ANAEROBIC BOTTLES Gram Stain Report Called to,Read Back By and Verified With: MCFARLAND, M ([place]) @ 0245 BY MATTHEWS B 11.28.18 [facility]    Culture (A)  Final    ESCHERICHIA COLI SUSCEPTIBILITIES PERFORMED ON PREVIOUS CULTURE WITHIN THE LAST 5 DAYS. Performed at [facility], [address], [place], [place] [address]    Report Status 09/29/2017 FINAL  Final  Urine culture     Status: Abnormal   Collection Time: 09/26/17 12:26 PM  Result Value Ref Range Status   Specimen Description URINE, RANDOM  Final   Special Requests NONE  Final   Culture >=100,000 COLONIES/mL ESCHERICHIA COLI (A)  Final   Report Status 09/28/2017 FINAL  Final   Organism ID, Bacteria ESCHERICHIA COLI (A)  Final      Susceptibility  Escherichia coli - MIC*    AMPICILLIN >=32 RESISTANT Resistant     CEFAZOLIN <=4 SENSITIVE Sensitive     CEFTRIAXONE <=1 SENSITIVE Sensitive     CIPROFLOXACIN <=0.25 SENSITIVE Sensitive     GENTAMICIN <=1 SENSITIVE Sensitive     IMIPENEM <=0.25 SENSITIVE Sensitive     NITROFURANTOIN <=16 SENSITIVE Sensitive     TRIMETH/SULFA <=20 SENSITIVE Sensitive     AMPICILLIN/SULBACTAM 8 SENSITIVE Sensitive     PIP/TAZO <=4 SENSITIVE Sensitive     Extended ESBL NEGATIVE Sensitive     * >=100,000 COLONIES/mL ESCHERICHIA COLI  MRSA PCR Screening     Status: None   Collection Time: 09/27/17  7:38 AM  Result Value Ref Range Status   MRSA by PCR NEGATIVE NEGATIVE Final    Comment:        The GeneXpert MRSA Assay (FDA approved for NASAL specimens only), is one component of a comprehensive MRSA colonization surveillance program. It is not intended to diagnose MRSA infection nor to guide or monitor treatment for MRSA infections.      Time coordinating discharge: 36  minutes  SIGNED:   Maxie Barbron Prasad Arnitra Sokoloski, MD  [facility] 09/29/2017, 10:01 AM  If 7PM-7AM, please contact night-coverage [email or web address] Password [email or web address]

## 2017-10-04 LAB — CULTURE, BLOOD (ROUTINE X 2)
CULTURE: NO GROWTH
CULTURE: NO GROWTH
Special Requests: ADEQUATE
Special Requests: ADEQUATE

## 2017-12-17 ENCOUNTER — Other Ambulatory Visit: Payer: Self-pay

## 2017-12-17 ENCOUNTER — Emergency Department (HOSPITAL_COMMUNITY): Payer: Medicaid Other

## 2017-12-17 ENCOUNTER — Encounter (HOSPITAL_COMMUNITY): Payer: Self-pay | Admitting: Emergency Medicine

## 2017-12-17 ENCOUNTER — Emergency Department (HOSPITAL_COMMUNITY)
Admission: EM | Admit: 2017-12-17 | Discharge: 2017-12-17 | Disposition: A | Discharge status: Home / Self Care | Payer: Medicaid Other | Attending: Emergency Medicine | Admitting: Emergency Medicine

## 2017-12-17 DIAGNOSIS — E119 Type 2 diabetes mellitus without complications: Secondary | ICD-10-CM | POA: Insufficient documentation

## 2017-12-17 DIAGNOSIS — E785 Hyperlipidemia, unspecified: Secondary | ICD-10-CM | POA: Insufficient documentation

## 2017-12-17 DIAGNOSIS — R509 Fever, unspecified: Secondary | ICD-10-CM | POA: Diagnosis present

## 2017-12-17 DIAGNOSIS — N390 Urinary tract infection, site not specified: Secondary | ICD-10-CM | POA: Insufficient documentation

## 2017-12-17 DIAGNOSIS — Z794 Long term (current) use of insulin: Secondary | ICD-10-CM | POA: Diagnosis not present

## 2017-12-17 LAB — COMPREHENSIVE METABOLIC PANEL
ALT: 21 U/L (ref 14–54)
AST: 23 U/L (ref 15–41)
Albumin: 3.8 g/dL (ref 3.5–5.0)
Alkaline Phosphatase: 81 U/L (ref 38–126)
Anion gap: 12 (ref 5–15)
BUN: 5 mg/dL — ABNORMAL LOW (ref 6–20)
CO2: 18 mmol/L — ABNORMAL LOW (ref 22–32)
Calcium: 9.4 mg/dL (ref 8.9–10.3)
Chloride: 102 mmol/L (ref 101–111)
Creatinine, Ser: 0.6 mg/dL (ref 0.44–1.00)
GFR calc Af Amer: >60 mL/min (ref >60)
GFR calc non Af Amer: >60 mL/min (ref >60)
Glucose, Bld: 188 mg/dL — ABNORMAL HIGH (ref 65–99)
Potassium: 3.7 mmol/L (ref 3.5–5.1)
Sodium: 132 mmol/L — ABNORMAL LOW (ref 135–145)
Total Bilirubin: 0.6 mg/dL (ref 0.3–1.2)
Total Protein: 8.2 g/dL — ABNORMAL HIGH (ref 6.5–8.1)

## 2017-12-17 LAB — URINALYSIS, ROUTINE W REFLEX MICROSCOPIC
Bilirubin Urine: NEGATIVE
Glucose, UA: NEGATIVE mg/dL
Hgb urine dipstick: NEGATIVE
Ketones, ur: NEGATIVE mg/dL
Nitrite: POSITIVE — AB
Protein, ur: 100 mg/dL — AB
Specific Gravity, Urine: 1.019 (ref 1.005–1.030)
pH: 6 (ref 5.0–8.0)

## 2017-12-17 LAB — CBC WITH DIFFERENTIAL/PLATELET
Basophils Absolute: 0 10*3/uL (ref 0.0–0.1)
Basophils Relative: 0 %
Eosinophils Absolute: 0 10*3/uL (ref 0.0–0.7)
Eosinophils Relative: 0 %
HCT: 43.7 % (ref 36.0–46.0)
Hemoglobin: 14.8 g/dL (ref 12.0–15.0)
Lymphocytes Relative: 6 %
Lymphs Abs: 1.1 10*3/uL (ref 0.7–4.0)
MCH: 30.3 pg (ref 26.0–34.0)
MCHC: 33.9 g/dL (ref 30.0–36.0)
MCV: 89.4 fL (ref 78.0–100.0)
Monocytes Absolute: 1 10*3/uL (ref 0.1–1.0)
Monocytes Relative: 5 %
Neutro Abs: 15.9 10*3/uL — ABNORMAL HIGH (ref 1.7–7.7)
Neutrophils Relative %: 89 %
Platelets: 339 10*3/uL (ref 150–400)
RBC: 4.89 MIL/uL (ref 3.87–5.11)
RDW: 12.3 % (ref 11.5–15.5)
WBC: 17.9 10*3/uL — ABNORMAL HIGH (ref 4.0–10.5)

## 2017-12-17 LAB — LACTIC ACID, PLASMA
Lactic Acid, Venous: 2.2 mmol/L (ref 0.5–1.9)
Lactic Acid, Venous: 2 mmol/L (ref 0.5–1.9)

## 2017-12-17 LAB — INFLUENZA PANEL BY PCR (TYPE A & B)
Influenza A By PCR: NEGATIVE
Influenza B By PCR: NEGATIVE

## 2017-12-17 LAB — CBG MONITORING, ED: Glucose-Capillary: 185 mg/dL — ABNORMAL HIGH (ref 65–99)

## 2017-12-17 LAB — PREGNANCY, URINE: Preg Test, Ur: POSITIVE — AB

## 2017-12-17 MED ORDER — CEPHALEXIN 500 MG PO CAPS: 500.0000 mg | ORAL_CAPSULE | Freq: Three times a day (TID) | ORAL | 0 refills | Status: DC

## 2017-12-17 MED ORDER — SODIUM CHLORIDE 0.9 % IV SOLN
2.0000 g | Freq: Once | INTRAVENOUS | Status: AC
  Administered 2017-12-17: 2 g via INTRAVENOUS
  Filled 2017-12-17: qty 20

## 2017-12-17 MED ORDER — LACTATED RINGERS IV BOLUS (SEPSIS)
1000.0000 mL | Freq: Once | INTRAVENOUS | Status: AC
  Administered 2017-12-17: 1000 mL via INTRAVENOUS

## 2017-12-17 NOTE — ED Notes (Signed)
CRITICAL VALUE ALERT  Critical Value:  Lactic Acid 2.0  Date & Time Notied:  12/17/17 @2046  Provider Notified:[place] Orders Received/Actions taken: None yet

## 2017-12-17 NOTE — ED Notes (Signed)
This RN went in to this pt's room to discharge her and the pt was not in the room and the room was clean. The sitter that was sitting in the next room over states that the pt left. This RN called the pt cell # and advised her about the prescription for antibiotics.

## 2017-12-17 NOTE — ED Notes (Addendum)
CRITICAL VALUE ALERT  Critical Value:  Lactic Acid 2.2 Date & Time Notied:  12/17/17 @ 1935 Provider Notified:[place] Orders Received/Actions taken: None yet

## 2017-12-17 NOTE — ED Triage Notes (Signed)
Pt is [redacted] weeks pregnant. Pt c/o fever, generalized bodyaches, vomiting, and abdominal cramping that began this morning. Denies vaginal bleeding/spotting. LMP November 20th. Pt sees Dr. Janee [facility]ornhompson [facility]

## 2017-12-19 NOTE — ED Provider Notes (Signed)
[facility]ENCY [facility]PARTMENT Provider Note   CSN: [id number] Arrival date & time: 12/17/17  1810     History   Chief Complaint Chief Complaint  Patient presents with  . Fever    HPI Janice Wright is a 32 y.o. female.  HPI   32 year old female with abdominal pain.  Fever.  Onset this morning.  Associated with generalized body aches, vomiting and some lower abdominal pain.  She reports that she is [redacted] weeks pregnant.  She denies any vaginal bleeding or discharge.  Reports no issues with this pregnancy since she has been urinating more frequently.  She has no dysuria.  Past Medical History:  Diagnosis Date  . Anxiety   . Chronic lower back pain   . Constipation due to opioid therapy   . Depression 2014   hx; "resolved"  . Fatty liver   . Hyperlipidemia   . Polycystic ovarian syndrome   . PONV (postoperative nausea and vomiting)   . Type II diabetes mellitus (HCC) dx'd 2014    Patient Active Problem List   Diagnosis Date Noted  . Acute cystitis without hematuria   . Acute pyelonephritis   . Hypotension   . Sepsis (HCC) 09/26/2017  . Sepsis secondary to UTI (HCC) 09/26/2017  . Dehydration 01/20/2017  . Bronchitis, acute 01/20/2017  . Gastroenteritis 01/20/2017  . Tachycardia 01/20/2017  . Type 2 diabetes mellitus with chronic kidney disease on chronic dialysis, without long-term current use of insulin (HCC) 01/20/2017  . Chronic pain syndrome 01/20/2017  . Radiculopathy 04/01/2015    Past Surgical History:  Procedure Laterality Date  . BACK SURGERY     x 2  . CESAREAN SECTION  2011  . KNEE ARTHROSCOPY Left 2007  . LAPAROSCOPIC CHOLECYSTECTOMY  2006  . LUMBAR DISC SURGERY  08/2008; 12/2010  . POSTERIOR LUMBAR FUSION  10/2012  . POSTERIOR LUMBAR FUSION  04/01/2015   revision/notes 04/01/2015  . WISDOM TOOTH EXTRACTION     "2 @ a time"    OB History    Gravida Para Term Preterm AB Living   1             SAB TAB Ectopic Multiple Live Births                     Home Medications    Prior to Admission medications   Medication Sig Start Date End Date Taking? Authorizing Provider  DICLEGIS 10-10 MG TBEC 2 AT BED-CAN INCREASE TO 1 IN AM AND 2 AT BED IF SYMPTONS CONTINUE-MAY GO TO 1 AM,1 AFTERNOON,2 BED 11/20/17  Yes [provider]  HUMULIN N 100 UNIT/ML injection INJECT 20 UNITS BEFORE BREAKFAST AND 10 UNITS BEFORE DINNER 11/27/17  Yes [provider]  metFORMIN (GLUCOPHAGE) 500 MG tablet Take 500 mg by mouth 2 (two) times daily with a meal.    Yes [provider]  NOVOLIN R 100 UNIT/ML injection INJECT 10 UNITS BEFORE BREAKFAST AND DINNER 11/27/17  Yes [provider]  Prenatal Vit-Fe Fumarate-FA (PRENATAL MULTIVITAMIN) TABS tablet Take 1 tablet by mouth daily at 12 noon.   Yes [provider]  cephALEXin (KEFLEX) 500 MG capsule Take 1 capsule (500 mg total) by mouth 3 (three) times daily. 12/17/17   Raeford RazorKohut, Emmaly Leech, MD    Family History No family history on file.  Social History Social History   Tobacco Use  . Smoking status: Never Smoker  . Smokeless tobacco: Never Used  Substance Use Topics  . Alcohol use:  No  . Drug use: No     Allergies   Patient has no known allergies.   Review of Systems Review of Systems  All systems reviewed and negative, other than as noted in HPI.  Physical Exam Updated Vital Signs BP 121/74   Pulse (!) 130   Temp 98.6 F (37 C) (Oral)   Resp 16   Ht 5\' 4"  (1.626 m)   Wt 103.4 kg (228 lb)   LMP 09/25/2017 (Approximate)   SpO2 100%   BMI 39.14 kg/m   Physical Exam  Constitutional: She appears well-developed and well-nourished. No distress.  HENT:  Head: Normocephalic and atraumatic.  Eyes: Conjunctivae are normal. Right eye exhibits no discharge. Left eye exhibits no discharge.  Neck: Neck supple.  Cardiovascular: Regular rhythm and normal heart sounds. Exam reveals no gallop and no friction rub.  No murmur heard. Tachycardic  Pulmonary/Chest:  Effort normal and breath sounds normal. No respiratory distress.  Abdominal: Soft. She exhibits no distension. There is tenderness.  Mild lower abdominal tenderness without laterality.  No rebound or guarding.  Musculoskeletal: She exhibits no edema or tenderness.  Neurological: She is alert.  Skin: Skin is warm and dry.  Psychiatric: She has a normal mood and affect. Her behavior is normal. Thought content normal.  Nursing note and vitals reviewed.    ED Treatments / Results  Labs (all labs ordered are listed, but only abnormal results are displayed) Labs Reviewed  URINE CULTURE - Abnormal; Notable for the following components:      Result Value   Culture >=100,000 COLONIES/mL ESCHERICHIA COLI (*)    All other components within normal limits  COMPREHENSIVE METABOLIC PANEL - Abnormal; Notable for the following components:   Sodium 132 (*)    CO2 18 (*)    Glucose, Bld 188 (*)    BUN 5 (*)    Total Protein 8.2 (*)    All other components within normal limits  CBC WITH DIFFERENTIAL/PLATELET - Abnormal; Notable for the following components:   WBC 17.9 (*)    Neutro Abs 15.9 (*)    All other components within normal limits  URINALYSIS, ROUTINE W REFLEX MICROSCOPIC - Abnormal; Notable for the following components:   APPearance CLOUDY (*)    Protein, ur 100 (*)    Nitrite POSITIVE (*)    Leukocytes, UA MODERATE (*)    Bacteria, UA MANY (*)    Squamous Epithelial / LPF 6-30 (*)    All other components within normal limits  LACTIC ACID, PLASMA - Abnormal; Notable for the following components:   Lactic Acid, Venous 2.2 (*)    All other components within normal limits  LACTIC ACID, PLASMA - Abnormal; Notable for the following components:   Lactic Acid, Venous 2.0 (*)    All other components within normal limits  PREGNANCY, URINE - Abnormal; Notable for the following components:   Preg Test, Ur POSITIVE (*)    All other components within normal limits  CBG MONITORING, ED -  Abnormal; Notable for the following components:   Glucose-Capillary 185 (*)    All other components within normal limits  INFLUENZA PANEL BY PCR (TYPE A & B)    EKG  EKG Interpretation None       Radiology No results found.  Procedures Procedures (including critical care time)  Medications Ordered in ED Medications  lactated ringers bolus 1,000 mL (0 mLs Intravenous Stopped 12/17/17 2024)  cefTRIAXone (ROCEPHIN) 2 g in sodium chloride 0.9 % 100 mL IVPB (  0 g Intravenous Stopped 12/17/17 2104)     Initial Impression / Assessment and Plan / ED Course  I have reviewed the triage vital signs and the nursing notes.  Pertinent labs & imaging results that were available during my care of the patient were reviewed by me and considered in my medical decision making (see chart for details).    31yF with abdominal pain. Has UTI. 11w pregnant. Given IV rocephin. She would like to go home. I would like to see her HR lower but she reports she typically is tachycardic. She is tolerating PO. Nontoxic. Strict return precautions were discussed.   Final Clinical Impressions(s) / ED Diagnoses   Final diagnoses:  Urinary tract infection without hematuria, site unspecified    ED Discharge Orders        Ordered    cephALEXin (KEFLEX) 500 MG capsule  3 times daily     12/17/17 2215       Raeford Razor, MD 12/19/17 2014

## 2017-12-20 LAB — URINE CULTURE: Culture: >=100000 — AB

## 2017-12-21 ENCOUNTER — Telehealth: Payer: Self-pay | Admitting: Emergency Medicine

## 2017-12-21 NOTE — Telephone Encounter (Signed)
Post ED Visit - Positive Culture Follow-up  Culture report reviewed by antimicrobial stewardship pharmacist:  []  Enzo BiNathan Batchelder, Pharm.D. []  Celedonio MiyamotoJeremy Frens, [address]Ph[address]D., BCPS AQ-ID []  Garvin FilaMike Maccia, Pharm.D., BCPS [x]  Georgina PillionElizabeth Martin, Pharm.D., BCPS []  [place] BendMinh Pham, [address]Pharm.D., BCPS, AAHIVP []  Estella HuskMichelle Turner, Pharm.D., BCPS, AAHIVP []  Lysle Pearlachel Rumbarger, PharmD, BCPS []  Blake DivineShannon Parkey, PharmD []  Pollyann SamplesAndy Johnston, PharmD, BCPS  Positive urine culture Treated with cephalexin, organism sensitive to the same and no further patient follow-up is required at this time.  Berle MullMiller, Danae Oland 12/21/2017, 10:02 AM

## 2018-08-12 ENCOUNTER — Emergency Department (HOSPITAL_COMMUNITY)
Admission: EM | Admit: 2018-08-12 | Discharge: 2018-08-12 | Disposition: A | Discharge status: Home / Self Care | Payer: Medicare Other | Attending: Emergency Medicine | Admitting: Emergency Medicine

## 2018-08-12 ENCOUNTER — Encounter (HOSPITAL_COMMUNITY): Payer: Self-pay | Admitting: Emergency Medicine

## 2018-08-12 ENCOUNTER — Other Ambulatory Visit: Payer: Self-pay

## 2018-08-12 DIAGNOSIS — F17210 Nicotine dependence, cigarettes, uncomplicated: Secondary | ICD-10-CM | POA: Insufficient documentation

## 2018-08-12 DIAGNOSIS — G8929 Other chronic pain: Secondary | ICD-10-CM | POA: Diagnosis not present

## 2018-08-12 DIAGNOSIS — R200 Anesthesia of skin: Secondary | ICD-10-CM | POA: Diagnosis not present

## 2018-08-12 DIAGNOSIS — M62830 Muscle spasm of back: Secondary | ICD-10-CM | POA: Diagnosis not present

## 2018-08-12 DIAGNOSIS — R202 Paresthesia of skin: Secondary | ICD-10-CM | POA: Diagnosis not present

## 2018-08-12 DIAGNOSIS — M5431 Sciatica, right side: Secondary | ICD-10-CM | POA: Insufficient documentation

## 2018-08-12 DIAGNOSIS — M549 Dorsalgia, unspecified: Secondary | ICD-10-CM | POA: Diagnosis present

## 2018-08-12 MED ORDER — DEXAMETHASONE SODIUM PHOSPHATE 10 MG/ML IJ SOLN
10.0000 mg | Freq: Once | INTRAMUSCULAR | Status: AC
  Administered 2018-08-12: 10 mg via INTRAMUSCULAR
  Filled 2018-08-12: qty 1

## 2018-08-12 MED ORDER — KETOROLAC TROMETHAMINE 10 MG PO TABS
10.0000 mg | ORAL_TABLET | Freq: Once | ORAL | Status: AC
  Administered 2018-08-12: 10 mg via ORAL
  Filled 2018-08-12: qty 1

## 2018-08-12 MED ORDER — ONDANSETRON HCL 4 MG PO TABS
4.0000 mg | ORAL_TABLET | Freq: Once | ORAL | Status: AC
  Administered 2018-08-12: 4 mg via ORAL
  Filled 2018-08-12: qty 1

## 2018-08-12 MED ORDER — TRAMADOL HCL 50 MG PO TABS: ORAL_TABLET | ORAL | 0 refills | Status: DC

## 2018-08-12 MED ORDER — DICLOFENAC SODIUM 75 MG PO TBEC: 75.0000 mg | DELAYED_RELEASE_TABLET | Freq: Two times a day (BID) | ORAL | 0 refills | Status: DC

## 2018-08-12 MED ORDER — DEXAMETHASONE 4 MG PO TABS: 4.0000 mg | ORAL_TABLET | Freq: Two times a day (BID) | ORAL | 0 refills | Status: DC

## 2018-08-12 MED ORDER — DIAZEPAM 5 MG PO TABS
10.0000 mg | ORAL_TABLET | Freq: Once | ORAL | Status: AC
  Administered 2018-08-12: 10 mg via ORAL
  Filled 2018-08-12: qty 2

## 2018-08-12 MED ORDER — HYDROCODONE-ACETAMINOPHEN 5-325 MG PO TABS
2.0000 | ORAL_TABLET | Freq: Once | ORAL | Status: AC
  Administered 2018-08-12: 2 via ORAL
  Filled 2018-08-12: qty 2

## 2018-08-12 NOTE — Discharge Instructions (Signed)
Please use a heating pad to your lower back when you are at rest.  Please call your surgeon and set up an appointment for recheck on tomorrow 10/14.Marland Kitchen  Use Decadron and diclofenac 2 times daily with a meal.  Continue to use your Flexeril as prescribed.  Use Ultram for more severe pain.  Ultram and Flexeril may cause drowsiness.  Please do not drive a vehicle, operate machines, drink alcohol, or participate in activity that requires concentration.

## 2018-08-12 NOTE — ED Provider Notes (Signed)
[facility] EMERGENCY DEPARTMENT Provider Note   CSN: [id number] Arrival date & time: 08/12/18  1511     History   Chief Complaint Chief Complaint  Patient presents with  . Back Pain    HPI Janice Wright is a 32 y.o. female.  Hx of 4 back surgeries. [name], MD last surgeon. No complaint of foot drop or loss of bowel or bladder function.  The history is provided by the patient.  Back Pain   This is a chronic problem. The current episode started yesterday (pain for the past several weeks, but worse yesterday). The problem occurs constantly. The problem has been gradually worsening. The pain is associated with lifting heavy objects. The pain is present in the lumbar spine. The quality of the pain is described as stabbing and aching. The pain radiates to the right thigh. The pain is moderate. The symptoms are aggravated by certain positions. The pain is the same all the time. Associated symptoms include paresthesias and tingling. Pertinent negatives include no chest pain, no fever, no abdominal pain, no bowel incontinence, no perianal numbness, no bladder incontinence and no dysuria. Treatments tried: flexeril. The treatment provided no relief. Risk factors: 4 previous surgeries.    History reviewed. No pertinent past medical history.  There are no active problems to display for this patient.   Past Surgical History:  Procedure Laterality Date  . BACK SURGERY    . CESAREAN SECTION    . CHOLECYSTECTOMY       OB History   None      Home Medications    Prior to Admission medications   Not on File    Family History No family history on file.  Social History Social History   Tobacco Use  . Smoking status: Current Every Day Smoker    Packs/day: 0.50  . Smokeless tobacco: Never Used  Substance Use Topics  . Alcohol use: Not Currently  . Drug use: Not on file     Allergies   Patient has no known allergies.   Review of Systems Review of Systems    Constitutional: Negative for activity change and fever.       All ROS Neg except as noted in HPI  HENT: Negative for nosebleeds.   Eyes: Negative for photophobia and discharge.  Respiratory: Negative for cough, shortness of breath and wheezing.   Cardiovascular: Negative for chest pain and palpitations.  Gastrointestinal: Negative for abdominal pain, blood in stool and bowel incontinence.  Genitourinary: Negative for bladder incontinence, dysuria, frequency and hematuria.  Musculoskeletal: Positive for back pain. Negative for arthralgias and neck pain.  Skin: Negative.   Neurological: Positive for tingling and paresthesias. Negative for dizziness, seizures and speech difficulty.  Psychiatric/Behavioral: Negative for confusion and hallucinations.     Physical Exam Updated Vital Signs BP 121/76 (BP Location: Right Arm)   Pulse (!) 107   Temp 98.6 F (37 C) (Oral)   Resp 14   Ht 5\' 5"  (1.651 m)   Wt 94.8 kg   SpO2 94%   BMI 34.78 kg/m   Physical Exam  Constitutional: She is oriented to person, place, and time. She appears well-developed and well-nourished.  Non-toxic appearance.  HENT:  Head: Normocephalic.  Right Ear: Tympanic membrane and external ear normal.  Left Ear: Tympanic membrane and external ear normal.  Eyes: Pupils are equal, round, and reactive to light. EOM and lids are normal.  Neck: Normal range of motion. Neck supple. Carotid bruit is not present.  Cardiovascular: Normal rate, regular rhythm, normal heart sounds, intact distal pulses and normal pulses.  Pulmonary/Chest: Breath sounds normal. No respiratory distress.  Abdominal: Soft. Bowel sounds are normal. There is no tenderness. There is no guarding.  Musculoskeletal: Normal range of motion.       Lumbar back: She exhibits pain and spasm.       Back:  Lymphadenopathy:       Head (right side): No submandibular adenopathy present.       Head (left side): No submandibular adenopathy present.    She has  no cervical adenopathy.  Neurological: She is alert and oriented to person, place, and time. She has normal strength. No cranial nerve deficit or sensory deficit.  Skin: Skin is warm and dry.  Psychiatric: She has a normal mood and affect. Her speech is normal.  Nursing note and vitals reviewed.    ED Treatments / Results  Labs (all labs ordered are listed, but only abnormal results are displayed) Labs Reviewed - No data to display  EKG None  Radiology No results found.  Procedures Procedures (including critical care time)  Medications Ordered in ED Medications - No data to display   Initial Impression / Assessment and Plan / ED Course  I have reviewed the triage vital signs and the nursing notes.  Pertinent labs & imaging results that were available during my care of the patient were reviewed by me and considered in my medical decision making (see chart for details).       Final Clinical Impressions(s) / ED Diagnoses MDM  Vital signs reviewed.  Pulse oximetry is 94% on room air.  Within normal limits by my interpretation.   Patient has had 4 surgeries on her lower back.  She recently bent over, felt a pop, and has been having pain since that time.  There is no evidence of cauda equina present or any other emergent changes.  There is been no loss of bowel or bladder function reported.  Patient has spasm in the right lower back extending into the buttocks area.  Patient will be treated with medication for spasm, inflammation and pain.  I have asked the patient to see her surgeon on tomorrow October 14 to set up a plan concerning her back pain.  The patient is in agreement with this plan.   Final diagnoses:  Muscle spasm of back  Right sided sciatica    ED Discharge Orders         Ordered    dexamethasone (DECADRON) 4 MG tablet  2 times daily with meals     08/12/18 1651    traMADol (ULTRAM) 50 MG tablet     08/12/18 1651    diclofenac (VOLTAREN) 75 MG EC tablet  2  times daily     08/12/18 1651           Ivery Quale, PA-C 08/12/18 1706    Raeford Razor, MD 08/13/18 1356

## 2018-08-12 NOTE — ED Triage Notes (Signed)
Pt c/o RT lower back pain since yesterday. Reports that she bent down to pick up her child and heard a pop.

## 2018-08-13 ENCOUNTER — Encounter (HOSPITAL_COMMUNITY): Payer: Self-pay | Admitting: Emergency Medicine

## 2018-08-28 ENCOUNTER — Encounter: Payer: Self-pay | Admitting: Family Medicine

## 2018-08-28 ENCOUNTER — Ambulatory Visit (INDEPENDENT_AMBULATORY_CARE_PROVIDER_SITE_OTHER): Payer: Medicare Other | Admitting: Family Medicine

## 2018-08-28 VITALS — BP 110/74 | Ht 64.75 in | Wt 220.0 lb

## 2018-08-28 DIAGNOSIS — E119 Type 2 diabetes mellitus without complications: Secondary | ICD-10-CM

## 2018-08-28 DIAGNOSIS — G894 Chronic pain syndrome: Secondary | ICD-10-CM | POA: Diagnosis not present

## 2018-08-28 DIAGNOSIS — Z23 Encounter for immunization: Secondary | ICD-10-CM | POA: Diagnosis not present

## 2018-08-28 DIAGNOSIS — M5416 Radiculopathy, lumbar region: Secondary | ICD-10-CM | POA: Diagnosis not present

## 2018-08-28 DIAGNOSIS — Z794 Long term (current) use of insulin: Secondary | ICD-10-CM | POA: Diagnosis not present

## 2018-08-28 LAB — POCT GLYCOSYLATED HEMOGLOBIN (HGB A1C): Hemoglobin A1C: 7.3 % — AB (ref 4.0–5.6)

## 2018-08-28 MED ORDER — METFORMIN HCL 500 MG PO TABS: 500.0000 mg | ORAL_TABLET | Freq: Two times a day (BID) | ORAL | 5 refills | Status: DC

## 2018-08-28 NOTE — Progress Notes (Signed)
   Subjective:    Patient ID: Janice Wright, female    DOB: 1986-03-25, 32 y.o.   MRN: [id number]  HPI  Patient is here today as a new patient trying to get established with [place]. Patient says she has had Dm for the last eight years and she does not have an endocrinologist and has never seen one. She states her Pcp at Dr. Deitra Mayo office use to control it and her ob also helped keep controled will pregnant. She is taking Metformin 500 mg bid.She eats semi healthy, and gets some exercise two to three times per week.   Seeing numbers in the 150 to 200 area   Mom as hx of diabetes  Haw haf d four back surgeries in the past    Pt was [facility] [place] [facility], taking meds plus injections, came off the pain shots and cold [place] came off the meds   pt on chronic disability with th bavk  Takes flu shots reg  Pt tried to get back into clinic they were charging intense iamount      Review of Systems No headache, no major weight loss or weight gain, no chest pain  abdominal pain no change in bowel habits complete ROS otherwise negative     Results for orders placed or performed in visit on 08/28/18  POCT glycosylated hemoglobin (Hb A1C)  Result Value Ref Range   Hemoglobin A1C 7.3 (A) 4.0 - 5.6 %   HbA1c POC (<> result, manual entry)     HbA1c, POC (prediabetic range)     HbA1c, POC (controlled diabetic range)      Objective:   Physical Exam  Alert and oriented, vitals reviewed and stable, NAD ENT-TM's and ext canals WNL bilat via otoscopic exam Soft palate, tonsils and post pharynx WNL via oropharyngeal exam Neck-symmetric, no masses; thyroid nonpalpable and nontender Pulmonary-no tachypnea or accessory muscle use; Clear without wheezes via auscultation Card--no abnrml murmurs, rhythm reg and rate WNL Carotid pulses symmetric, without bruits       Assessment & Plan:  Impression type 2 diabetes.  8 years duration.  Claims compliance with medication.  Has not had  flu shot yet.  Admits fair diet but not awesome diet.  A1c good but not perfect.  Discussed.  Compliance with diet and exercise discussed and encouraged  2.  Chronic back pain with history of sciatica multiple back surgeries.  Patient had prior back clinic who is no longer seeing her because of nonpayment.  Needs referral to back pain specialist will proceed  Flu shot Medications will  Up in 6 months

## 2018-09-03 ENCOUNTER — Encounter: Payer: Self-pay | Admitting: Family Medicine

## 2018-10-01 ENCOUNTER — Encounter: Payer: Self-pay | Admitting: Family Medicine

## 2018-11-01 ENCOUNTER — Encounter: Payer: Self-pay | Admitting: Family Medicine

## 2018-11-18 ENCOUNTER — Encounter (HOSPITAL_COMMUNITY): Payer: Self-pay

## 2018-11-18 ENCOUNTER — Emergency Department (HOSPITAL_COMMUNITY)
Admission: EM | Admit: 2018-11-18 | Discharge: 2018-11-18 | Disposition: A | Discharge status: Home / Self Care | Payer: Medicare Other | Attending: Emergency Medicine | Admitting: Emergency Medicine

## 2018-11-18 ENCOUNTER — Emergency Department (HOSPITAL_COMMUNITY): Payer: Medicare Other

## 2018-11-18 ENCOUNTER — Other Ambulatory Visit: Payer: Self-pay

## 2018-11-18 DIAGNOSIS — E785 Hyperlipidemia, unspecified: Secondary | ICD-10-CM | POA: Insufficient documentation

## 2018-11-18 DIAGNOSIS — F17210 Nicotine dependence, cigarettes, uncomplicated: Secondary | ICD-10-CM | POA: Diagnosis not present

## 2018-11-18 DIAGNOSIS — E119 Type 2 diabetes mellitus without complications: Secondary | ICD-10-CM | POA: Insufficient documentation

## 2018-11-18 DIAGNOSIS — Z79899 Other long term (current) drug therapy: Secondary | ICD-10-CM | POA: Diagnosis not present

## 2018-11-18 DIAGNOSIS — J189 Pneumonia, unspecified organism: Secondary | ICD-10-CM | POA: Diagnosis not present

## 2018-11-18 DIAGNOSIS — R05 Cough: Secondary | ICD-10-CM | POA: Diagnosis present

## 2018-11-18 LAB — INFLUENZA PANEL BY PCR (TYPE A & B)
Influenza A By PCR: NEGATIVE
Influenza B By PCR: NEGATIVE

## 2018-11-18 MED ORDER — BENZONATATE 100 MG PO CAPS
200.0000 mg | ORAL_CAPSULE | Freq: Once | ORAL | Status: AC
  Administered 2018-11-18: 200 mg via ORAL
  Filled 2018-11-18: qty 2

## 2018-11-18 MED ORDER — AMOXICILLIN-POT CLAVULANATE 875-125 MG PO TABS: 1.0000 | ORAL_TABLET | Freq: Two times a day (BID) | ORAL | 0 refills | Status: DC

## 2018-11-18 MED ORDER — AZITHROMYCIN 250 MG PO TABS: 250.0000 mg | ORAL_TABLET | Freq: Every day | ORAL | 0 refills | Status: DC

## 2018-11-18 MED ORDER — AMOXICILLIN-POT CLAVULANATE 875-125 MG PO TABS
1.0000 | ORAL_TABLET | Freq: Once | ORAL | Status: AC
  Administered 2018-11-18: 1 via ORAL
  Filled 2018-11-18: qty 1

## 2018-11-18 MED ORDER — BENZONATATE 100 MG PO CAPS: 100.0000 mg | ORAL_CAPSULE | Freq: Three times a day (TID) | ORAL | 0 refills | Status: DC

## 2018-11-18 NOTE — Discharge Instructions (Signed)
Take the following medicines:   Augmentin twice daily  Zithromax daily for 5 days Tessalon every 8 hours as needed for coughing.  Thank you for letting us take care of you today!  Please obtain all of your results from medical records or have your doctors office obtain the results - share them with your doctor - you should be seen at your doctors office in the next 2 days. Call today to arrange your follow up. Take the medications as prescribed. Please review all of the medicines and only take them if you do not have an allergy to them. Please be aware that if you are taking birth control pills, taking other prescriptions, ESPECIALLY ANTIBIOTICS may make the birth control ineffective - if this is the case, either do not engage in sexual activity or use alternative methods of birth control such as condoms until you have finished the medicine and your family doctor says it is OK to restart them. If you are on a blood thinner such as COUMADIN, be aware that any other medicine that you take may cause the coumadin to either work too much, or not enough - you should have your coumadin level rechecked in next 7 days if this is the case.  ?  It is also a possibility that you have an allergic reaction to any of the medicines that you have been prescribed - Everybody reacts differently to medications and while MOST people have no trouble with most medicines, you may have a reaction such as nausea, vomiting, rash, swelling, shortness of breath. If this is the case, please stop taking the medicine immediately and contact your physician.   If you were given a medication in the ED such as percocet, vicodin, or morphine, be aware that these medicines are sedating and may change your ability to take care of yourself adequately for several hours after being given this medicines - you should not drive or take care of small children if you were given this medicine in the Emergency Department or if you have been prescribed  these types of medicines. ?   You should return to the ER IMMEDIATELY if you develop severe or worsening symptoms.

## 2018-11-18 NOTE — ED Triage Notes (Signed)
Pt c/o cough, fever, general body aches, & sore throat that began last Thursday. States daughter has had bronchitis. Has taken tylenol/motrin/robutussin with no relief

## 2018-11-18 NOTE — ED Provider Notes (Signed)
[facility] EMERGENCY DEPARTMENT Provider Note   CSN: [id number] Arrival date & time: 11/18/18  1240     History   Chief Complaint Chief Complaint  Patient presents with  . Cough    HPI Janice Wright is a 33 y.o. female.  HPI  33 year old female, she has a known history of diabetes type 2, hyperlipidemia and presents to the hospital with 3 days of what she describes as fevers, coughing and increased amounts of chest discomfort.  She has associated whole-body myalgias, headache and has now developed a sore throat.  She does report 1 of her children at home has had similar symptoms and was told that it was some type of viral bronchitis, not on antibiotics.  There is another child who is started to cough but is not developed a fever.  She has not had any medications prior to arrival.  She reports that the more she coughs more her lower back hurts as well as her chest.  At rest the patient has myalgias and fevers as maximum 101.9.  She reports that she was compliant with vaccinations for influenza this year  Past Medical History:  Diagnosis Date  . Anxiety   . Chronic lower back pain   . Constipation due to opioid therapy   . Depression 2014   hx; "resolved"  . Fatty liver   . Hyperlipidemia   . Polycystic ovarian syndrome   . PONV (postoperative nausea and vomiting)   . Type II diabetes mellitus (HCC) dx'd 2014    Patient Active Problem List   Diagnosis Date Noted  . Acute cystitis without hematuria   . Acute pyelonephritis   . Hypotension   . Sepsis (HCC) 09/26/2017  . Sepsis secondary to UTI (HCC) 09/26/2017  . Dehydration 01/20/2017  . Bronchitis, acute 01/20/2017  . Gastroenteritis 01/20/2017  . Tachycardia 01/20/2017  . Chronic pain syndrome 01/20/2017  . Radiculopathy 04/01/2015    Past Surgical History:  Procedure Laterality Date  . BACK SURGERY     x 2  . BACK SURGERY    . CESAREAN SECTION  2011  . CESAREAN SECTION    . CHOLECYSTECTOMY    . KNEE  ARTHROSCOPY Left 2007  . LAPAROSCOPIC CHOLECYSTECTOMY  2006  . LUMBAR DISC SURGERY  08/2008; 12/2010  . POSTERIOR LUMBAR FUSION  10/2012  . POSTERIOR LUMBAR FUSION  04/01/2015   revision/notes 04/01/2015  . WISDOM TOOTH EXTRACTION     "2 @ a time"     OB History    Gravida  1   Para  0   Term  0   Preterm  0   AB  0   Living        SAB  0   TAB  0   Ectopic  0   Multiple      Live Births               Home Medications    Prior to Admission medications   Medication Sig Start Date End Date Taking? Authorizing Provider  amoxicillin-clavulanate (AUGMENTIN) 875-125 MG tablet Take 1 tablet by mouth every 12 (twelve) hours. 11/18/18   Eber HongMiller, Bernadene Garside, MD  azithromycin (ZITHROMAX Z-PAK) 250 MG tablet Take 1 tablet (250 mg total) by mouth daily. 500mg  PO day 1, then 250mg  PO days 205 11/18/18   Eber HongMiller, Aavya Shafer, MD  benzonatate (TESSALON) 100 MG capsule Take 1 capsule (100 mg total) by mouth every 8 (eight) hours. 11/18/18   Eber HongMiller, Jimmie Rueter, MD  metFORMIN (  GLUCOPHAGE) 500 MG tablet Take 1 tablet (500 mg total) by mouth 2 (two) times daily with a meal. 08/28/18   Merlyn Albert, MD    Family History History reviewed. No pertinent family history.  Social History Social History   Tobacco Use  . Smoking status: Current Every Day Smoker    Packs/day: 0.50  . Smokeless tobacco: Never Used  Substance Use Topics  . Alcohol use: Not Currently  . Drug use: No     Allergies   Patient has no known allergies.   Review of Systems Review of Systems  All other systems reviewed and are negative.    Physical Exam Updated Vital Signs BP (!) 143/87 (BP Location: Right Arm)   Pulse (!) 114   Temp 98 F (36.7 C) (Oral)   Resp 18   Ht 1.626 m (5\' 4" )   Wt 99.8 kg   LMP 11/11/2018 (Approximate)   SpO2 97%   Breastfeeding Unknown   BMI 37.76 kg/m   Physical Exam Vitals signs and nursing note reviewed.  Constitutional:      General: She is not in acute distress.     Appearance: She is well-developed.  HENT:     Head: Normocephalic and atraumatic.     Comments: Clear tympanic membranes bilaterally    Mouth/Throat:     Pharynx: No oropharyngeal exudate.     Comments: Normal-appearing mouth and posterior pharynx Eyes:     General: No scleral icterus.       Right eye: No discharge.        Left eye: No discharge.     Conjunctiva/sclera: Conjunctivae normal.     Pupils: Pupils are equal, round, and reactive to light.  Neck:     Musculoskeletal: Normal range of motion and neck supple.     Thyroid: No thyromegaly.     Vascular: No JVD.  Cardiovascular:     Rate and Rhythm: Regular rhythm. Tachycardia present.     Heart sounds: Normal heart sounds. No murmur. No friction rub. No gallop.      Comments: Mild tachycardia Pulmonary:     Effort: Pulmonary effort is normal. No respiratory distress.     Breath sounds: Normal breath sounds. No wheezing or rales.     Comments: Lungs are clear, there is no rales wheezing or increased work of breathing.  She is not tachypneic or using accessory muscles and speaks in full sentences Abdominal:     General: Bowel sounds are normal. There is no distension.     Palpations: Abdomen is soft. There is no mass.     Tenderness: There is no abdominal tenderness.  Musculoskeletal: Normal range of motion.        General: No tenderness.  Lymphadenopathy:     Cervical: No cervical adenopathy.  Skin:    General: Skin is warm and dry.     Findings: No erythema or rash.  Neurological:     Mental Status: She is alert.     Coordination: Coordination normal.  Psychiatric:        Behavior: Behavior normal.      ED Treatments / Results  Labs (all labs ordered are listed, but only abnormal results are displayed) Labs Reviewed  INFLUENZA PANEL BY PCR (TYPE A & B)    EKG None  Radiology Dg Chest 2 View  Result Date: 11/18/2018 CLINICAL DATA:  33 year old female with cough fever sore throat and body ache. Sick  contacts. EXAM: CHEST - 2 VIEW COMPARISON:  01/20/2017.  FINDINGS: Chronically somewhat low lung volumes. Normal cardiac size and mediastinal contours. Visualized tracheal air column is within normal limits. Multifocal increased bilateral lung streaky and slightly nodular opacity, most pronounced in the right upper lobe. No pleural effusion. No pneumothorax or pulmonary edema. No acute osseous abnormality identified. Negative visible bowel gas pattern. IMPRESSION: Multifocal bilateral pulmonary opacity suggesting acute respiratory infection in a multilobar bronchopneumonia pattern. No pleural effusion. Electronically Signed   By: Odessa FlemingH  Hall M.D.   On: 11/18/2018 13:49    Procedures Procedures (including critical care time)  Medications Ordered in ED Medications  amoxicillin-clavulanate (AUGMENTIN) 875-125 MG per tablet 1 tablet (has no administration in time range)  benzonatate (TESSALON) capsule 200 mg (200 mg Oral Given 11/18/18 1322)     Initial Impression / Assessment and Plan / ED Course  I have reviewed the triage vital signs and the nursing notes.  Pertinent labs & imaging results that were available during my care of the patient were reviewed by me and considered in my medical decision making (see chart for details).  Clinical Course as of Nov 18 1433  Sun Nov 18, 2018  1432 I ihave personally looked at the chest xray - it shows that she has multifocal infiltrates - She has no fever and normal BP - will need antibiotics - flu negative, pt agreeable.     [BM]    Clinical Course User Index [BM] Eber HongMiller, Nolen Lindamood, MD   As a diabetic the patient is at risk for complications from the flu.  She is 72 hours into the illness but still quite symptomatic and seeming to have more coughing and chest discomfort.  Will rule out pneumonia as a potential complication of the flu, the patient is otherwise not appearing to be in distress.  Antitussive will be given, she is not febrile here.  Final Clinical  Impressions(s) / ED Diagnoses   Final diagnoses:  Community acquired pneumonia, unspecified laterality    ED Discharge Orders         Ordered    amoxicillin-clavulanate (AUGMENTIN) 875-125 MG tablet  Every 12 hours     11/18/18 1434    azithromycin (ZITHROMAX Z-PAK) 250 MG tablet  Daily     11/18/18 1434    benzonatate (TESSALON) 100 MG capsule  Every 8 hours     11/18/18 1434           Eber HongMiller, Krisna Omar, MD 11/18/18 1435

## 2018-12-08 ENCOUNTER — Emergency Department (HOSPITAL_COMMUNITY): Payer: Medicare Other

## 2018-12-08 ENCOUNTER — Emergency Department (HOSPITAL_COMMUNITY)
Admission: EM | Admit: 2018-12-08 | Discharge: 2018-12-08 | Disposition: A | Discharge status: Home / Self Care | Payer: Medicare Other | Attending: Emergency Medicine | Admitting: Emergency Medicine

## 2018-12-08 ENCOUNTER — Encounter (HOSPITAL_COMMUNITY): Payer: Self-pay | Admitting: Emergency Medicine

## 2018-12-08 DIAGNOSIS — E119 Type 2 diabetes mellitus without complications: Secondary | ICD-10-CM | POA: Diagnosis not present

## 2018-12-08 DIAGNOSIS — Z7984 Long term (current) use of oral hypoglycemic drugs: Secondary | ICD-10-CM | POA: Insufficient documentation

## 2018-12-08 DIAGNOSIS — F17200 Nicotine dependence, unspecified, uncomplicated: Secondary | ICD-10-CM | POA: Insufficient documentation

## 2018-12-08 DIAGNOSIS — L03315 Cellulitis of perineum: Secondary | ICD-10-CM | POA: Diagnosis not present

## 2018-12-08 DIAGNOSIS — R600 Localized edema: Secondary | ICD-10-CM | POA: Diagnosis present

## 2018-12-08 DIAGNOSIS — R739 Hyperglycemia, unspecified: Secondary | ICD-10-CM

## 2018-12-08 HISTORY — DX: Radiculopathy, site unspecified: M54.10

## 2018-12-08 LAB — BASIC METABOLIC PANEL
Anion gap: 9 (ref 5–15)
BUN: 7 mg/dL (ref 6–20)
CO2: 19 mmol/L — ABNORMAL LOW (ref 22–32)
CREATININE: 0.58 mg/dL (ref 0.44–1.00)
Calcium: 8.8 mg/dL — ABNORMAL LOW (ref 8.9–10.3)
Chloride: 104 mmol/L (ref 98–111)
GFR calc Af Amer: >60 mL/min (ref >60)
GFR calc non Af Amer: >60 mL/min (ref >60)
Glucose, Bld: 323 mg/dL — ABNORMAL HIGH (ref 70–99)
Potassium: 4 mmol/L (ref 3.5–5.1)
Sodium: 132 mmol/L — ABNORMAL LOW (ref 135–145)

## 2018-12-08 LAB — CBC WITH DIFFERENTIAL/PLATELET
Abs Immature Granulocytes: 0.09 10*3/uL — ABNORMAL HIGH (ref 0.00–0.07)
Basophils Absolute: 0.1 10*3/uL (ref 0.0–0.1)
Basophils Relative: 0 %
Eosinophils Absolute: 0.2 10*3/uL (ref 0.0–0.5)
Eosinophils Relative: 1 %
HCT: 46.3 % — ABNORMAL HIGH (ref 36.0–46.0)
Hemoglobin: 14.7 g/dL (ref 12.0–15.0)
Immature Granulocytes: 1 %
Lymphocytes Relative: 11 %
Lymphs Abs: 1.4 10*3/uL (ref 0.7–4.0)
MCH: 27.9 pg (ref 26.0–34.0)
MCHC: 31.7 g/dL (ref 30.0–36.0)
MCV: 88 fL (ref 80.0–100.0)
MONO ABS: 0.8 10*3/uL (ref 0.1–1.0)
MONOS PCT: 6 %
Neutro Abs: 11 10*3/uL — ABNORMAL HIGH (ref 1.7–7.7)
Neutrophils Relative %: 81 %
Platelets: 287 10*3/uL (ref 150–400)
RBC: 5.26 MIL/uL — AB (ref 3.87–5.11)
RDW: 13.2 % (ref 11.5–15.5)
WBC: 13.5 10*3/uL — ABNORMAL HIGH (ref 4.0–10.5)
nRBC: 0 % (ref 0.0–0.2)

## 2018-12-08 LAB — URINALYSIS, ROUTINE W REFLEX MICROSCOPIC
Bacteria, UA: NONE SEEN
Bilirubin Urine: NEGATIVE
Glucose, UA: >=500 mg/dL — AB
Hgb urine dipstick: NEGATIVE
KETONES UR: 5 mg/dL — AB
Leukocytes, UA: NEGATIVE
Nitrite: NEGATIVE
PH: 6 (ref 5.0–8.0)
Protein, ur: NEGATIVE mg/dL
Specific Gravity, Urine: 1.041 — ABNORMAL HIGH (ref 1.005–1.030)

## 2018-12-08 LAB — LACTIC ACID, PLASMA
Lactic Acid, Venous: 1.7 mmol/L (ref 0.5–1.9)
Lactic Acid, Venous: 1.3 mmol/L (ref 0.5–1.9)

## 2018-12-08 LAB — PREGNANCY, URINE: Preg Test, Ur: NEGATIVE

## 2018-12-08 MED ORDER — SODIUM CHLORIDE 0.9 % IV BOLUS
1000.0000 mL | Freq: Once | INTRAVENOUS | Status: AC
  Administered 2018-12-08: 1000 mL via INTRAVENOUS

## 2018-12-08 MED ORDER — MORPHINE SULFATE (PF) 4 MG/ML IV SOLN
4.0000 mg | INTRAVENOUS | Status: DC | PRN
  Administered 2018-12-08: 4 mg via INTRAVENOUS
  Filled 2018-12-08: qty 1

## 2018-12-08 MED ORDER — ONDANSETRON HCL 4 MG/2ML IJ SOLN
4.0000 mg | INTRAMUSCULAR | Status: DC | PRN
  Administered 2018-12-08: 4 mg via INTRAVENOUS
  Filled 2018-12-08: qty 2

## 2018-12-08 MED ORDER — CLINDAMYCIN HCL 150 MG PO CAPS: ORAL_CAPSULE | ORAL | 0 refills | Status: DC

## 2018-12-08 MED ORDER — FENTANYL CITRATE (PF) 100 MCG/2ML IJ SOLN
50.0000 ug | INTRAMUSCULAR | Status: AC | PRN
  Administered 2018-12-08 (×2): 50 ug via INTRAVENOUS
  Filled 2018-12-08 (×2): qty 2

## 2018-12-08 MED ORDER — PIPERACILLIN-TAZOBACTAM 3.375 G IVPB 30 MIN
3.3750 g | Freq: Once | INTRAVENOUS | Status: AC
  Administered 2018-12-08: 3.375 g via INTRAVENOUS
  Filled 2018-12-08: qty 50

## 2018-12-08 MED ORDER — IOHEXOL 300 MG/ML  SOLN
100.0000 mL | Freq: Once | INTRAMUSCULAR | Status: AC | PRN
  Administered 2018-12-08: 100 mL via INTRAVENOUS

## 2018-12-08 NOTE — ED Notes (Signed)
Pt is leaving with her husband. States she does not have anybody to watch her children. Dr. Clarene Duke has explained the risks of leaving AMA.

## 2018-12-08 NOTE — ED Provider Notes (Signed)
[facility] EMERGENCY DEPARTMENT Provider Note   CSN: [id number] Arrival date & time: 12/08/18  6226     History   Chief Complaint Chief Complaint  Patient presents with  . Abscess    HPI Janice Wright is a 33 y.o. female.  HPI  Pt was seen at 0850. Per pt and her husband, c/o gradual onset and worsening of persistent perineal "pain" that began 3 days ago. Pt states she first felt the pain after having a BM. Pt states she then began to feel "a lump" next to her rectum, which has now progressed into her perineum and right labial areas. Pt woke up at 4am today with fever of "103." Pt took OTC tylenol PTA. Pt states she has been taking OTC vaginal yeast product for a vaginal yeast infection this past week. Denies feeling "lump" in vaginal area. Pt denies rectal injury, no rectal discharge, no vaginal bleeding/discharge, no vaginal swelling, no dysuria/hematuria, no N/V/D, no abd pain, no back/flank pain.     Past Medical History:  Diagnosis Date  . Anxiety   . Chronic lower back pain   . Constipation due to opioid therapy   . Depression 2014   hx; "resolved"  . Fatty liver   . Hyperlipidemia   . Polycystic ovarian syndrome   . PONV (postoperative nausea and vomiting)   . Radiculopathy   . Type II diabetes mellitus (HCC) dx'd 2014    Patient Active Problem List   Diagnosis Date Noted  . Acute cystitis without hematuria   . Acute pyelonephritis   . Hypotension   . Sepsis (HCC) 09/26/2017  . Sepsis secondary to UTI (HCC) 09/26/2017  . Dehydration 01/20/2017  . Bronchitis, acute 01/20/2017  . Gastroenteritis 01/20/2017  . Tachycardia 01/20/2017  . Chronic pain syndrome 01/20/2017  . Radiculopathy 04/01/2015    Past Surgical History:  Procedure Laterality Date  . BACK SURGERY     x 2  . BACK SURGERY    . CESAREAN SECTION  2011  . CESAREAN SECTION    . CHOLECYSTECTOMY    . KNEE ARTHROSCOPY Left 2007  . LAPAROSCOPIC CHOLECYSTECTOMY  2006  . LUMBAR DISC SURGERY   08/2008; 12/2010  . POSTERIOR LUMBAR FUSION  10/2012  . POSTERIOR LUMBAR FUSION  04/01/2015   revision/notes 04/01/2015  . WISDOM TOOTH EXTRACTION     "2 @ a time"     OB History    Gravida  1   Para  0   Term  0   Preterm  0   AB  0   Living        SAB  0   TAB  0   Ectopic  0   Multiple      Live Births               Home Medications    Prior to Admission medications   Medication Sig Start Date End Date Taking? Authorizing Provider  meloxicam (MOBIC) 7.5 MG tablet TAKE ONE TABLET (7.5 MG DOSE) BY MOUTH DAILY. 11/28/18  Yes [provider]  metFORMIN (GLUCOPHAGE) 500 MG tablet Take 1 tablet (500 mg total) by mouth 2 (two) times daily with a meal. 08/28/18  Yes Merlyn Albert, MD  pregabalin (LYRICA) 100 MG capsule Take by mouth. 11/28/18 02/26/19 Yes [provider]  amoxicillin-clavulanate (AUGMENTIN) 875-125 MG tablet Take 1 tablet by mouth every 12 (twelve) hours. Patient not taking: Reported on 12/08/2018 11/18/18   Eber Hong, MD  azithromycin [facility]  Z-PAK) 250 MG tablet Take 1 tablet (250 mg total) by mouth daily. 500mg  PO day 1, then 250mg  PO days 205 Patient not taking: Reported on 12/08/2018 11/18/18   Eber Hong, MD  benzonatate (TESSALON) 100 MG capsule Take 1 capsule (100 mg total) by mouth every 8 (eight) hours. Patient not taking: Reported on 12/08/2018 11/18/18   Eber Hong, MD    Family History History reviewed. No pertinent family history.  Social History Social History   Tobacco Use  . Smoking status: Current Every Day Smoker    Packs/day: 0.50  . Smokeless tobacco: Never Used  Substance Use Topics  . Alcohol use: Not Currently  . Drug use: No     Allergies   Patient has no known allergies.   Review of Systems Review of Systems ROS: Statement: All systems negative except as marked or noted in the HPI; Constitutional: Negative for fever and chills. ; ; Eyes: Negative for eye pain, redness and discharge. ; ;  ENMT: Negative for ear pain, hoarseness, nasal congestion, sinus pressure and sore throat. ; ; Cardiovascular: Negative for chest pain, palpitations, diaphoresis, dyspnea and peripheral edema. ; ; Respiratory: Negative for cough, wheezing and stridor. ; ; Gastrointestinal: Negative for nausea, vomiting, diarrhea, abdominal pain, blood in stool, hematemesis, jaundice and rectal bleeding. . ; ; Genitourinary: Negative for dysuria, flank pain and hematuria. ; GYN:  No pelvic pain, no vaginal bleeding, no vaginal discharge. ;; ; Musculoskeletal: Negative for back pain and neck pain. Negative for swelling and trauma.; ; Skin: +rectal pain, perineal pain, labial pain. Negative for pruritus, abrasions, blisters, bruising and skin lesion.; ; Neuro: Negative for headache, lightheadedness and neck stiffness. Negative for weakness, altered level of consciousness, altered mental status, extremity weakness, paresthesias, involuntary movement, seizure and syncope.       Physical Exam Updated Vital Signs BP (!) 102/58   Pulse (!) 120   Temp 98.6 F (37 C) (Oral)   Resp 12   Ht 5\' 4"  (1.626 m)   Wt 104.3 kg   LMP 11/11/2018 (Approximate)   SpO2 95%   BMI 39.48 kg/m    BP 139/82   Pulse (!) 129   Temp 98.6 F (37 C) (Oral)   Resp 12   Ht 5\' 4"  (1.626 m)   Wt 104.3 kg   LMP 11/11/2018 (Approximate)   SpO2 93%   BMI 39.48 kg/m    Physical Exam 0855: Physical examination:  Nursing notes reviewed; Vital signs and O2 SAT reviewed;  Constitutional: Well developed, Well nourished, Well hydrated, Uncomfortable appearing.; Head:  Normocephalic, atraumatic; Eyes: EOMI, PERRL, No scleral icterus; ENMT: Mouth and pharynx normal, Mucous membranes moist; Neck: Supple, Full range of motion, No lymphadenopathy; Cardiovascular: Tachycardic rate and rhythm, No gallop; Respiratory: Breath sounds clear & equal bilaterally, No wheezes.  Speaking full sentences with ease, Normal respiratory effort/excursion; Chest:  Nontender, Movement normal; Abdomen: Soft, Nontender, Nondistended, Normal bowel sounds; Genitourinary: No CVA tenderness. Rectal and perineal exam performed w/permission of pt and ED RN chaperone present:  Anal tone normal, +palp mass and erythema to right of rectal area, extending into perineum and right labia, no obvious mass on rectal exam (though pt c/o pain to right of rectal vault), no palp mass in vaginal area, non-tender. No open wounds, no soft tissue crepitus, no central pointing area..;;;; Extremities: Peripheral pulses normal, No tenderness, No edema, No calf edema or asymmetry.; Neuro: AA&Ox3, Major CN grossly intact.  Speech clear. No gross focal motor or sensory deficits in  extremities.; Skin: Color normal, Warm, Dry.   ED Treatments / Results  Labs (all labs ordered are listed, but only abnormal results are displayed)   EKG None  Radiology   Procedures Procedures (including critical care time)  Medications Ordered in ED Medications  fentaNYL (SUBLIMAZE) injection 50 mcg (50 mcg Intravenous Given 12/08/18 0926)  sodium chloride 0.9 % bolus 1,000 mL (1,000 mLs Intravenous New Bag/Given 12/08/18 9562)     Initial Impression / Assessment and Plan / ED Course  I have reviewed the triage vital signs and the nursing notes.  Pertinent labs & imaging results that were available during my care of the patient were reviewed by me and considered in my medical decision making (see chart for details).  MDM Reviewed: previous chart, nursing note and vitals Reviewed previous: labs Interpretation: labs and CT scan    Results for orders placed or performed during the hospital encounter of 12/08/18  Culture, blood (routine x 2)  Result Value Ref Range   Specimen Description      RIGHT ANTECUBITAL BOTTLES DRAWN AEROBIC AND ANAEROBIC   Special Requests      Blood Culture adequate volume Performed at [facility], [address] [place], [place] [address]    Culture PENDING     Report Status PENDING   Culture, blood (routine x 2)  Result Value Ref Range   Specimen Description      LEFT ANTECUBITAL BOTTLES DRAWN AEROBIC AND ANAEROBIC   Special Requests      Blood Culture adequate volume Performed at [facility], [address], [place], [place] [address]    Culture PENDING    Report Status PENDING   Pregnancy, urine  Result Value Ref Range   Preg Test, Ur NEGATIVE NEGATIVE  Urinalysis, Routine w reflex microscopic  Result Value Ref Range   Color, Urine YELLOW YELLOW   APPearance CLEAR CLEAR   Specific Gravity, Urine 1.041 (H) 1.005 - 1.030   pH 6.0 5.0 - 8.0   Glucose, UA >=500 (A) NEGATIVE mg/dL   Hgb urine dipstick NEGATIVE NEGATIVE   Bilirubin Urine NEGATIVE NEGATIVE   Ketones, ur 5 (A) NEGATIVE mg/dL   Protein, ur NEGATIVE NEGATIVE mg/dL   Nitrite NEGATIVE NEGATIVE   Leukocytes, UA NEGATIVE NEGATIVE   RBC / HPF 0-5 0 - 5 RBC/hpf   WBC, UA 0-5 0 - 5 WBC/hpf   Bacteria, UA NONE SEEN NONE SEEN   Squamous Epithelial / LPF 0-5 0 - 5  Basic metabolic panel  Result Value Ref Range   Sodium 132 (L) 135 - 145 mmol/L   Potassium 4.0 3.5 - 5.1 mmol/L   Chloride 104 98 - 111 mmol/L   CO2 19 (L) 22 - 32 mmol/L   Glucose, Bld 323 (H) 70 - 99 mg/dL   BUN 7 6 - 20 mg/dL   Creatinine, Ser 9.62 0.44 - 1.00 mg/dL   Calcium 8.8 (L) 8.9 - 10.3 mg/dL   GFR calc non Af Amer >60 >60 mL/min   GFR calc Af Amer >60 >60 mL/min   Anion gap 9 5 - 15  Lactic acid, plasma  Result Value Ref Range   Lactic Acid, Venous 1.7 0.5 - 1.9 mmol/L  Lactic acid, plasma  Result Value Ref Range   Lactic Acid, Venous 1.3 0.5 - 1.9 mmol/L  CBC with Differential  Result Value Ref Range   WBC 13.5 (H) 4.0 - 10.5 K/uL   RBC 5.26 (H) 3.87 - 5.11 MIL/uL   Hemoglobin 14.7 12.0 -  15.0 g/dL   HCT 78.246.3 (H) 95.636.0 - 21.346.0 %   MCV 88.0 80.0 - 100.0 fL   MCH 27.9 26.0 - 34.0 pg   MCHC 31.7 30.0 - 36.0 g/dL   RDW 08.613.2 57.811.5 - 46.915.5 %   Platelets 287 150 - 400 K/uL   nRBC 0.0 0.0 - 0.2 %    Neutrophils Relative % 81 %   Neutro Abs 11.0 (H) 1.7 - 7.7 K/uL   Lymphocytes Relative 11 %   Lymphs Abs 1.4 0.7 - 4.0 K/uL   Monocytes Relative 6 %   Monocytes Absolute 0.8 0.1 - 1.0 K/uL   Eosinophils Relative 1 %   Eosinophils Absolute 0.2 0.0 - 0.5 K/uL   Basophils Relative 0 %   Basophils Absolute 0.1 0.0 - 0.1 K/uL   Immature Granulocytes 1 %   Abs Immature Granulocytes 0.09 (H) 0.00 - 0.07 K/uL    Ct Pelvis W Contrast Result Date: 12/08/2018 CLINICAL DATA:  Per ED note. Pt states she has an abscess on her perineum beginning about 3 days ago. Woke up with temp of 103 this morning. Took 2 Tylenol PTA. Hx of diabetes w/metformin. History of diabetes. LMP 11/03/2018. EXAM: CT PELVIS WITH CONTRAST TECHNIQUE: Multidetector CT imaging of the pelvis was performed using the standard protocol following the bolus administration of intravenous contrast. CONTRAST:  100mL OMNIPAQUE IOHEXOL 300 MG/ML  SOLN COMPARISON:  09/26/2017 FINDINGS: Urinary Tract: Visualized LOWER ureters and urinary bladder are unremarkable in appearance. Bowel:  Unremarkable visualized pelvic bowel loops. Vascular/Lymphatic: No pathologically enlarged lymph nodes. No significant vascular abnormality seen. Reproductive: Uterus is present. LEFT ovarian follicle. No suspicious adnexal mass. Within the RIGHT posterior perineum, there is indistinct area of soft tissue density measuring 2.3 x 5.1 centimeters and correlated with induration. No definite abscess identified. There is no superior extension. No radiopaque foreign body or soft tissue gas. Other: No free pelvic fluid. Anterior abdominal wall is unremarkable. Musculoskeletal: Posterior fusion of the lumbosacral spine. IMPRESSION: Ill-defined soft tissue density in the RIGHT posterior perineum measuring 2.3 x 5.1 centimeters correlating with induration. No definite abscess identified. Electronically Signed   By: Norva PavlovElizabeth  Brown M.D.   On: 12/08/2018 12:23    1235:  CT without  definitive abscess. Will start IV zosyn for cellulitis.  CBG's elevated, but AG normal.  Dx and testing d/w pt.  Questions answered.  Verb understanding, agreeable to admit. T/C returned from General Surgery Dr. Lovell SheehanJenkins, case discussed, including:  HPI, pertinent PM/SHx, VS/PE, dx testing, ED course and treatment:  OK with abx, agreeable to consult tomorrow.   1325:  Pt now states that she cannot stay for admission. ED RN and I had extensive conversation with pt regarding risks of leaving AMA. Pt now refuses admission.  I encouraged pt to stay, continues to refuse.  Pt makes her own medical decisions.  Risks of AMA explained to pt, including, but not limited to:  Perineal abscess, fournier gangrene, stroke, heart attack, cardiac arrythmia ("irregular heart rate/beat"), "passing out," temporary and/or permanent disability, death.  Pt verb understanding and continues to refuse admission, understanding the consequences of her decision.  I encouraged pt to follow up with her PMD and the General Surgeon on Monday, and return to the ED immediately if symptoms worsen, she changes her mind, or for any other concerns. Strict return precautions given. Pt verb understanding, agreeable.      Final Clinical Impressions(s) / ED Diagnoses   Final diagnoses:  None    ED Discharge  Orders    None       Samuel JesterMcManus, Celestino Ackerman, DO 12/10/18 1503

## 2018-12-08 NOTE — ED Triage Notes (Signed)
Pt states she has an abscess on her perineum beginning about 3 days ago.  Woke up with temp of 103 this morning.  Took 2 tylenol pta.

## 2018-12-08 NOTE — ED Notes (Signed)
Pt to CT

## 2018-12-08 NOTE — Discharge Instructions (Addendum)
Take the prescription as directed.  Call your regular medical doctor and the General Surgeon on Monday to schedule a follow up appointment for Monday.  Return to the Emergency Department immediately sooner if worsening, you change your mind regarding admission or for any other concerns.

## 2018-12-10 LAB — URINE CULTURE: Culture: NO GROWTH

## 2018-12-13 ENCOUNTER — Encounter (HOSPITAL_COMMUNITY): Payer: Self-pay | Admitting: Emergency Medicine

## 2018-12-13 ENCOUNTER — Other Ambulatory Visit: Payer: Self-pay

## 2018-12-13 ENCOUNTER — Emergency Department (HOSPITAL_COMMUNITY)
Admission: EM | Admit: 2018-12-13 | Discharge: 2018-12-13 | Disposition: A | Discharge status: Home / Self Care | Payer: Medicare Other | Attending: Emergency Medicine | Admitting: Emergency Medicine

## 2018-12-13 DIAGNOSIS — F17210 Nicotine dependence, cigarettes, uncomplicated: Secondary | ICD-10-CM | POA: Insufficient documentation

## 2018-12-13 DIAGNOSIS — Z7984 Long term (current) use of oral hypoglycemic drugs: Secondary | ICD-10-CM | POA: Diagnosis not present

## 2018-12-13 DIAGNOSIS — L02215 Cutaneous abscess of perineum: Secondary | ICD-10-CM | POA: Diagnosis not present

## 2018-12-13 DIAGNOSIS — E119 Type 2 diabetes mellitus without complications: Secondary | ICD-10-CM | POA: Diagnosis not present

## 2018-12-13 DIAGNOSIS — Z79899 Other long term (current) drug therapy: Secondary | ICD-10-CM | POA: Insufficient documentation

## 2018-12-13 DIAGNOSIS — R102 Pelvic and perineal pain: Secondary | ICD-10-CM | POA: Diagnosis present

## 2018-12-13 DIAGNOSIS — K610 Anal abscess: Secondary | ICD-10-CM

## 2018-12-13 LAB — CBC WITH DIFFERENTIAL/PLATELET
Abs Immature Granulocytes: 0.11 10*3/uL — ABNORMAL HIGH (ref 0.00–0.07)
Basophils Absolute: 0.1 10*3/uL (ref 0.0–0.1)
Basophils Relative: 1 %
Eosinophils Absolute: 0.4 10*3/uL (ref 0.0–0.5)
Eosinophils Relative: 2 %
HCT: 47 % — ABNORMAL HIGH (ref 36.0–46.0)
Hemoglobin: 14.7 g/dL (ref 12.0–15.0)
Immature Granulocytes: 1 %
Lymphocytes Relative: 18 %
Lymphs Abs: 3.3 10*3/uL (ref 0.7–4.0)
MCH: 27.6 pg (ref 26.0–34.0)
MCHC: 31.3 g/dL (ref 30.0–36.0)
MCV: 88.2 fL (ref 80.0–100.0)
MONO ABS: 1.1 10*3/uL — AB (ref 0.1–1.0)
Monocytes Relative: 6 %
Neutro Abs: 13.4 10*3/uL — ABNORMAL HIGH (ref 1.7–7.7)
Neutrophils Relative %: 72 %
Platelets: 292 10*3/uL (ref 150–400)
RBC: 5.33 MIL/uL — ABNORMAL HIGH (ref 3.87–5.11)
RDW: 13.2 % (ref 11.5–15.5)
WBC: 18.4 10*3/uL — ABNORMAL HIGH (ref 4.0–10.5)
nRBC: 0 % (ref 0.0–0.2)

## 2018-12-13 LAB — BASIC METABOLIC PANEL
Anion gap: 11 (ref 5–15)
BUN: 9 mg/dL (ref 6–20)
CO2: 20 mmol/L — ABNORMAL LOW (ref 22–32)
Calcium: 8.8 mg/dL — ABNORMAL LOW (ref 8.9–10.3)
Chloride: 104 mmol/L (ref 98–111)
Creatinine, Ser: 0.48 mg/dL (ref 0.44–1.00)
GFR calc Af Amer: >60 mL/min (ref >60)
GFR calc non Af Amer: >60 mL/min (ref >60)
Glucose, Bld: 211 mg/dL — ABNORMAL HIGH (ref 70–99)
Potassium: 3.7 mmol/L (ref 3.5–5.1)
Sodium: 135 mmol/L (ref 135–145)

## 2018-12-13 LAB — CULTURE, BLOOD (ROUTINE X 2)
Culture: NO GROWTH
Culture: NO GROWTH
Special Requests: ADEQUATE
Special Requests: ADEQUATE

## 2018-12-13 LAB — I-STAT BETA HCG BLOOD, ED (MC, WL, AP ONLY): I-stat hCG, quantitative: 22.4 m[IU]/mL — ABNORMAL HIGH (ref <5)

## 2018-12-13 MED ORDER — LIDOCAINE-EPINEPHRINE-TETRACAINE (LET) SOLUTION
3.0000 mL | Freq: Once | NASAL | Status: AC
  Administered 2018-12-13: 14:00:00 via TOPICAL

## 2018-12-13 MED ORDER — CLINDAMYCIN HCL 300 MG PO CAPS: 300.0000 mg | ORAL_CAPSULE | Freq: Three times a day (TID) | ORAL | 0 refills | Status: DC

## 2018-12-13 MED ORDER — HYDROCODONE-ACETAMINOPHEN 5-325 MG PO TABS: 1.0000 | ORAL_TABLET | Freq: Four times a day (QID) | ORAL | 0 refills | Status: DC | PRN

## 2018-12-13 MED ORDER — OXYCODONE-ACETAMINOPHEN 5-325 MG PO TABS
1.0000 | ORAL_TABLET | Freq: Once | ORAL | Status: AC
  Administered 2018-12-13: 1 via ORAL
  Filled 2018-12-13: qty 1

## 2018-12-13 MED ORDER — LIDOCAINE-EPINEPHRINE (PF) 1 %-1:200000 IJ SOLN
20.0000 mL | Freq: Once | INTRAMUSCULAR | Status: AC
  Administered 2018-12-13: 15:00:00
  Filled 2018-12-13: qty 30

## 2018-12-13 MED ORDER — LIDOCAINE-EPINEPHRINE-TETRACAINE (LET) SOLUTION
NASAL | Status: AC
  Filled 2018-12-13: qty 3

## 2018-12-13 MED ORDER — OXYCODONE-ACETAMINOPHEN 5-325 MG PO TABS
1.0000 | ORAL_TABLET | Freq: Once | ORAL | Status: AC
  Administered 2018-12-13: 1 via ORAL

## 2018-12-13 MED ORDER — CLINDAMYCIN HCL 150 MG PO CAPS
300.0000 mg | ORAL_CAPSULE | Freq: Once | ORAL | Status: AC
  Administered 2018-12-13: 300 mg via ORAL
  Filled 2018-12-13: qty 2

## 2018-12-13 NOTE — ED Provider Notes (Signed)
[facility] EMERGENCY DEPARTMENT Provider Note   CSN: [id number] Arrival date & time: 12/13/18  1033     History   Chief Complaint Chief Complaint  Patient presents with  . Abscess    HPI Janice Wright is a 33 y.o. female.  HPI Patient was evaluated 5 days ago for pelvic cellulitis.  She refused admission at that time.  Was given IV Zosyn and discharged home with clindamycin.  States that she has had persistent drainage from the site which she describes as bloody and purulent.  She has had low-grade fevers at home.  Endorses chills this morning.  States the pain has worsened today and she is been unable to sit down.  Has had nausea but no vomiting or diarrhea.  Denies any abdominal pain. Past Medical History:  Diagnosis Date  . Anxiety   . Chronic lower back pain   . Constipation due to opioid therapy   . Depression 2014   hx; "resolved"  . Fatty liver   . Hyperlipidemia   . Polycystic ovarian syndrome   . PONV (postoperative nausea and vomiting)   . Radiculopathy   . Type II diabetes mellitus (HCC) dx'd 2014    Patient Active Problem List   Diagnosis Date Noted  . Acute cystitis without hematuria   . Acute pyelonephritis   . Hypotension   . Sepsis (HCC) 09/26/2017  . Sepsis secondary to UTI (HCC) 09/26/2017  . Dehydration 01/20/2017  . Bronchitis, acute 01/20/2017  . Gastroenteritis 01/20/2017  . Tachycardia 01/20/2017  . Chronic pain syndrome 01/20/2017  . Radiculopathy 04/01/2015    Past Surgical History:  Procedure Laterality Date  . BACK SURGERY     x 2  . BACK SURGERY    . CESAREAN SECTION  2011  . CESAREAN SECTION    . CHOLECYSTECTOMY    . KNEE ARTHROSCOPY Left 2007  . LAPAROSCOPIC CHOLECYSTECTOMY  2006  . LUMBAR DISC SURGERY  08/2008; 12/2010  . POSTERIOR LUMBAR FUSION  10/2012  . POSTERIOR LUMBAR FUSION  04/01/2015   revision/notes 04/01/2015  . WISDOM TOOTH EXTRACTION     "2 @ a time"     OB History    Gravida  1   Para  0   Term  0   Preterm  0   AB  0   Living        SAB  0   TAB  0   Ectopic  0   Multiple      Live Births               Home Medications    Prior to Admission medications   Medication Sig Start Date End Date Taking? Authorizing Provider  amoxicillin-clavulanate (AUGMENTIN) 875-125 MG tablet Take 1 tablet by mouth every 12 (twelve) hours. Patient not taking: Reported on 12/08/2018 11/18/18   Eber Hong, MD  azithromycin (ZITHROMAX Z-PAK) 250 MG tablet Take 1 tablet (250 mg total) by mouth daily. 500mg  PO day 1, then 250mg  PO days 205 Patient not taking: Reported on 12/08/2018 11/18/18   Eber Hong, MD  benzonatate (TESSALON) 100 MG capsule Take 1 capsule (100 mg total) by mouth every 8 (eight) hours. Patient not taking: Reported on 12/08/2018 11/18/18   Eber Hong, MD  clindamycin (CLEOCIN) 300 MG capsule Take 1 capsule (300 mg total) by mouth 3 (three) times daily. 3 tabs PO TID x 10 days 12/13/18   Loren Racer, MD  HYDROcodone-acetaminophen [facility]) 5-325 MG tablet Take 1 tablet by  mouth every 6 (six) hours as needed for severe pain. 12/13/18   Loren RacerYelverton, Chrisette Man, MD  meloxicam (MOBIC) 7.5 MG tablet TAKE ONE TABLET (7.5 MG DOSE) BY MOUTH DAILY. 11/28/18   [provider]  metFORMIN (GLUCOPHAGE) 500 MG tablet Take 1 tablet (500 mg total) by mouth 2 (two) times daily with a meal. 08/28/18   Merlyn AlbertLuking, William S, MD  pregabalin (LYRICA) 100 MG capsule Take by mouth. 11/28/18 02/26/19  [provider]    Family History History reviewed. No pertinent family history.  Social History Social History   Tobacco Use  . Smoking status: Current Every Day Smoker    Packs/day: 0.50  . Smokeless tobacco: Never Used  Substance Use Topics  . Alcohol use: Not Currently  . Drug use: No     Allergies   Patient has no known allergies.   Review of Systems Review of Systems  Constitutional: Positive for chills and fever.  HENT: Negative for sore throat and trouble swallowing.     Respiratory: Negative for shortness of breath.   Cardiovascular: Negative for chest pain.  Gastrointestinal: Positive for nausea. Negative for abdominal pain, constipation, diarrhea and vomiting.  Musculoskeletal: Negative for back pain, myalgias and neck pain.  Skin: Negative for rash and wound.  Neurological: Negative for dizziness, weakness, light-headedness and numbness.  All other systems reviewed and are negative.    Physical Exam Updated Vital Signs BP 117/76   Pulse (!) 108   Temp 98.2 F (36.8 C) (Oral)   Resp 20   Ht 5\' 4"  (1.626 m)   Wt 104.3 kg   LMP 11/04/2018   SpO2 97%   BMI 39.48 kg/m   Physical Exam Vitals signs and nursing note reviewed.  Constitutional:      Appearance: Normal appearance. She is well-developed.  HENT:     Head: Normocephalic and atraumatic.     Mouth/Throat:     Mouth: Mucous membranes are moist.  Eyes:     Pupils: Pupils are equal, round, and reactive to light.  Neck:     Musculoskeletal: Normal range of motion and neck supple.  Cardiovascular:     Rate and Rhythm: Normal rate and regular rhythm.     Heart sounds: No murmur. No friction rub. No gallop.   Pulmonary:     Effort: Pulmonary effort is normal. No respiratory distress.     Breath sounds: Normal breath sounds. No stridor. No wheezing, rhonchi or rales.  Chest:     Chest wall: No tenderness.  Abdominal:     General: Bowel sounds are normal. There is no distension.     Palpations: Abdomen is soft.     Tenderness: There is no abdominal tenderness. There is no right CVA tenderness, left CVA tenderness, guarding or rebound.  Genitourinary:   Musculoskeletal: Normal range of motion.        General: No swelling, tenderness, deformity or signs of injury.     Right lower leg: No edema.     Left lower leg: No edema.  Skin:    General: Skin is warm and dry.     Capillary Refill: Capillary refill takes less than 2 seconds.     Findings: No erythema or rash.  Neurological:      General: No focal deficit present.     Mental Status: She is alert and oriented to person, place, and time.  Psychiatric:        Mood and Affect: Mood normal.  Behavior: Behavior normal.      ED Treatments / Results  Labs (all labs ordered are listed, but only abnormal results are displayed) Labs Reviewed  CBC WITH DIFFERENTIAL/PLATELET - Abnormal; Notable for the following components:      Result Value   WBC 18.4 (*)    RBC 5.33 (*)    HCT 47.0 (*)    Neutro Abs 13.4 (*)    Monocytes Absolute 1.1 (*)    Abs Immature Granulocytes 0.11 (*)    All other components within normal limits  BASIC METABOLIC PANEL - Abnormal; Notable for the following components:   CO2 20 (*)    Glucose, Bld 211 (*)    Calcium 8.8 (*)    All other components within normal limits  I-STAT BETA HCG BLOOD, ED ([facility], [facility], [facility] ONLY) - Abnormal; Notable for the following components:   I-stat hCG, quantitative 22.4 (*)    All other components within normal limits  AEROBIC CULTURE (SUPERFICIAL SPECIMEN)    EKG None  Radiology No results found.  Procedures .Marland KitchenIncision and Drainage Date/Time: 12/13/2018 3:12 PM Performed by: Loren Racer, MD Authorized by: Loren Racer, MD   Consent:    Consent obtained:  Verbal   Consent given by:  Patient   Risks discussed:  Bleeding, incomplete drainage and infection   Alternatives discussed:  No treatment Location:    Type:  Abscess   Size:  3   Location:  Anogenital   Anogenital location:  Perianal Anesthesia (see MAR for exact dosages):    Anesthesia method:  Topical application and local infiltration   Topical anesthetic:  LET   Local anesthetic:  Lidocaine 1% WITH epi Procedure type:    Complexity:  Complex Procedure details:    Incision types:  Single straight   Incision depth:  Dermal   Scalpel blade:  11   Wound management:  Probed and deloculated   Drainage:  Purulent   Drainage amount:  Moderate   Wound treatment:  Wound left  open   Packing materials:  1/2 in iodoform gauze Post-procedure details:    Patient tolerance of procedure:  Tolerated with difficulty   (including critical care time)  Medications Ordered in ED Medications  oxyCODONE-acetaminophen (PERCOCET/ROXICET) 5-325 MG per tablet 1 tablet (1 tablet Oral Given 12/13/18 1249)  oxyCODONE-acetaminophen (PERCOCET/ROXICET) 5-325 MG per tablet 1 tablet (1 tablet Oral Given 12/13/18 1332)  oxyCODONE-acetaminophen (PERCOCET/ROXICET) 5-325 MG per tablet 1 tablet (1 tablet Oral Given 12/13/18 1330)  lidocaine-EPINEPHrine-tetracaine (LET) solution ( Topical Given 12/13/18 1332)  lidocaine-EPINEPHrine (XYLOCAINE-EPINEPHrine) 1 %-1:200000 (PF) injection 20 mL ( Infiltration Given 12/13/18 1515)  clindamycin (CLEOCIN) capsule 300 mg (300 mg Oral Given 12/13/18 1535)     Initial Impression / Assessment and Plan / ED Course  I have reviewed the triage vital signs and the nursing notes.  Pertinent labs & imaging results that were available during my care of the patient were reviewed by me and considered in my medical decision making (see chart for details).     Incision and drainage performed.  Culture was sent.  Patient has no overlying cellulitis.  Will continue on clindamycin and have patient return in 2 days for repeat wound check and packing removal.  Understands need to return immediately for any worsening pain, swelling or concerns.  Final Clinical Impressions(s) / ED Diagnoses   Final diagnoses:  Perianal abscess    ED Discharge Orders         Ordered    clindamycin (CLEOCIN) 300 MG  capsule  3 times daily     12/13/18 1551    HYDROcodone-acetaminophen (NORCO) 5-325 MG tablet  Every 6 hours PRN     12/13/18 1551           Loren RacerYelverton, Kendryck Lacroix, MD 12/13/18 1552

## 2018-12-13 NOTE — ED Triage Notes (Signed)
Patient with abscess to her perineum, seen here 2/8. Was seen by PCP today and sent to ED, abscess area worsening, patient running fever at home.

## 2018-12-14 ENCOUNTER — Telehealth: Payer: Self-pay | Admitting: *Deleted

## 2018-12-14 NOTE — Telephone Encounter (Signed)
Pharmacy called related to Rx: Cleocin instructions...EDCM clarified with EDP ([place]) to change Rx to: take 1 capsule by mouth 3 times daily.

## 2018-12-16 LAB — AEROBIC CULTURE W GRAM STAIN (SUPERFICIAL SPECIMEN): Gram Stain: NONE SEEN

## 2018-12-16 LAB — AEROBIC CULTURE  (SUPERFICIAL SPECIMEN)

## 2018-12-17 ENCOUNTER — Telehealth: Payer: Self-pay | Admitting: *Deleted

## 2018-12-17 NOTE — Progress Notes (Signed)
ED Antimicrobial Stewardship Positive Culture Follow Up   Janice Wright is an 33 y.o. female who presented to [facility] on 12/13/2018 with a chief complaint of  Chief Complaint  Patient presents with  . Abscess    Recent Results (from the past 720 hour(s))  Urine culture     Status: None   Collection Time: 12/08/18  8:59 AM  Result Value Ref Range Status   Specimen Description   Final    URINE, CLEAN CATCH Performed at [facility], [address] [place], [place] [address]    Special Requests   Final    NONE Performed at [facility], [address] [place], [place] [address]    Culture   Final    NO GROWTH Performed at [facility], [address] [place], [place] [address]    Report Status 12/10/2018 FINAL  Final  Culture, blood (routine x 2)     Status: None   Collection Time: 12/08/18  9:29 AM  Result Value Ref Range Status   Specimen Description   Final    RIGHT ANTECUBITAL BOTTLES DRAWN AEROBIC AND ANAEROBIC   Special Requests Blood Culture adequate volume  Final   Culture   Final    NO GROWTH 5 DAYS Performed at [facility], [address] [place], [place] [address]    Report Status 12/13/2018 FINAL  Final  Culture, blood (routine x 2)     Status: None   Collection Time: 12/08/18  9:36 AM  Result Value Ref Range Status   Specimen Description   Final    LEFT ANTECUBITAL BOTTLES DRAWN AEROBIC AND ANAEROBIC   Special Requests Blood Culture adequate volume  Final   Culture   Final    NO GROWTH 5 DAYS Performed at [facility], [address], [place], [place] [address]    Report Status 12/13/2018 FINAL  Final  Wound or Superficial Culture     Status: None   Collection Time: 12/13/18  3:30 PM  Result Value Ref Range Status   Specimen Description   Final    ABSCESS Performed at [facility], [address], [place], [place] [address]    Special Requests   Final    VAGINAL AREA Performed at [facility], [address] [place], [place] [address]    Gram Stain NO WBC SEEN FEW GRAM POSITIVE COCCI   Final   Culture   Final    MODERATE GROUP B STREP(S.AGALACTIAE)ISOLATED TESTING AGAINST S. AGALACTIAE NOT ROUTINELY PERFORMED DUE TO PREDICTABILITY OF AMP/PEN/VAN SUSCEPTIBILITY. Performed at [facility], [address] [place], [place] [address]    Report Status 12/16/2018 FINAL  Final    Plan: Call patient and reminder her to come back in for wound checkup Stop clindamycin and start amoxicillin  New antibiotic prescription: amoxicillin 500mg  PO TID x 5 days  ED Provider: Ruffin Pyo 12/17/2018, 10:44 AM Clinical Pharmacist Monday - Friday phone -  [phone] Saturday - Sunday phone - [phone]

## 2018-12-17 NOTE — Telephone Encounter (Signed)
Post ED Visit - Positive Culture Follow-up: Successful Patient Follow-Up  Culture assessed and recommendations reviewed by:  [x]  Enzo Bi, Pharm.D. []  Celedonio Miyamoto, Pharm.D., BCPS AQ-ID []  Garvin Fila, Pharm.D., BCPS []  Georgina Pillion, Pharm.D., BCPS []  [place], [address].D., BCPS, AAHIVP []  Estella Husk, Pharm.D., BCPS, AAHIVP []  Lysle Pearl, PharmD, BCPS []  Phillips Climes, PharmD, BCPS []  Agapito Games, PharmD, BCPS []  Verlan Friends, PharmD  Positive wound culture  []  Patient discharged without antimicrobial prescription and treatment is now indicated [x]  Organism is resistant to prescribed ED discharge antimicrobial []  Patient with positive blood cultures  Changes discussed with ED provider: Burna Forts, PA-C New antibiotic prescription Amoxicillin 500mg  PO TID x 5 days Called to [facility], [place], [phone]  Contacted patient, date 12/17/2018, time [id number]   Lysle Pearl 12/17/2018, 8:33 AM

## 2019-02-14 ENCOUNTER — Other Ambulatory Visit: Payer: Self-pay

## 2019-02-14 ENCOUNTER — Emergency Department (HOSPITAL_COMMUNITY)
Admission: EM | Admit: 2019-02-14 | Discharge: 2019-02-14 | Disposition: A | Discharge status: Home / Self Care | Payer: Medicare Other | Attending: Emergency Medicine | Admitting: Emergency Medicine

## 2019-02-14 ENCOUNTER — Encounter (HOSPITAL_COMMUNITY): Payer: Self-pay | Admitting: Emergency Medicine

## 2019-02-14 DIAGNOSIS — R739 Hyperglycemia, unspecified: Secondary | ICD-10-CM

## 2019-02-14 DIAGNOSIS — Z7984 Long term (current) use of oral hypoglycemic drugs: Secondary | ICD-10-CM | POA: Insufficient documentation

## 2019-02-14 DIAGNOSIS — Z79899 Other long term (current) drug therapy: Secondary | ICD-10-CM | POA: Diagnosis not present

## 2019-02-14 DIAGNOSIS — E1165 Type 2 diabetes mellitus with hyperglycemia: Secondary | ICD-10-CM | POA: Diagnosis not present

## 2019-02-14 DIAGNOSIS — F17210 Nicotine dependence, cigarettes, uncomplicated: Secondary | ICD-10-CM | POA: Insufficient documentation

## 2019-02-14 DIAGNOSIS — L02214 Cutaneous abscess of groin: Secondary | ICD-10-CM | POA: Insufficient documentation

## 2019-02-14 LAB — CBG MONITORING, ED: Glucose-Capillary: 298 mg/dL — ABNORMAL HIGH (ref 70–99)

## 2019-02-14 MED ORDER — LIDOCAINE-EPINEPHRINE (PF) 2 %-1:200000 IJ SOLN
10.0000 mL | Freq: Once | INTRAMUSCULAR | Status: AC
  Administered 2019-02-14: 10 mL via INTRADERMAL
  Filled 2019-02-14: qty 10

## 2019-02-14 MED ORDER — POVIDONE-IODINE 10 % EX SOLN
CUTANEOUS | Status: DC | PRN
  Administered 2019-02-14: 13:00:00 via TOPICAL
  Filled 2019-02-14: qty 15

## 2019-02-14 MED ORDER — SODIUM CHLORIDE 0.9 % IV BOLUS: 1000.0000 mL | Freq: Once | INTRAVENOUS | Status: DC

## 2019-02-14 MED ORDER — HYDROCODONE-ACETAMINOPHEN 5-325 MG PO TABS
1.0000 | ORAL_TABLET | Freq: Once | ORAL | Status: AC
  Administered 2019-02-14: 1 via ORAL
  Filled 2019-02-14: qty 1

## 2019-02-14 NOTE — ED Triage Notes (Signed)
Patient complains of an abscess x 2 weeks and she has been on antibiotics for about a month. It is located right inner thigh. Denies N/V/D and fevers.

## 2019-02-14 NOTE — ED Provider Notes (Signed)
[facility] EMERGENCY DEPARTMENT Provider Note   CSN: [id number] Arrival date & time: 02/14/19  1104    History   Chief Complaint Chief Complaint  Patient presents with  . Abscess    HPI Janice Wright is a 33 y.o. female.     HPI   Janice Severinshley S Roddy is a 33 y.o. female with PMH of DM, frequent skin abscesses, tachycardia, who presents to the Emergency Department complaining a localized area of pain and swelling to her right inner groin.  Symptoms have been present for 2 weeks.  She reports recurrent abscesses to same area for several months.  She initially had some improvement after taking Clindamycin and doxycyline, but states that her current "boil" became larger in size and increasingly more painful.  She endorses a small amount of occasional yellow drainage.  She is currently taking doxycycline.  She denies fever, chills, abdominal or vaginal pain, dysuria and vomiting.    Past Medical History:  Diagnosis Date  . Anxiety   . Chronic lower back pain   . Constipation due to opioid therapy   . Depression 2014   hx; "resolved"  . Fatty liver   . Hyperlipidemia   . Polycystic ovarian syndrome   . PONV (postoperative nausea and vomiting)   . Radiculopathy   . Type II diabetes mellitus (HCC) dx'd 2014    Patient Active Problem List   Diagnosis Date Noted  . Acute cystitis without hematuria   . Acute pyelonephritis   . Hypotension   . Sepsis (HCC) 09/26/2017  . Sepsis secondary to UTI (HCC) 09/26/2017  . Dehydration 01/20/2017  . Bronchitis, acute 01/20/2017  . Gastroenteritis 01/20/2017  . Tachycardia 01/20/2017  . Chronic pain syndrome 01/20/2017  . Radiculopathy 04/01/2015    Past Surgical History:  Procedure Laterality Date  . BACK SURGERY     x 2  . BACK SURGERY    . CESAREAN SECTION  2011  . CESAREAN SECTION    . CHOLECYSTECTOMY    . KNEE ARTHROSCOPY Left 2007  . LAPAROSCOPIC CHOLECYSTECTOMY  2006  . LUMBAR DISC SURGERY  08/2008; 12/2010  .  POSTERIOR LUMBAR FUSION  10/2012  . POSTERIOR LUMBAR FUSION  04/01/2015   revision/notes 04/01/2015  . WISDOM TOOTH EXTRACTION     "2 @ a time"     OB History    Gravida  1   Para  0   Term  0   Preterm  0   AB  0   Living        SAB  0   TAB  0   Ectopic  0   Multiple      Live Births               Home Medications    Prior to Admission medications   Medication Sig Start Date End Date Taking? Authorizing Provider  amoxicillin-clavulanate (AUGMENTIN) 875-125 MG tablet Take 1 tablet by mouth every 12 (twelve) hours. Patient not taking: Reported on 12/08/2018 11/18/18   Eber HongMiller, Brian, MD  azithromycin (ZITHROMAX Z-PAK) 250 MG tablet Take 1 tablet (250 mg total) by mouth daily. 500mg  PO day 1, then 250mg  PO days 205 Patient not taking: Reported on 12/08/2018 11/18/18   Eber HongMiller, Brian, MD  benzonatate (TESSALON) 100 MG capsule Take 1 capsule (100 mg total) by mouth every 8 (eight) hours. Patient not taking: Reported on 12/08/2018 11/18/18   Eber HongMiller, Brian, MD  clindamycin (CLEOCIN) 300 MG capsule Take 1 capsule (300 mg total)  by mouth 3 (three) times daily. 3 tabs PO TID x 10 days 12/13/18   Loren Racer, MD  HYDROcodone-acetaminophen [facility]) 5-325 MG tablet Take 1 tablet by mouth every 6 (six) hours as needed for severe pain. 12/13/18   Loren Racer, MD  meloxicam (MOBIC) 7.5 MG tablet TAKE ONE TABLET (7.5 MG DOSE) BY MOUTH DAILY. 11/28/18   [provider]  metFORMIN (GLUCOPHAGE) 500 MG tablet Take 1 tablet (500 mg total) by mouth 2 (two) times daily with a meal. 08/28/18   Merlyn Albert, MD  pregabalin (LYRICA) 100 MG capsule Take by mouth. 11/28/18 02/26/19  [provider]    Family History No family history on file.  Social History Social History   Tobacco Use  . Smoking status: Current Every Day Smoker    Packs/day: 0.50  . Smokeless tobacco: Never Used  Substance Use Topics  . Alcohol use: Not Currently  . Drug use: No     Allergies    Patient has no known allergies.   Review of Systems Review of Systems  Constitutional: Negative for chills and fever.  Respiratory: Negative for shortness of breath.   Gastrointestinal: Negative for nausea and vomiting.  Endocrine: Negative for polydipsia, polyphagia and polyuria.  Genitourinary: Negative for dysuria.  Musculoskeletal: Negative for arthralgias.  Skin: Negative for color change and rash.       Focal pain, swelling of the right inner groin  Neurological: Negative for dizziness, weakness and numbness.  Hematological: Negative for adenopathy.  Psychiatric/Behavioral: Negative for confusion.     Physical Exam Updated Vital Signs BP 130/81 (BP Location: Right Arm)   Pulse (!) 118   Temp (!) 97.5 F (36.4 C) (Oral)   Resp 16   Ht  (1.651 m)   Wt 104.3 kg   LMP 12/03/2018 (Approximate) Comment: tubes removed August 2019  SpO2 100%   BMI 38.27 kg/m   Physical Exam Vitals signs and nursing note reviewed.  Constitutional:      General: She is not in acute distress.    Appearance: She is well-developed. She is not ill-appearing or toxic-appearing.     Comments: Pt is anxious appearing and tearful  HENT:     Head: Normocephalic.     Mouth/Throat:     Mouth: Mucous membranes are moist.  Cardiovascular:     Rate and Rhythm: Regular rhythm. Tachycardia present.     Pulses: Normal pulses.     Heart sounds: No murmur.  Pulmonary:     Effort: Pulmonary effort is normal. No respiratory distress.     Breath sounds: Normal breath sounds.  Abdominal:     General: There is no distension.     Palpations: Abdomen is soft.     Tenderness: There is no abdominal tenderness.  Genitourinary:    Comments: Vagina is non-tender, no fluctuance, induration or erythema of the vulva or labia Musculoskeletal: Normal range of motion.  Skin:    General: Skin is warm.     Findings: No erythema or rash.     Comments: Focal area of fluctuance of the right inner groin.  No  surrounding erythema.  No drainage.    Neurological:     General: No focal deficit present.     Mental Status: She is alert. Mental status is at baseline.     Motor: No abnormal muscle tone.     Coordination: Coordination normal.      ED Treatments / Results  Labs (all labs ordered are listed, but  only abnormal results are displayed) Labs Reviewed  CBG MONITORING, ED - Abnormal; Notable for the following components:      Result Value   Glucose-Capillary 298 (*)    All other components within normal limits  BASIC METABOLIC PANEL  POC URINE PREG, ED    EKG None  Radiology No results found.  Procedures Procedures (including critical care time)  INCISION AND DRAINAGE Performed by: Dewie Ahart Consent: Verbal consent obtained. Risks and benefits: risks, benefits and alternatives were discussed Type: abscess  Body area: right groin  Anesthesia: local infiltration  Incision was made with a #11 scalpel.  Local anesthetic: lidocaine 2% w/ epinephrine  Anesthetic total: 3 ml  Complexity: complex Blunt dissection to break up loculations  Drainage: purulent  Drainage amount: large  Packing material: 1/4 in iodoform gauze  Patient tolerance: Patient tolerated the procedure well with no immediate complications.     Medications Ordered in ED Medications  povidone-iodine (BETADINE) 10 % external solution ( Topical Given by Other 02/14/19 1245)  HYDROcodone-acetaminophen (NORCO/VICODIN) 5-325 MG per tablet 1 tablet (1 tablet Oral Given 02/14/19 1223)  lidocaine-EPINEPHrine (XYLOCAINE W/EPI) 2 %-1:200000 (PF) injection 10 mL (10 mLs Intradermal Given by Other 02/14/19 1245)     Initial Impression / Assessment and Plan / ED Course  I have reviewed the triage vital signs and the nursing notes.  Pertinent labs & imaging results that were available during my care of the patient were reviewed by me and considered in my medical decision making (see chart for details).         1345  Pt remains tachycardic and blood glucose is 298.  She is well appearing and non-toxic.  I have recommended IV fluid bolus and BMP.  She is requesting d/c home stating that her husband is waiting on her and that her baseline heart rate is usually greater than 100.  She denies CP, dyspnea, palpitations and dizziness.  She also admits to drinking a caffeine energy drink this morning prior to arrival.  Since she is refusing to stay for further eval, I will d/c her with the understanding that she will d/c the energy drinks and monitor her blood sugar closely and take her medication as directed.  Clinically, I doubt DKA, Strict return precautions also discussed.    She is currently taking doxycycline and will continue as directed by her PCP.  Packing removal in 2 days and warm water soaks.    Final Clinical Impressions(s) / ED Diagnoses   Final diagnoses:  Abscess, groin  Hyperglycemia    ED Discharge Orders    None       Pauline Aus, PA-C 02/14/19 1450    Blane Ohara, MD 02/17/19 [phone]

## 2019-02-14 NOTE — Discharge Instructions (Addendum)
As discussed, stop the energy drinks, increase water intake and check your blood sugar closely.  Continue your antibiotic as directed.  Packing will need to be removed in 2 days.  Return here for any worsening symptoms

## 2019-04-29 ENCOUNTER — Other Ambulatory Visit: Payer: Self-pay | Admitting: Family Medicine

## 2019-04-30 NOTE — Telephone Encounter (Signed)
Pt has virtual visit scheduled

## 2019-04-30 NOTE — Telephone Encounter (Signed)
Please call pt and schedule appt. Route to nurses once scheduled thank you

## 2019-05-08 ENCOUNTER — Other Ambulatory Visit: Payer: Self-pay

## 2019-05-10 ENCOUNTER — Other Ambulatory Visit: Payer: Self-pay

## 2019-05-10 ENCOUNTER — Ambulatory Visit (INDEPENDENT_AMBULATORY_CARE_PROVIDER_SITE_OTHER): Payer: Medicare Other | Admitting: Family Medicine

## 2019-05-10 ENCOUNTER — Encounter: Payer: Self-pay | Admitting: Family Medicine

## 2019-05-10 DIAGNOSIS — Z794 Long term (current) use of insulin: Secondary | ICD-10-CM | POA: Diagnosis not present

## 2019-05-10 DIAGNOSIS — E119 Type 2 diabetes mellitus without complications: Secondary | ICD-10-CM | POA: Diagnosis not present

## 2019-05-10 MED ORDER — METFORMIN HCL 500 MG PO TABS: 1000.0000 mg | ORAL_TABLET | Freq: Two times a day (BID) | ORAL | 1 refills | Status: DC

## 2019-05-10 MED ORDER — GLIPIZIDE 5 MG PO TABS: 10.0000 mg | ORAL_TABLET | Freq: Two times a day (BID) | ORAL | 1 refills | Status: DC

## 2019-05-10 NOTE — Progress Notes (Signed)
   Subjective:  Audio only  Patient ID: Janice Wright, female    DOB: 12/08/1985, 33 y.o.   MRN: [id number]  Diabetes She presents for her follow-up diabetic visit. She has type 2 diabetes mellitus. Risk factors for coronary artery disease include diabetes mellitus. Current diabetic treatment includes oral agent (dual therapy). She is compliant with treatment all of the time. Her weight is stable. She is following a diabetic diet.   Patient states her sugars have been running better since [facility] added glipizide 5mg  BID per patient  Virtual Visit via Video Note  I connected with Janice Wright on 05/10/19 at  3:30 PM EDT by a video enabled telemedicine application and verified that I am speaking with the correct person using two identifiers.  Location: Patient: home Provider: office   I discussed the limitations of evaluation and management by telemedicine and the availability of in person appointments. The patient expressed understanding and agreed to proceed.  History of Present Illness:    Observations/Objective:   Assessment and Plan:   Follow Up Instructions:    I discussed the assessment and treatment plan with the patient. The patient was provided an opportunity to ask questions and all were answered. The patient agreed with the plan and demonstrated an understanding of the instructions.   The patient was advised to call back or seek an in-person evaluation if the symptoms worsen or if the condition fails to improve as anticipated.  I provided 16 minutes of non-face-to-face time during this encounter.  High 100s and low     Review of Systems No headache, no major weight loss or weight gain, no chest pain no back pain abdominal pain no change in bowel habits complete ROS otherwise negative     Objective:   Physical Exam  Virtual      Assessment & Plan:  Impression type 2 diabetes.  Control suboptimal.  Patient has raise her  metformin on her own from 1 twice daily to 2 twice daily.  Also went to another primary care provider and was given glipizide which she takes 5 mg twice daily.  Most fasting sugars still 1 50-1 80.  Increase Glucotrol to 2 twice daily.  Follow-up in 4 months diet exercise discussed warning signs discussed.

## 2019-11-05 ENCOUNTER — Other Ambulatory Visit: Payer: Self-pay

## 2019-11-05 ENCOUNTER — Ambulatory Visit (INDEPENDENT_AMBULATORY_CARE_PROVIDER_SITE_OTHER): Payer: Medicare Other | Admitting: Family Medicine

## 2019-11-05 DIAGNOSIS — E119 Type 2 diabetes mellitus without complications: Secondary | ICD-10-CM | POA: Diagnosis not present

## 2019-11-05 DIAGNOSIS — Z794 Long term (current) use of insulin: Secondary | ICD-10-CM | POA: Diagnosis not present

## 2019-11-05 MED ORDER — DOXYCYCLINE HYCLATE 100 MG PO TABS: 100.0000 mg | ORAL_TABLET | Freq: Two times a day (BID) | ORAL | 0 refills | Status: DC

## 2019-11-05 MED ORDER — FARXIGA 10 MG PO TABS: 10.0000 mg | ORAL_TABLET | Freq: Every day | ORAL | 5 refills | Status: AC

## 2019-11-05 NOTE — Progress Notes (Signed)
   Subjective:    Patient ID: Janice Wright, female    DOB: 12-Apr-1986, 34 y.o.   MRN: [id number]  Diabetes She presents for her follow-up diabetic visit. She has type 2 diabetes mellitus. Current diabetic treatments: metformin, glipizide. She is compliant with treatment all of the time. Exercise: walks for exercise. Home blood sugar record trend: fasting sugars in the high 200's. Eye exam is not current (last eye exam was about 5 years).   Pt states she has two boils to come up. States she has this happen when her sugar is high. Both boils are in pelvic area. Came up about 3 days ago. No fever.   Virtual Visit via Telephone Note  I connected with Janice Wright on 11/05/19 at  1:10 PM EST by telephone and verified that I am speaking with the correct person using two identifiers.  Location: Patient: home Provider: office   I discussed the limitations, risks, security and privacy concerns of performing an evaluation and management service by telephone and the availability of in person appointments. I also discussed with the patient that there may be a patient responsible charge related to this service. The patient expressed understanding and agreed to proceed.   History of Present Illness:    Observations/Objective:   Assessment and Plan:   Follow Up Instructions:    I discussed the assessment and treatment plan with the patient. The patient was provided an opportunity to ask questions and all were answered. The patient agreed with the plan and demonstrated an understanding of the instructions.   The patient was advised to call back or seek an in-person evaluation if the symptoms worsen or if the condition fails to improve as anticipated.  20 minutes of non-face-to-face time during this encounter.     Review of Systems No headache, no major weight loss or weight gain, no chest pain no back pain abdominal pain no change in bowel habits complete ROS otherwise negative    Objective:   Physical Exam  Virtual      Assessment & Plan:  Impression type 2 diabetes.  Suboptimal control discussed.  We will add [place].  Rationale discussed.  Call us in a month with numbers.  2.  Boils.  Has occurred in the past.  Antibiotics prescribed local measures discussed  Diet exercise discussed medications refilled follow-up in 6 months but do call us in a month regarding sugar numbers

## 2019-11-27 ENCOUNTER — Ambulatory Visit (INDEPENDENT_AMBULATORY_CARE_PROVIDER_SITE_OTHER): Payer: Medicare Other | Admitting: Family Medicine

## 2019-11-27 ENCOUNTER — Encounter: Payer: Self-pay | Admitting: Family Medicine

## 2019-11-27 ENCOUNTER — Other Ambulatory Visit: Payer: Self-pay

## 2019-11-27 DIAGNOSIS — L0292 Furuncle, unspecified: Secondary | ICD-10-CM | POA: Diagnosis not present

## 2019-11-27 MED ORDER — SULFAMETHOXAZOLE-TRIMETHOPRIM 800-160 MG PO TABS: 1.0000 | ORAL_TABLET | Freq: Two times a day (BID) | ORAL | 0 refills | Status: DC

## 2019-11-27 NOTE — Progress Notes (Signed)
   Subjective:  Audio video  Patient ID: Janice Wright, female    DOB: 22-Feb-1986, 34 y.o.   MRN: [id number]  HPI boil on each thigh. One came up a week ago and the other one 2 days ago. One on right leg is about the size of a quarter and the left thigh is about the size of a dime. Not painful. Pt states she took doxy about 3 weeks ago and it got better but never completely went away. No fever.   Virtual Visit via Telephone Note  I connected with Janice Wright on 11/27/19 at  9:30 AM EST by telephone and verified that I am speaking with the correct person using two identifiers.  Location: Patient: home  Provider: office   I discussed the limitations, risks, security and privacy concerns of performing an evaluation and management service by telephone and the availability of in person appointments. I also discussed with the patient that there may be a patient responsible charge related to this service. The patient expressed understanding and agreed to proceed.   History of Present Illness:    Observations/Objective:   Assessment and Plan:   Follow Up Instructions:    I discussed the assessment and treatment plan with the patient. The patient was provided an opportunity to ask questions and all were answered. The patient agreed with the plan and demonstrated an understanding of the instructions.   The patient was advised to call back or seek an in-person evaluation if the symptoms worsen or if the condition fails to improve as anticipated.  I provided 22 minutes of non-face-to-face time during this encounter.  Patient reports glucose is better though not ideal on the new medication.  Down to 1 50-1 70 most mornings.  Last boil improved with Doxy but did not completely go away  Now has developed a new 1 along with resurgence of deal.  Not pointing.  No fever no chills   Review of Systems See above    Objective:   Physical Exam  Virtual      Assessment & Plan:    Pression recurrent boils.  Local measures discussed.  Bactrim DS twice daily for 14 days.  Long-term want to fasting sugars to get down hopefully to 130.  Discussed.  Questions answered

## 2019-11-29 ENCOUNTER — Other Ambulatory Visit: Payer: Self-pay | Admitting: Family Medicine

## 2019-12-02 ENCOUNTER — Encounter: Payer: Self-pay | Admitting: Family Medicine

## 2020-09-11 ENCOUNTER — Encounter (HOSPITAL_COMMUNITY): Payer: Self-pay | Admitting: *Deleted

## 2020-09-11 ENCOUNTER — Emergency Department (HOSPITAL_COMMUNITY)
Admission: EM | Admit: 2020-09-11 | Discharge: 2020-09-12 | Discharge status: Against Medical Advice | Payer: Medicare Other | Attending: Emergency Medicine | Admitting: Emergency Medicine

## 2020-09-11 ENCOUNTER — Other Ambulatory Visit: Payer: Self-pay

## 2020-09-11 ENCOUNTER — Emergency Department (HOSPITAL_COMMUNITY): Payer: Medicare Other

## 2020-09-11 DIAGNOSIS — R110 Nausea: Secondary | ICD-10-CM | POA: Insufficient documentation

## 2020-09-11 DIAGNOSIS — Z87440 Personal history of urinary (tract) infections: Secondary | ICD-10-CM | POA: Diagnosis not present

## 2020-09-11 DIAGNOSIS — F17200 Nicotine dependence, unspecified, uncomplicated: Secondary | ICD-10-CM | POA: Diagnosis not present

## 2020-09-11 DIAGNOSIS — A419 Sepsis, unspecified organism: Secondary | ICD-10-CM | POA: Diagnosis not present

## 2020-09-11 DIAGNOSIS — M7918 Myalgia, other site: Secondary | ICD-10-CM | POA: Diagnosis present

## 2020-09-11 DIAGNOSIS — Z20822 Contact with and (suspected) exposure to covid-19: Secondary | ICD-10-CM | POA: Diagnosis not present

## 2020-09-11 DIAGNOSIS — E119 Type 2 diabetes mellitus without complications: Secondary | ICD-10-CM | POA: Insufficient documentation

## 2020-09-11 DIAGNOSIS — Z7984 Long term (current) use of oral hypoglycemic drugs: Secondary | ICD-10-CM | POA: Insufficient documentation

## 2020-09-11 DIAGNOSIS — R509 Fever, unspecified: Secondary | ICD-10-CM | POA: Diagnosis not present

## 2020-09-11 DIAGNOSIS — F419 Anxiety disorder, unspecified: Secondary | ICD-10-CM | POA: Diagnosis not present

## 2020-09-11 LAB — CBC WITH DIFFERENTIAL/PLATELET
Abs Immature Granulocytes: 0.1 10*3/uL — ABNORMAL HIGH (ref 0.00–0.07)
Basophils Absolute: 0.1 10*3/uL (ref 0.0–0.1)
Basophils Relative: 0 %
Eosinophils Absolute: 0.1 10*3/uL (ref 0.0–0.5)
Eosinophils Relative: 1 %
HCT: 44.8 % (ref 36.0–46.0)
Hemoglobin: 14.7 g/dL (ref 12.0–15.0)
Immature Granulocytes: 1 %
Lymphocytes Relative: 9 %
Lymphs Abs: 1.7 10*3/uL (ref 0.7–4.0)
MCH: 29.9 pg (ref 26.0–34.0)
MCHC: 32.8 g/dL (ref 30.0–36.0)
MCV: 91.2 fL (ref 80.0–100.0)
Monocytes Absolute: 1.3 10*3/uL — ABNORMAL HIGH (ref 0.1–1.0)
Monocytes Relative: 7 %
Neutro Abs: 15.4 10*3/uL — ABNORMAL HIGH (ref 1.7–7.7)
Neutrophils Relative %: 82 %
Platelets: 353 10*3/uL (ref 150–400)
RBC: 4.91 MIL/uL (ref 3.87–5.11)
RDW: 12.9 % (ref 11.5–15.5)
WBC: 18.6 10*3/uL — ABNORMAL HIGH (ref 4.0–10.5)
nRBC: 0 % (ref 0.0–0.2)

## 2020-09-11 LAB — COMPREHENSIVE METABOLIC PANEL
ALT: 16 U/L (ref 0–44)
AST: 19 U/L (ref 15–41)
Albumin: 3.8 g/dL (ref 3.5–5.0)
Alkaline Phosphatase: 69 U/L (ref 38–126)
Anion gap: 8 (ref 5–15)
BUN: 8 mg/dL (ref 6–20)
CO2: 23 mmol/L (ref 22–32)
Calcium: 9.1 mg/dL (ref 8.9–10.3)
Chloride: 104 mmol/L (ref 98–111)
Creatinine, Ser: 0.69 mg/dL (ref 0.44–1.00)
GFR, Estimated: >60 mL/min (ref >60)
Glucose, Bld: 168 mg/dL — ABNORMAL HIGH (ref 70–99)
Potassium: 3.8 mmol/L (ref 3.5–5.1)
Sodium: 135 mmol/L (ref 135–145)
Total Bilirubin: 0.5 mg/dL (ref 0.3–1.2)
Total Protein: 7.2 g/dL (ref 6.5–8.1)

## 2020-09-11 LAB — RESPIRATORY PANEL BY RT PCR (FLU A&B, COVID)
Influenza A by PCR: NEGATIVE
Influenza B by PCR: NEGATIVE
SARS Coronavirus 2 by RT PCR: NEGATIVE

## 2020-09-11 LAB — URINALYSIS, ROUTINE W REFLEX MICROSCOPIC
Bilirubin Urine: NEGATIVE
Glucose, UA: 50 mg/dL — AB
Hgb urine dipstick: NEGATIVE
Ketones, ur: NEGATIVE mg/dL
Leukocytes,Ua: NEGATIVE
Nitrite: NEGATIVE
Protein, ur: NEGATIVE mg/dL
Specific Gravity, Urine: 1.008 (ref 1.005–1.030)
pH: 8 (ref 5.0–8.0)

## 2020-09-11 LAB — PROTIME-INR
INR: 0.9 (ref 0.8–1.2)
Prothrombin Time: 12 seconds (ref 11.4–15.2)

## 2020-09-11 LAB — APTT: aPTT: 28 seconds (ref 24–36)

## 2020-09-11 LAB — POC URINE PREG, ED: Preg Test, Ur: NEGATIVE

## 2020-09-11 LAB — LACTIC ACID, PLASMA
Lactic Acid, Venous: 2 mmol/L (ref 0.5–1.9)
Lactic Acid, Venous: 2 mmol/L (ref 0.5–1.9)

## 2020-09-11 MED ORDER — METRONIDAZOLE 500 MG PO TABS
500.0000 mg | ORAL_TABLET | Freq: Three times a day (TID) | ORAL | Status: DC
  Administered 2020-09-11: 500 mg via ORAL
  Filled 2020-09-11: qty 1

## 2020-09-11 MED ORDER — SODIUM CHLORIDE 0.9 % IV BOLUS (SEPSIS)
800.0000 mL | Freq: Once | INTRAVENOUS | Status: AC
  Administered 2020-09-11: 800 mL via INTRAVENOUS

## 2020-09-11 MED ORDER — VANCOMYCIN HCL IN DEXTROSE 1-5 GM/200ML-% IV SOLN: 1000.0000 mg | Freq: Once | INTRAVENOUS | Status: DC

## 2020-09-11 MED ORDER — ACETAMINOPHEN 500 MG PO TABS
1000.0000 mg | ORAL_TABLET | Freq: Once | ORAL | Status: AC
  Administered 2020-09-11: 1000 mg via ORAL
  Filled 2020-09-11: qty 2

## 2020-09-11 MED ORDER — SODIUM CHLORIDE 0.9 % IV BOLUS (SEPSIS)
1000.0000 mL | Freq: Once | INTRAVENOUS | Status: AC
  Administered 2020-09-11: 1000 mL via INTRAVENOUS

## 2020-09-11 MED ORDER — VANCOMYCIN HCL 2000 MG/400ML IV SOLN
2000.0000 mg | Freq: Once | INTRAVENOUS | Status: AC
  Administered 2020-09-11: 2000 mg via INTRAVENOUS
  Filled 2020-09-11: qty 400

## 2020-09-11 MED ORDER — SODIUM CHLORIDE 0.9 % IV SOLN: INTRAVENOUS | Status: DC

## 2020-09-11 MED ORDER — MORPHINE SULFATE (PF) 2 MG/ML IV SOLN
2.0000 mg | Freq: Once | INTRAVENOUS | Status: AC
  Administered 2020-09-11: 2 mg via INTRAVENOUS
  Filled 2020-09-11: qty 1

## 2020-09-11 MED ORDER — VANCOMYCIN HCL IN DEXTROSE 1-5 GM/200ML-% IV SOLN: 1000.0000 mg | Freq: Three times a day (TID) | INTRAVENOUS | Status: DC

## 2020-09-11 MED ORDER — SODIUM CHLORIDE 0.9 % IV SOLN
2.0000 g | Freq: Once | INTRAVENOUS | Status: AC
  Administered 2020-09-11: 2 g via INTRAVENOUS
  Filled 2020-09-11: qty 2

## 2020-09-11 MED ORDER — SODIUM CHLORIDE 0.9 % IV SOLN: 2.0000 g | Freq: Three times a day (TID) | INTRAVENOUS | Status: DC

## 2020-09-11 MED ORDER — LORAZEPAM 0.5 MG PO TABS
0.5000 mg | ORAL_TABLET | Freq: Once | ORAL | Status: AC
  Administered 2020-09-12: 0.5 mg via ORAL
  Filled 2020-09-11: qty 1

## 2020-09-11 NOTE — ED Notes (Signed)
Date and time results received: 09/11/20 10:24 PM(use smartphrase ".now" to insert current time)  Test: lactic acid Critical Value: 2.0  Name of Provider Notified: Hyman Hopes, [place]  Orders Received? Or Actions Taken?: see chart

## 2020-09-11 NOTE — ED Provider Notes (Signed)
[facility] EMERGENCY DEPARTMENT Provider Note   CSN: [id number] Arrival date & time: 09/11/20  2025     History Chief Complaint  Patient presents with  . Generalized Body Aches    Janice Wright is a 34 y.o. female with PMHx DM, HLD, Depression, chronic low back pain who presents to the ED today with complaint of gradual onset, constant, diffuse body aches that began around 11 AM today. Pt states she then proceeded to have a headache, chills, subjective fevers, and nausea. She reports history of "sepsis" in the past and states this feels similar. She is unsure what the cause of her sepsis was last time. She denies any recent sick contacts however she is unvaccinated against COVID 19. Pt has been taking Tylenol and Ibuprofen throughout the day without any relief prompting her to come to the ED. Last took Tylenol around 1 PM today and Ibuprofen around 6 PM tonight. Pt denies chest pain, shortness of breath, cough, abdominal pain, vomiting, diarrhea, urinary symptoms, vision changes, confusion, neck stiffness, rash, speech changes, or any other associated symptoms. Pt did recently have spinal cord stimulator removed on 08/12/2020 with post op check on 10/12; healing well. No signs of infection to site per patient.   Per chart review: Admission from 09/26/2017 - 09/29/2017 for sepsis s/2 UTI/acute right sided pyelonephritis  The history is provided by the patient and medical records.       Past Medical History:  Diagnosis Date  . Anxiety   . Chronic lower back pain   . Constipation due to opioid therapy   . Depression 2014   hx; "resolved"  . Fatty liver   . Hyperlipidemia   . Polycystic ovarian syndrome   . PONV (postoperative nausea and vomiting)   . Radiculopathy   . Type II diabetes mellitus (HCC) dx'd 2014    Patient Active Problem List   Diagnosis Date Noted  . Acute cystitis without hematuria   . Acute pyelonephritis   . Hypotension   . Sepsis (HCC) 09/26/2017  .  Sepsis secondary to UTI (HCC) 09/26/2017  . Dehydration 01/20/2017  . Bronchitis, acute 01/20/2017  . Gastroenteritis 01/20/2017  . Tachycardia 01/20/2017  . Chronic pain syndrome 01/20/2017  . Radiculopathy 04/01/2015    Past Surgical History:  Procedure Laterality Date  . BACK SURGERY     x 2  . BACK SURGERY    . CESAREAN SECTION  2011  . CESAREAN SECTION    . CHOLECYSTECTOMY    . KNEE ARTHROSCOPY Left 2007  . LAPAROSCOPIC CHOLECYSTECTOMY  2006  . LUMBAR DISC SURGERY  08/2008; 12/2010  . POSTERIOR LUMBAR FUSION  10/2012  . POSTERIOR LUMBAR FUSION  04/01/2015   revision/notes 04/01/2015  . SPINAL CORD STIMULATOR REMOVAL    . WISDOM TOOTH EXTRACTION     "2 @ a time"     OB History    Gravida  1   Para  0   Term  0   Preterm  0   AB  0   Living        SAB  0   TAB  0   Ectopic  0   Multiple      Live Births              History reviewed. No pertinent family history.  Social History   Tobacco Use  . Smoking status: Current Every Day Smoker    Packs/day: 0.50  . Smokeless tobacco: Never Used  Vaping Use  .  Vaping Use: Never used  Substance Use Topics  . Alcohol use: Not Currently  . Drug use: No    Home Medications Prior to Admission medications   Medication Sig Start Date End Date Taking? Authorizing Provider  dapagliflozin propanediol (FARXIGA) 10 MG TABS tablet Take 10 mg by mouth daily before breakfast. 11/05/19   Merlyn Albert, MD  glipiZIDE (GLUCOTROL) 5 MG tablet TAKE 2 TABLETS (10 MG TOTAL) BY MOUTH 2 (TWO) TIMES DAILY BEFORE A MEAL. 11/29/19   Merlyn Albert, MD  metFORMIN (GLUCOPHAGE) 500 MG tablet Take 2 tablets (1,000 mg total) by mouth 2 (two) times daily with a meal. 05/10/19   Merlyn Albert, MD  pregabalin (LYRICA) 100 MG capsule Take by mouth. 11/28/18 11/27/19  [provider]  sulfamethoxazole-trimethoprim (BACTRIM DS) 800-160 MG tablet Take 1 tablet by mouth 2 (two) times daily. 11/27/19   Merlyn Albert, MD     Allergies    Patient has no known allergies.  Review of Systems   Review of Systems  Constitutional: Positive for chills, fatigue and fever.  Eyes: Negative for visual disturbance.  Respiratory: Negative for cough and shortness of breath.   Cardiovascular: Negative for chest pain.  Gastrointestinal: Positive for nausea. Negative for abdominal pain, diarrhea and vomiting.  Genitourinary: Negative for dysuria and frequency.  Musculoskeletal: Positive for myalgias. Negative for neck pain.  Neurological: Positive for headaches. Negative for syncope, weakness and numbness.  Psychiatric/Behavioral: Negative for confusion.  All other systems reviewed and are negative.   Physical Exam Updated Vital Signs BP 97/62   Pulse (!) 140   Temp (!) 100.6 F (38.1 C)   Resp 18   Ht 5\' 4"  (1.626 m)   Wt 95.7 kg   LMP 09/04/2020   SpO2 93%   BMI 36.22 kg/m   Physical Exam Vitals and nursing note reviewed.  Constitutional:      Appearance: She is obese. She is ill-appearing. She is not diaphoretic.  HENT:     Head: Normocephalic and atraumatic.  Eyes:     Conjunctiva/sclera: Conjunctivae normal.  Neck:     Meningeal: Brudzinski's sign and Kernig's sign absent.  Cardiovascular:     Rate and Rhythm: Regular rhythm. Tachycardia present.     Pulses: Normal pulses.  Pulmonary:     Effort: Pulmonary effort is normal.     Breath sounds: Normal breath sounds. No wheezing, rhonchi or rales.  Abdominal:     Palpations: Abdomen is soft.     Tenderness: There is no abdominal tenderness. There is no guarding or rebound.  Musculoskeletal:        General: Tenderness present.     Cervical back: Neck supple.     Comments: Diffuse TTP s/2 body aches Surgical incision noted to left lower back; no erythema, edema or drainage  Skin:    General: Skin is warm and dry.  Neurological:     General: No focal deficit present.     Mental Status: She is alert and oriented to person, place, and time.      Cranial Nerves: No cranial nerve deficit.     ED Results / Procedures / Treatments   Labs (all labs ordered are listed, but only abnormal results are displayed) Labs Reviewed  LACTIC ACID, PLASMA - Abnormal; Notable for the following components:      Result Value   Lactic Acid, Venous 2.0 (*)    All other components within normal limits  LACTIC ACID, PLASMA - Abnormal; Notable for  the following components:   Lactic Acid, Venous 2.0 (*)    All other components within normal limits  COMPREHENSIVE METABOLIC PANEL - Abnormal; Notable for the following components:   Glucose, Bld 168 (*)    All other components within normal limits  CBC WITH DIFFERENTIAL/PLATELET - Abnormal; Notable for the following components:   WBC 18.6 (*)    Neutro Abs 15.4 (*)    Monocytes Absolute 1.3 (*)    Abs Immature Granulocytes 0.10 (*)    All other components within normal limits  URINALYSIS, ROUTINE W REFLEX MICROSCOPIC - Abnormal; Notable for the following components:   Color, Urine STRAW (*)    Glucose, UA 50 (*)    All other components within normal limits  RESPIRATORY PANEL BY RT PCR (FLU A&B, COVID)  RESPIRATORY PANEL BY PCR  CULTURE, BLOOD (ROUTINE X 2)  CULTURE, BLOOD (ROUTINE X 2)  URINE CULTURE  PROTIME-INR  APTT  PROCALCITONIN  POC URINE PREG, ED    EKG EKG Interpretation  Date/Time:  Friday September 11 2020 21:32:33 EST Ventricular Rate:  135 PR Interval:    QRS Duration: 80 QT Interval:  285 QTC Calculation: 428 R Axis:   70 Text Interpretation: Sinus tachycardia Multiple ventricular premature complexes Aberrant complex No STEMI Confirmed by Alona Bene [id number]) on 09/11/2020 9:34:00 PM   Radiology DG Chest Port 1 View  Result Date: 09/11/2020 CLINICAL DATA:  Generalized body aches, chills EXAM: PORTABLE CHEST 1 VIEW COMPARISON:  11/18/2018 FINDINGS: Single frontal view of the chest demonstrates an unremarkable cardiac silhouette. Linear consolidation at the right lung  base likely subsegmental atelectasis. No effusion or pneumothorax. No acute airspace disease. No acute bony abnormalities. IMPRESSION: 1. Minimal atelectasis at the right base. No acute airspace disease. Electronically Signed   By: Sharlet Salina M.D.   On: 09/11/2020 22:23    Procedures Procedures (including critical care time)  Medications Ordered in ED Medications  0.9 %  sodium chloride infusion ( Intravenous New Bag/Given 09/11/20 2212)  metroNIDAZOLE (FLAGYL) tablet 500 mg (500 mg Oral Given 09/11/20 2255)  vancomycin (VANCOREADY) IVPB 2000 mg/400 mL (2,000 mg Intravenous New Bag/Given 09/11/20 2254)  vancomycin (VANCOCIN) IVPB 1000 mg/200 mL premix (has no administration in time range)  ceFEPIme (MAXIPIME) 2 g in sodium chloride 0.9 % 100 mL IVPB (has no administration in time range)  sodium chloride 0.9 % bolus 1,000 mL (0 mLs Intravenous Stopped 09/11/20 2259)  acetaminophen (TYLENOL) tablet 1,000 mg (1,000 mg Oral Given 09/11/20 2207)  ceFEPIme (MAXIPIME) 2 g in sodium chloride 0.9 % 100 mL IVPB (0 g Intravenous Stopped 09/11/20 2315)  sodium chloride 0.9 % bolus 800 mL (0 mLs Intravenous Stopped 09/12/20 0005)  morphine 2 MG/ML injection 2 mg (2 mg Intravenous Given 09/11/20 2255)  LORazepam (ATIVAN) tablet 0.5 mg (0.5 mg Oral Given 09/12/20 0003)    ED Course  I have reviewed the triage vital signs and the nursing notes.  Pertinent labs & imaging results that were available during my care of the patient were reviewed by me and considered in my medical decision making (see chart for details).  Clinical Course as of Sep 12 5  Fri Sep 11, 2020  2225 WBC(!): 18.6 [MV]  2225 Lactic Acid, Venous(!!): 2.0 [MV]    Clinical Course User Index [MV] Milinda Antis   MDM Rules/Calculators/A&P                          34 year old female  who presents to the ED today complaining of diffuse body aches, subjective fevers, chills, nausea, headache that began earlier today.   She presents to the ED with ongoing symptoms that have not been alleviated with ibuprofen and Tylenol.  On arrival to the ED patient's initial temp is 99.4 however recheck orally 100.6.  She does feel warm on exam today.  She is also noted to be tachycardic in the 140s, per chart review patient does have a history of tachycardia and is typically in the 110s.  Initial blood pressure 116/68 however repeat 97/62.  Patient nontachypneic, satting 95% on room air.  Patient denies any recent sick contacts and denies any specific source of fever including cough, abdominal pain, urinary symptoms however she does meet sepsis criteria at this time.  We will hold off on antibiotics until additional lab work obtained as I am also suspicious heavily for COVID-19 today given patient is unvaccinated.  Will obtain Covid swab at this time. Pt without any focal neuro deficits or meningeal signs; low suspicion for meningitis or other intracranial abnormality causing symptoms  CBC has returned with a leukocytosis of 18.6 with a left shift and therefore code sepsis was initiated. Will start on empiric abx for unknown source at this time with 30 cc/kg with IBW - total of 1800 mLs.  CMP without electrolyte abnormalities. LFTs within normal limits.  Lactic acid elevated at 2.0 CXR clear  U/A negative for infection  Repeat lactic acid 2.0  Lab was called regarding COVID test - it appears the wrong test was ordered in triage when she was swabbed however they are able to change the order so that the result can populate into the chart. They report pt is negative. Considering symptoms began today question if COVID test is too early. I am still very suspicious for this as there are no other sources for her infection today however given vital signs and leukocytosis I feel pt requires admission at this time with unknown source of infection.   Discussed case with [facility] Dr. Carren RangZierle-Ghosh who agrees to evaluate patient for  admission.   Janice Wright was evaluated in Emergency Department on 09/12/2020 for the symptoms described in the history of present illness. She was evaluated in the context of the global COVID-19 pandemic, which necessitated consideration that the patient might be at risk for infection with the SARS-CoV-2 virus that causes COVID-19. Institutional protocols and algorithms that pertain to the evaluation of patients at risk for COVID-19 are in a state of rapid change based on information released by regulatory bodies including the CDC and federal and state organizations. These policies and algorithms were followed during the patient's care in the ED.  This note was prepared using Dragon voice recognition software and may include unintentional dictation errors due to the inherent limitations of voice recognition software.  Final Clinical Impression(s) / ED Diagnoses Final diagnoses:  Sepsis without acute organ dysfunction, due to unspecified organism [facility](HCC)  Person under investigation for COVID-19    Rx / DC Orders ED Discharge Orders    None       Tanda RockersVenter, Laityn Bensen, PA-C 09/12/20 0006    Maia PlanLong, Joshua G, MD 09/24/20 [phone]

## 2020-09-11 NOTE — Progress Notes (Signed)
Pharmacy Antibiotic Note  Janice Wright is a 34 y.o. female admitted on 09/11/2020 with sepsis.  Pharmacy has been consulted for Vancomycin and Cefepime dosing. Pt also on Flagyl  Plan: Cefepime 2gm IV q8h Vancomycin 2000 mg IV now then 1000 mg IV Q 8 hrs.  Will f/u renal function, micro data, and pt's clinical condition Vanc levels prn   Height: 5\' 4"  (162.6 cm) Weight: 95.7 kg (211 lb) IBW/kg (Calculated) : 54.7  Temp (24hrs), Avg:100 F (37.8 C), Min:99.4 F (37.4 C), Max:100.6 F (38.1 C)  Recent Labs  Lab 09/11/20 2145  WBC 18.6*  CREATININE 0.69  LATICACIDVEN 2.0*    Estimated Creatinine Clearance: 112.3 mL/min (by C-G formula based on SCr of 0.69 mg/dL).    No Known Allergies  Antimicrobials this admission: 10/12 Vanc >>  10/12 Cefepime >>  10/12 Flagyl >>  Microbiology results: Pending  Thank you for allowing pharmacy to be a part of this patient's care.  12/12, PharmD, BCPS Please see amion for complete clinical pharmacist phone list\ 09/11/2020 10:25 PM

## 2020-09-11 NOTE — ED Triage Notes (Signed)
Pt c/o generalized body aches with chills and body aches that started today around 11am; pt took ibuprofen at 6pm tonight

## 2020-09-12 DIAGNOSIS — A419 Sepsis, unspecified organism: Secondary | ICD-10-CM | POA: Diagnosis not present

## 2020-09-12 LAB — PROCALCITONIN: Procalcitonin: <0.1 ng/mL

## 2020-09-12 MED ORDER — METRONIDAZOLE 500 MG PO TABS: 500.0000 mg | ORAL_TABLET | Freq: Two times a day (BID) | ORAL | 0 refills | Status: DC

## 2020-09-12 MED ORDER — CEFUROXIME AXETIL 500 MG PO TABS
500.0000 mg | ORAL_TABLET | Freq: Two times a day (BID) | ORAL | 0 refills | Status: DC
Start: 2020-09-12 — End: 2022-03-31

## 2020-09-12 NOTE — Progress Notes (Signed)
Patient refusing admission. Counseled about what sepsis is, and why it needs to be treated in the hospital. Patient still refuses. Counseled on potential risks if not treated, patient verbalizes understanding. Informed ER provider that patient is refusing admission.

## 2020-09-12 NOTE — ED Notes (Signed)
Pt is insisting that she leave. Talked with pt to try and get her to stay. Discussed the risks of her leaving and told her it would be AMA. Pt still wants to leave and I will notify the doc.

## 2020-09-12 NOTE — Discharge Instructions (Signed)
Your leaving the hospital AGAINST MEDICAL ADVICE. You have been diagnosed with sepsis, which is a serious infection and needs treatment with appropriate antibiotics. Antibiotics at home may not be strong enough to treat the infection adequately. Infections which are not controlled by antibiotics can cause serious damage to your organs and can even be fatal. If you change your mind at any time, you are welcome to come back and we will admit you to the hospital to continue your antibiotics.

## 2020-09-12 NOTE — ED Provider Notes (Signed)
Patient has decided that she does not want to be admitted to the hospital and wishes to leave AGAINST MEDICAL ADVICE. I have discussed with her the risks of leaving AGAINST MEDICAL ADVICE including inadequately treated sepsis which can lead to organ failure and death. Patient still states that she wishes to leave AGAINST MEDICAL ADVICE. Last hospital admission for sepsis was secondary to UTI. She shows no evidence of UTI today. We will prescribe broad-spectrum antibiotics of cefuroxime and metronidazole. Patient is advised to return if she is having any problems whatsoever or if she changes her mind about being admitted. Patient expresses understanding.   Dione Booze, MD 09/12/20 [phone]

## 2020-09-13 LAB — URINE CULTURE

## 2020-09-16 LAB — CULTURE, BLOOD (ROUTINE X 2)
Culture: NO GROWTH
Culture: NO GROWTH
Special Requests: ADEQUATE
Special Requests: ADEQUATE

## 2020-09-28 ENCOUNTER — Other Ambulatory Visit: Payer: Self-pay | Admitting: Orthopedic Surgery

## 2020-09-28 DIAGNOSIS — M533 Sacrococcygeal disorders, not elsewhere classified: Secondary | ICD-10-CM

## 2020-10-20 ENCOUNTER — Encounter (HOSPITAL_BASED_OUTPATIENT_CLINIC_OR_DEPARTMENT_OTHER): Payer: Medicaid Other | Admitting: Internal Medicine

## 2020-10-29 ENCOUNTER — Other Ambulatory Visit: Payer: Medicaid Other

## 2020-11-17 ENCOUNTER — Inpatient Hospital Stay: Admission: RE | Admit: 2020-11-17 | Payer: Medicaid Other | Source: Ambulatory Visit

## 2020-11-17 ENCOUNTER — Other Ambulatory Visit: Payer: Medicaid Other

## 2020-12-01 ENCOUNTER — Other Ambulatory Visit: Payer: Self-pay

## 2020-12-01 ENCOUNTER — Ambulatory Visit
Admission: RE | Admit: 2020-12-01 | Discharge: 2020-12-01 | Disposition: A | Discharge status: Home / Self Care | Payer: Medicaid Other | Source: Ambulatory Visit | Attending: Orthopedic Surgery | Admitting: Orthopedic Surgery

## 2020-12-01 DIAGNOSIS — M533 Sacrococcygeal disorders, not elsewhere classified: Secondary | ICD-10-CM

## 2021-02-04 ENCOUNTER — Other Ambulatory Visit: Payer: Self-pay | Admitting: Orthopedic Surgery

## 2021-02-15 NOTE — Pre-Procedure Instructions (Signed)
Surgical Instructions:    Your procedure is scheduled on Thursday, 02/18/21 (11:11 AM- 12:53 PM).  Report to [facility] Main Entrance "A" at 08:10 A.M., then check in with the Admitting office.  Call this number if you have any questions prior to, or have any problems the morning of surgery:  [phone]    Remember:  Do not eat after midnight the night before your surgery.  You may drink clear liquids until 08:10 AM the morning of your surgery.   Clear liquids allowed are: Water, Non-Citrus Juices (without pulp), Carbonated Beverages, Clear Tea, Black Coffee Only, and Gatorade.  Please complete your PRE-SURGERY 10 oz Bottled Water that was provided to you by 08:10 AM the morning of surgery.  Please, if able, drink it in one setting. DO NOT SIP.     Take these medicines the morning of surgery with A SIP OF WATER: pregabalin (LYRICA)    As of today, STOP taking any Aspirin (unless otherwise instructed by your surgeon) Aleve, Naproxen, Ibuprofen, Motrin, Advil, Goody's, BC's, all herbal medications, fish oil, and all vitamins.           WHAT DO I DO ABOUT MY DIABETES MEDICATION?   Do not take dapagliflozin propanediol (FARXIGA) THE DAY BEFORE  OR THE MORNING OF SURGERY.  Do not take glipiZIDE (GLUCOTROL) THE EVENING BEFORE or THE MORNING OF SURGERY.  Do not take metFORMIN (GLUCOPHAGE) THE MORNING OF SURGERY.   The day of surgery, do not take other diabetes injectables, including Semaglutide.      HOW TO MANAGE YOUR DIABETES BEFORE AND AFTER SURGERY  Why is it important to control my blood sugar before and after surgery? . Improving blood sugar levels before and after surgery helps healing and can limit problems. . A way of improving blood sugar control is eating a healthy diet by: o  Eating less sugar and carbohydrates o  Increasing activity/exercise o  Talking with your doctor about reaching your blood sugar goals . High blood sugars (greater than 180 mg/dL) can raise  your risk of infections and slow your recovery, so you will need to focus on controlling your diabetes during the weeks before surgery. . Make sure that the doctor who takes care of your diabetes knows about your planned surgery including the date and location.  How do I manage my blood sugar before surgery? . Check your blood sugar at least 4 times a day, starting 2 days before surgery, to make sure that the level is not too high or low. . Check your blood sugar the morning of your surgery when you wake up and every 2 hours until you get to the Short Stay unit. o If your blood sugar is less than 70 mg/dL, you will need to treat for low blood sugar: - Do not take insulin. - Treat a low blood sugar (less than 70 mg/dL) with  cup of clear juice (cranberry or apple), 4 glucose tablets, OR glucose gel. - Recheck blood sugar in 15 minutes after treatment (to make sure it is greater than 70 mg/dL). If your blood sugar is not greater than 70 mg/dL on recheck, call [phone] for further instructions. . Report your blood sugar to the short stay nurse when you get to Short Stay.  . If you are admitted to the hospital after surgery: o Your blood sugar will be checked by the staff and you will probably be given insulin after surgery (instead of oral diabetes medicines) to make sure you have good  blood sugar levels. o The goal for blood sugar control after surgery is 80-180 mg/dL.     Special instructions:   [place]- Preparing For Surgery  Before surgery, you can play an important role. Because skin is not sterile, your skin needs to be as free of germs as possible. You can reduce the number of germs on your skin by washing with CHG (chlorahexidine gluconate) Soap before surgery.  CHG is an antiseptic cleaner which kills germs and bonds with the skin to continue killing germs even after washing.    Oral Hygiene is also important to reduce your risk of infection.  Remember - BRUSH YOUR TEETH THE  MORNING OF SURGERY WITH YOUR REGULAR TOOTHPASTE  Please do not use if you have an allergy to CHG or antibacterial soaps. If your skin becomes reddened/irritated stop using the CHG.  Do not shave (including legs and underarms) for at least 48 hours prior to first CHG shower. It is OK to shave your face.  Please follow these instructions carefully.   1. Shower the NIGHT BEFORE SURGERY and the MORNING OF SURGERY  2. If you chose to wash your hair, wash your hair first as usual with your normal shampoo.  3. After you shampoo, rinse your hair and body thoroughly to remove the shampoo.  4. Wash Face and genitals (private parts) with your normal soap.   5. Use CHG Soap as you would any other liquid soap. You can apply CHG directly to the skin and wash gently with a scrungie or a clean washcloth.   6. Apply the CHG Soap to your body ONLY FROM THE NECK DOWN.  Do not use on open wounds or open sores. Avoid contact with your eyes, ears, mouth and genitals (private parts). Wash Face and genitals (private parts)  with your normal soap.   7. Wash thoroughly, paying special attention to the area where your surgery will be performed.  8. Thoroughly rinse your body with warm water from the neck down.  9. DO NOT shower/wash with your normal soap after using and rinsing off the CHG Soap.  10. Pat yourself dry with a CLEAN TOWEL.  11. Wear CLEAN PAJAMAS to bed the night before surgery.  12. Place CLEAN SHEETS on your bed the night before your surgery.  13. DO NOT SLEEP WITH PETS.   Day of Surgery: SHOWER with CHG soap. Brush your teeth WITH YOUR REGULAR TOOTHPASTE. Wear Clean/Comfortable clothing the morning of surgery. Do not apply any deodorants/lotions.   Do not wear jewelry, make up, or nail polish. Do not shave 48 hours prior to surgery.    Do NOT Smoke (Tobacco/Vaping) or drink Alcohol 24 hours prior to your procedure. Do not bring valuables to the hospital. [facility] is not  responsible for any belongings or valuables.  If you use a CPAP at night, you may bring all equipment for your overnight stay.   Contacts, glasses, or dentures may not be worn into surgery, please bring cases for these belongings.   For patients admitted to the hospital, discharge time will be determined by your treatment team.   Patients discharged the day of surgery will not be allowed to drive home, and someone needs to stay with them for 24 hours.    Please read over the following fact sheets that you were given.

## 2021-02-16 ENCOUNTER — Encounter (HOSPITAL_COMMUNITY): Payer: Self-pay

## 2021-02-16 ENCOUNTER — Encounter (HOSPITAL_COMMUNITY)
Admission: RE | Admit: 2021-02-16 | Discharge: 2021-02-16 | Disposition: A | Discharge status: Home / Self Care | Payer: Medicare Other | Source: Ambulatory Visit | Attending: Orthopedic Surgery | Admitting: Orthopedic Surgery

## 2021-02-16 ENCOUNTER — Other Ambulatory Visit: Payer: Self-pay

## 2021-02-16 DIAGNOSIS — Z20822 Contact with and (suspected) exposure to covid-19: Secondary | ICD-10-CM | POA: Insufficient documentation

## 2021-02-16 DIAGNOSIS — E118 Type 2 diabetes mellitus with unspecified complications: Secondary | ICD-10-CM | POA: Insufficient documentation

## 2021-02-16 DIAGNOSIS — Z01818 Encounter for other preprocedural examination: Secondary | ICD-10-CM | POA: Diagnosis not present

## 2021-02-16 HISTORY — DX: Unspecified osteoarthritis, unspecified site: M19.90

## 2021-02-16 HISTORY — DX: Tachycardia, unspecified: R00.0

## 2021-02-16 LAB — CBC WITH DIFFERENTIAL/PLATELET
Abs Immature Granulocytes: 0.03 10*3/uL (ref 0.00–0.07)
Basophils Absolute: 0.1 10*3/uL (ref 0.0–0.1)
Basophils Relative: 1 %
Eosinophils Absolute: 0.2 10*3/uL (ref 0.0–0.5)
Eosinophils Relative: 2 %
HCT: 50.3 % — ABNORMAL HIGH (ref 36.0–46.0)
Hemoglobin: 16.1 g/dL — ABNORMAL HIGH (ref 12.0–15.0)
Immature Granulocytes: 0 %
Lymphocytes Relative: 35 %
Lymphs Abs: 4.1 10*3/uL — ABNORMAL HIGH (ref 0.7–4.0)
MCH: 29.8 pg (ref 26.0–34.0)
MCHC: 32 g/dL (ref 30.0–36.0)
MCV: 93 fL (ref 80.0–100.0)
Monocytes Absolute: 0.8 10*3/uL (ref 0.1–1.0)
Monocytes Relative: 7 %
Neutro Abs: 6.3 10*3/uL (ref 1.7–7.7)
Neutrophils Relative %: 55 %
Platelets: 369 10*3/uL (ref 150–400)
RBC: 5.41 MIL/uL — ABNORMAL HIGH (ref 3.87–5.11)
RDW: 12.8 % (ref 11.5–15.5)
WBC: 11.5 10*3/uL — ABNORMAL HIGH (ref 4.0–10.5)
nRBC: 0 % (ref 0.0–0.2)

## 2021-02-16 LAB — URINALYSIS, ROUTINE W REFLEX MICROSCOPIC
Bacteria, UA: NONE SEEN
Bilirubin Urine: NEGATIVE
Glucose, UA: >=500 mg/dL — AB
Ketones, ur: NEGATIVE mg/dL
Leukocytes,Ua: NEGATIVE
Nitrite: NEGATIVE
Protein, ur: NEGATIVE mg/dL
RBC / HPF: >50 RBC/hpf — ABNORMAL HIGH (ref 0–5)
Specific Gravity, Urine: 1.025 (ref 1.005–1.030)
pH: 6 (ref 5.0–8.0)

## 2021-02-16 LAB — COMPREHENSIVE METABOLIC PANEL
ALT: 16 U/L (ref 0–44)
AST: 19 U/L (ref 15–41)
Albumin: 4 g/dL (ref 3.5–5.0)
Alkaline Phosphatase: 70 U/L (ref 38–126)
Anion gap: 8 (ref 5–15)
BUN: <5 mg/dL — ABNORMAL LOW (ref 6–20)
CO2: 26 mmol/L (ref 22–32)
Calcium: 9.3 mg/dL (ref 8.9–10.3)
Chloride: 104 mmol/L (ref 98–111)
Creatinine, Ser: 0.61 mg/dL (ref 0.44–1.00)
GFR, Estimated: >60 mL/min (ref >60)
Glucose, Bld: 103 mg/dL — ABNORMAL HIGH (ref 70–99)
Potassium: 4 mmol/L (ref 3.5–5.1)
Sodium: 138 mmol/L (ref 135–145)
Total Bilirubin: 0.5 mg/dL (ref 0.3–1.2)
Total Protein: 7.6 g/dL (ref 6.5–8.1)

## 2021-02-16 LAB — GLUCOSE, CAPILLARY: Glucose-Capillary: 105 mg/dL — ABNORMAL HIGH (ref 70–99)

## 2021-02-16 LAB — APTT: aPTT: 31 seconds (ref 24–36)

## 2021-02-16 LAB — PROTIME-INR
INR: 0.9 (ref 0.8–1.2)
Prothrombin Time: 12.5 seconds (ref 11.4–15.2)

## 2021-02-16 LAB — SURGICAL PCR SCREEN
MRSA, PCR: NEGATIVE
Staphylococcus aureus: NEGATIVE

## 2021-02-16 LAB — SARS CORONAVIRUS 2 (TAT 6-24 HRS): SARS Coronavirus 2: NEGATIVE

## 2021-02-16 LAB — HEMOGLOBIN A1C
Hgb A1c MFr Bld: 5.7 % — ABNORMAL HIGH (ref 4.8–5.6)
Mean Plasma Glucose: 116.89 mg/dL

## 2021-02-16 NOTE — Progress Notes (Signed)
PCP - [place]. Milinda Cave, NP Cardiologist - Denies  PPM/ICD - Denies  Chest x-ray - 09/11/20  EKG - 02/16/21 Stress Test - Denies ECHO - 09/28/17 Cardiac Cath - Denies  Sleep Study - Denies  Fasting Blood Sugar 90-95 Checks Blood Sugar 2x weekly. A1C obtained. CBG at PAT appointment was 105  Blood Thinner Instructions: N/A Aspirin Instructions: N/A  ERAS Protcol - Yes PRE-SURGERY Ensure or G2- 10 oz water given  COVID TEST- 02/16/21   Anesthesia review: No  Patient denies shortness of breath, fever, cough and chest pain at PAT appointment   All instructions explained to the patient, with a verbal understanding of the material. Patient agrees to go over the instructions while at home for a better understanding. Patient also instructed to self quarantine after being tested for COVID-19. The opportunity to ask questions was provided.

## 2021-02-17 LAB — TYPE AND SCREEN
ABO/RH(D): O POS
Antibody Screen: NEGATIVE

## 2021-02-18 ENCOUNTER — Encounter (HOSPITAL_COMMUNITY): Payer: Self-pay | Admitting: Orthopedic Surgery

## 2021-02-18 ENCOUNTER — Ambulatory Visit (HOSPITAL_COMMUNITY): Payer: Medicare Other

## 2021-02-18 ENCOUNTER — Ambulatory Visit (HOSPITAL_COMMUNITY): Payer: Medicare Other | Admitting: Physician Assistant

## 2021-02-18 ENCOUNTER — Encounter (HOSPITAL_COMMUNITY)
Admission: RE | Disposition: A | Discharge status: Home / Self Care | Payer: Self-pay | Source: Home / Self Care | Attending: Orthopedic Surgery

## 2021-02-18 ENCOUNTER — Ambulatory Visit (HOSPITAL_COMMUNITY)
Admission: RE | Admit: 2021-02-18 | Discharge: 2021-02-18 | Disposition: A | Discharge status: Home / Self Care | Payer: Medicare Other | Attending: Orthopedic Surgery | Admitting: Orthopedic Surgery

## 2021-02-18 ENCOUNTER — Other Ambulatory Visit: Payer: Self-pay

## 2021-02-18 DIAGNOSIS — M9904 Segmental and somatic dysfunction of sacral region: Secondary | ICD-10-CM | POA: Diagnosis present

## 2021-02-18 DIAGNOSIS — M545 Low back pain, unspecified: Secondary | ICD-10-CM | POA: Insufficient documentation

## 2021-02-18 DIAGNOSIS — Z419 Encounter for procedure for purposes other than remedying health state, unspecified: Secondary | ICD-10-CM

## 2021-02-18 DIAGNOSIS — F17210 Nicotine dependence, cigarettes, uncomplicated: Secondary | ICD-10-CM | POA: Diagnosis not present

## 2021-02-18 DIAGNOSIS — Z6833 Body mass index (BMI) 33.0-33.9, adult: Secondary | ICD-10-CM | POA: Diagnosis not present

## 2021-02-18 DIAGNOSIS — E669 Obesity, unspecified: Secondary | ICD-10-CM | POA: Diagnosis not present

## 2021-02-18 DIAGNOSIS — Z79899 Other long term (current) drug therapy: Secondary | ICD-10-CM | POA: Diagnosis not present

## 2021-02-18 DIAGNOSIS — Z7984 Long term (current) use of oral hypoglycemic drugs: Secondary | ICD-10-CM | POA: Diagnosis not present

## 2021-02-18 HISTORY — PX: SACROILIAC JOINT FUSION: SHX6088

## 2021-02-18 LAB — GLUCOSE, CAPILLARY
Glucose-Capillary: 76 mg/dL (ref 70–99)
Glucose-Capillary: 142 mg/dL — ABNORMAL HIGH (ref 70–99)
Glucose-Capillary: 93 mg/dL (ref 70–99)
Glucose-Capillary: 87 mg/dL (ref 70–99)

## 2021-02-18 LAB — POCT PREGNANCY, URINE: Preg Test, Ur: NEGATIVE

## 2021-02-18 SURGERY — SACROILIAC JOINT FUSION
Anesthesia: General | Laterality: Left

## 2021-02-18 MED ORDER — FENTANYL CITRATE (PF) 100 MCG/2ML IJ SOLN
INTRAMUSCULAR | Status: DC | PRN
  Administered 2021-02-18 (×2): 50 ug via INTRAVENOUS
  Administered 2021-02-18: 150 ug via INTRAVENOUS

## 2021-02-18 MED ORDER — PROPOFOL 10 MG/ML IV BOLUS
INTRAVENOUS | Status: DC | PRN
  Administered 2021-02-18: 200 mg via INTRAVENOUS

## 2021-02-18 MED ORDER — PROPOFOL 500 MG/50ML IV EMUL
INTRAVENOUS | Status: DC | PRN
  Administered 2021-02-18: 20 ug/kg/min via INTRAVENOUS

## 2021-02-18 MED ORDER — LIDOCAINE 2% (20 MG/ML) 5 ML SYRINGE
INTRAMUSCULAR | Status: DC | PRN
  Administered 2021-02-18: 80 mg via INTRAVENOUS

## 2021-02-18 MED ORDER — HYDROCODONE-ACETAMINOPHEN 7.5-325 MG PO TABS: 1.0000 | ORAL_TABLET | Freq: Four times a day (QID) | ORAL | 0 refills | Status: DC | PRN

## 2021-02-18 MED ORDER — CHLORHEXIDINE GLUCONATE 0.12 % MT SOLN
15.0000 mL | Freq: Once | OROMUCOSAL | Status: AC
  Administered 2021-02-18: 15 mL via OROMUCOSAL
  Filled 2021-02-18: qty 15

## 2021-02-18 MED ORDER — BUPIVACAINE LIPOSOME 1.3 % IJ SUSP
INTRAMUSCULAR | Status: DC | PRN
  Administered 2021-02-18: 16.5 mL
  Administered 2021-02-18: 3.5 mL

## 2021-02-18 MED ORDER — PROMETHAZINE HCL 25 MG/ML IJ SOLN
6.2500 mg | INTRAMUSCULAR | Status: DC | PRN
  Administered 2021-02-18: 6.25 mg via INTRAVENOUS

## 2021-02-18 MED ORDER — ONDANSETRON HCL 4 MG/2ML IJ SOLN
INTRAMUSCULAR | Status: DC | PRN
  Administered 2021-02-18: 4 mg via INTRAVENOUS

## 2021-02-18 MED ORDER — BUPIVACAINE-EPINEPHRINE (PF) 0.25% -1:200000 IJ SOLN
INTRAMUSCULAR | Status: DC | PRN
  Administered 2021-02-18: 3.5 mL
  Administered 2021-02-18: 16.5 mL

## 2021-02-18 MED ORDER — DEXTROSE 50 % IV SOLN
12.5000 g | Freq: Once | INTRAVENOUS | Status: AC
  Administered 2021-02-18: 12.5 g via INTRAVENOUS
  Filled 2021-02-18: qty 50

## 2021-02-18 MED ORDER — KETAMINE HCL 50 MG/5ML IJ SOSY
PREFILLED_SYRINGE | INTRAMUSCULAR | Status: AC
  Filled 2021-02-18: qty 5

## 2021-02-18 MED ORDER — BUPIVACAINE-EPINEPHRINE (PF) 0.25% -1:200000 IJ SOLN
INTRAMUSCULAR | Status: AC
  Filled 2021-02-18: qty 30

## 2021-02-18 MED ORDER — ONDANSETRON HCL 4 MG/2ML IJ SOLN
INTRAMUSCULAR | Status: AC
  Filled 2021-02-18: qty 2

## 2021-02-18 MED ORDER — ROCURONIUM BROMIDE 10 MG/ML (PF) SYRINGE
PREFILLED_SYRINGE | INTRAVENOUS | Status: DC | PRN
  Administered 2021-02-18: 50 mg via INTRAVENOUS
  Administered 2021-02-18: 10 mg via INTRAVENOUS

## 2021-02-18 MED ORDER — SUGAMMADEX SODIUM 200 MG/2ML IV SOLN
INTRAVENOUS | Status: DC | PRN
  Administered 2021-02-18: 200 mg via INTRAVENOUS

## 2021-02-18 MED ORDER — BUPIVACAINE LIPOSOME 1.3 % IJ SUSP
INTRAMUSCULAR | Status: AC
  Filled 2021-02-18: qty 20

## 2021-02-18 MED ORDER — PHENYLEPHRINE HCL (PRESSORS) 10 MG/ML IV SOLN
INTRAVENOUS | Status: DC | PRN
  Administered 2021-02-18: 80 ug via INTRAVENOUS

## 2021-02-18 MED ORDER — OXYCODONE HCL 5 MG PO TABS: 5.0000 mg | ORAL_TABLET | Freq: Once | ORAL | Status: DC | PRN

## 2021-02-18 MED ORDER — FENTANYL CITRATE (PF) 100 MCG/2ML IJ SOLN
25.0000 ug | INTRAMUSCULAR | Status: DC | PRN
  Administered 2021-02-18 (×2): 25 ug via INTRAVENOUS
  Administered 2021-02-18: 50 ug via INTRAVENOUS

## 2021-02-18 MED ORDER — POVIDONE-IODINE 7.5 % EX SOLN
Freq: Once | CUTANEOUS | Status: DC
  Filled 2021-02-18: qty 118

## 2021-02-18 MED ORDER — LACTATED RINGERS IV SOLN: INTRAVENOUS | Status: DC

## 2021-02-18 MED ORDER — DEXAMETHASONE SODIUM PHOSPHATE 10 MG/ML IJ SOLN
INTRAMUSCULAR | Status: AC
  Filled 2021-02-18: qty 1

## 2021-02-18 MED ORDER — DEXAMETHASONE SODIUM PHOSPHATE 10 MG/ML IJ SOLN
INTRAMUSCULAR | Status: DC | PRN
  Administered 2021-02-18: 5 mg via INTRAVENOUS

## 2021-02-18 MED ORDER — CEFAZOLIN SODIUM-DEXTROSE 2-4 GM/100ML-% IV SOLN
2.0000 g | INTRAVENOUS | Status: AC
  Administered 2021-02-18: 2 g via INTRAVENOUS
  Filled 2021-02-18: qty 100

## 2021-02-18 MED ORDER — METHOCARBAMOL 500 MG PO TABS: 500.0000 mg | ORAL_TABLET | Freq: Four times a day (QID) | ORAL | 1 refills | Status: DC | PRN

## 2021-02-18 MED ORDER — FENTANYL CITRATE (PF) 250 MCG/5ML IJ SOLN
INTRAMUSCULAR | Status: AC
  Filled 2021-02-18: qty 5

## 2021-02-18 MED ORDER — OXYCODONE HCL 5 MG/5ML PO SOLN: 5.0000 mg | Freq: Once | ORAL | Status: DC | PRN

## 2021-02-18 MED ORDER — DIPHENHYDRAMINE HCL 50 MG/ML IJ SOLN
INTRAMUSCULAR | Status: DC | PRN
  Administered 2021-02-18: 12.5 mg via INTRAVENOUS

## 2021-02-18 MED ORDER — ORAL CARE MOUTH RINSE: 15.0000 mL | Freq: Once | OROMUCOSAL | Status: AC

## 2021-02-18 MED ORDER — MIDAZOLAM HCL 2 MG/2ML IJ SOLN
INTRAMUSCULAR | Status: AC
  Filled 2021-02-18: qty 2

## 2021-02-18 MED ORDER — PROMETHAZINE HCL 25 MG/ML IJ SOLN
INTRAMUSCULAR | Status: AC
  Filled 2021-02-18: qty 1

## 2021-02-18 MED ORDER — SCOPOLAMINE 1 MG/3DAYS TD PT72
MEDICATED_PATCH | TRANSDERMAL | Status: DC | PRN
  Administered 2021-02-18: 1 via TRANSDERMAL

## 2021-02-18 MED ORDER — FENTANYL CITRATE (PF) 100 MCG/2ML IJ SOLN
INTRAMUSCULAR | Status: AC
  Filled 2021-02-18: qty 2

## 2021-02-18 MED ORDER — APREPITANT 40 MG PO CAPS
40.0000 mg | ORAL_CAPSULE | Freq: Once | ORAL | Status: AC
  Administered 2021-02-18: 40 mg via ORAL
  Filled 2021-02-18: qty 1

## 2021-02-18 MED ORDER — DIPHENHYDRAMINE HCL 50 MG/ML IJ SOLN
INTRAMUSCULAR | Status: AC
  Filled 2021-02-18: qty 1

## 2021-02-18 MED ORDER — MIDAZOLAM HCL 5 MG/5ML IJ SOLN
INTRAMUSCULAR | Status: DC | PRN
  Administered 2021-02-18: 2 mg via INTRAVENOUS

## 2021-02-18 SURGICAL SUPPLY — 64 items
BENZOIN TINCTURE PRP APPL 2/3 (GAUZE/BANDAGES/DRESSINGS) ×2 IMPLANT
BIT DRILL CANNULATED 7.0X3.2MM (BIT) ×1 IMPLANT
BLADE CLIPPER SURG (BLADE) ×2 IMPLANT
BLADE SURG 10 STRL SS (BLADE) ×2 IMPLANT
CANISTER SUCT 3000ML PPV (MISCELLANEOUS) ×2 IMPLANT
CLSR STERI-STRIP ANTIMIC 1/2X4 (GAUZE/BANDAGES/DRESSINGS) ×2 IMPLANT
COVER SURGICAL LIGHT HANDLE (MISCELLANEOUS) ×4 IMPLANT
COVER WAND RF STERILE (DRAPES) ×2 IMPLANT
DRAPE C-ARM 42X72 X-RAY (DRAPES) ×2 IMPLANT
DRAPE C-ARMOR (DRAPES) ×2 IMPLANT
DRAPE INCISE IOBAN 66X45 STRL (DRAPES) ×2 IMPLANT
DRAPE POUCH INSTRU U-SHP 10X18 (DRAPES) ×2 IMPLANT
DRAPE SURG 17X23 STRL (DRAPES) ×6 IMPLANT
DRILL CANNULATED 7.0X3.2MM (BIT) ×2
DURAPREP 26ML APPLICATOR (WOUND CARE) ×2 IMPLANT
ELECT CAUTERY BLADE 6.4 (BLADE) ×2 IMPLANT
ELECT REM PT RETURN 9FT ADLT (ELECTROSURGICAL) ×2
ELECTRODE REM PT RTRN 9FT ADLT (ELECTROSURGICAL) ×1 IMPLANT
GAUZE 4X4 16PLY RFD (DISPOSABLE) ×2 IMPLANT
GAUZE SPONGE 4X4 12PLY STRL (GAUZE/BANDAGES/DRESSINGS) ×2 IMPLANT
GLOVE BIO SURGEON STRL SZ7 (GLOVE) ×2 IMPLANT
GLOVE BIO SURGEON STRL SZ8 (GLOVE) ×2 IMPLANT
GLOVE SRG 8 PF TXTR STRL LF DI (GLOVE) ×1 IMPLANT
GLOVE SURG UNDER POLY LF SZ7 (GLOVE) ×2 IMPLANT
GLOVE SURG UNDER POLY LF SZ8 (GLOVE) ×1
GOWN STRL REUS W/ TWL LRG LVL3 (GOWN DISPOSABLE) ×2 IMPLANT
GOWN STRL REUS W/ TWL XL LVL3 (GOWN DISPOSABLE) ×1 IMPLANT
GOWN STRL REUS W/TWL LRG LVL3 (GOWN DISPOSABLE) ×2
GOWN STRL REUS W/TWL XL LVL3 (GOWN DISPOSABLE) ×1
IMPL IFUSE 7.0MMX45MM (Rod) ×1 IMPLANT
IMPL IFUSE 7.0X50MM (Rod) ×1 IMPLANT
IMPL IFUSE 7.0X55MM (Rod) ×1 IMPLANT
IMPLANT IFUSE 7.0MMX45MM (Rod) ×2 IMPLANT
IMPLANT IFUSE 7.0X50MM (Rod) ×2 IMPLANT
IMPLANT IFUSE 7.0X55MM (Rod) ×2 IMPLANT
KIT BASIN OR (CUSTOM PROCEDURE TRAY) ×2 IMPLANT
KIT TURNOVER KIT B (KITS) ×2 IMPLANT
MANIFOLD NEPTUNE II (INSTRUMENTS) ×2 IMPLANT
NEEDLE 22X1 1/2 (OR ONLY) (NEEDLE) ×2 IMPLANT
NEEDLE HYPO 25GX1X1/2 BEV (NEEDLE) ×2 IMPLANT
NS IRRIG 1000ML POUR BTL (IV SOLUTION) ×2 IMPLANT
PACK UNIVERSAL I (CUSTOM PROCEDURE TRAY) ×2 IMPLANT
PAD ARMBOARD 7.5X6 YLW CONV (MISCELLANEOUS) ×4 IMPLANT
PENCIL BUTTON HOLSTER BLD 10FT (ELECTRODE) ×2 IMPLANT
PIN PUSH 3.2MM (PIN) ×2 IMPLANT
PIN STEINMAN (PIN) ×2 IMPLANT
PIN STEINMAN 3.2MM (PIN) ×6 IMPLANT
SPONGE LAP 18X18 RF (DISPOSABLE) ×2 IMPLANT
STAPLER VISISTAT 35W (STAPLE) ×2 IMPLANT
STRIP CLOSURE SKIN 1/2X4 (GAUZE/BANDAGES/DRESSINGS) ×2 IMPLANT
SUT MNCRL AB 4-0 PS2 18 (SUTURE) ×2 IMPLANT
SUT VIC AB 0 CT1 18XCR BRD 8 (SUTURE) ×1 IMPLANT
SUT VIC AB 0 CT1 8-18 (SUTURE) ×1
SUT VIC AB 1 CT1 18XCR BRD 8 (SUTURE) ×1 IMPLANT
SUT VIC AB 1 CT1 8-18 (SUTURE) ×1
SUT VIC AB 2-0 CT2 18 VCP726D (SUTURE) ×2 IMPLANT
SYR BULB IRRIG 60ML STRL (SYRINGE) ×4 IMPLANT
SYR CONTROL 10ML LL (SYRINGE) ×2 IMPLANT
TAPE CLOTH SURG 4X10 WHT LF (GAUZE/BANDAGES/DRESSINGS) ×2 IMPLANT
TOWEL GREEN STERILE (TOWEL DISPOSABLE) ×4 IMPLANT
TOWEL GREEN STERILE FF (TOWEL DISPOSABLE) ×2 IMPLANT
TUBE CONNECTING 12X1/4 (SUCTIONS) ×2 IMPLANT
WATER STERILE IRR 1000ML POUR (IV SOLUTION) ×2 IMPLANT
YANKAUER SUCT BULB TIP NO VENT (SUCTIONS) ×2 IMPLANT

## 2021-02-18 NOTE — H&P (Signed)
PREOPERATIVE H&P  Chief Complaint: Left low back pain  HPI: Janice Wright is a 35 y.o. female who presents with ongoing pain in the low back on the left side.  The patient's workup and history is highly suggestive of left SI dysfunction.  Patient has failed multiple forms of conservative care and continues to have pain (see office notes for additional details regarding the patient's full course of treatment)  Past Medical History:  Diagnosis Date  . Anxiety   . Arthritis    back  . Chronic lower back pain   . Constipation due to opioid therapy   . Depression 2014   hx; "resolved"  . Fatty liver   . Hyperlipidemia   . Polycystic ovarian syndrome   . PONV (postoperative nausea and vomiting)   . Radiculopathy   . Tachycardia   . Type II diabetes mellitus (HCC) dx'd 2014   Past Surgical History:  Procedure Laterality Date  . BACK SURGERY     x 2  . BACK SURGERY    . CESAREAN SECTION  2011  . CESAREAN SECTION    . CHOLECYSTECTOMY    . KNEE ARTHROSCOPY Left 2007  . LAPAROSCOPIC CHOLECYSTECTOMY  2006  . LUMBAR DISC SURGERY  08/2008; 12/2010  . POSTERIOR LUMBAR FUSION  10/2012  . POSTERIOR LUMBAR FUSION  04/01/2015   revision/notes 04/01/2015  . SPINAL CORD STIMULATOR REMOVAL    . TUBAL LIGATION    . WISDOM TOOTH EXTRACTION     "2 @ a time"   Social History   Socioeconomic History  . Marital status: Married    Spouse name: Not on file  . Number of children: Not on file  . Years of education: Not on file  . Highest education level: Not on file  Occupational History  . Not on file  Tobacco Use  . Smoking status: Current Every Day Smoker    Packs/day: 0.50  . Smokeless tobacco: Never Used  Vaping Use  . Vaping Use: Never used  Substance and Sexual Activity  . Alcohol use: Not Currently  . Drug use: No  . Sexual activity: Yes    Birth control/protection: None  Other Topics Concern  . Not on file  Social History Narrative   ** Merged History Encounter **        Social Determinants of Health   Financial Resource Strain: Not on file  Food Insecurity: Not on file  Transportation Needs: Not on file  Physical Activity: Not on file  Stress: Not on file  Social Connections: Not on file   No family history on file. No Known Allergies Prior to Admission medications   Medication Sig Start Date End Date Taking? Authorizing Provider  dapagliflozin propanediol (FARXIGA) 10 MG TABS tablet Take 10 mg by mouth daily before breakfast. 11/05/19  Yes Merlyn Albert, MD  glipiZIDE (GLUCOTROL) 5 MG tablet TAKE 2 TABLETS (10 MG TOTAL) BY MOUTH 2 (TWO) TIMES DAILY BEFORE A MEAL. 11/29/19  Yes Merlyn Albert, MD  methocarbamol (ROBAXIN) 500 MG tablet Take 500 mg by mouth at bedtime.   Yes [provider]  pregabalin (LYRICA) 200 MG capsule Take 200 mg by mouth 3 (three) times daily. 11/28/18 02/09/21 Yes [provider]  Semaglutide, 1 MG/DOSE, 4 MG/3ML SOPN Inject 1 mg into the skin every 7 (seven) days.   Yes [provider]  cefUROXime (CEFTIN) 500 MG tablet Take 1 tablet (500 mg total) by mouth 2 (two) times daily with  a meal. Patient not taking: No sig reported 09/12/20   Dione Booze, MD  metFORMIN (GLUCOPHAGE) 500 MG tablet Take 2 tablets (1,000 mg total) by mouth 2 (two) times daily with a meal. Patient not taking: No sig reported 05/10/19   Merlyn Albert, MD  metroNIDAZOLE (FLAGYL) 500 MG tablet Take 1 tablet (500 mg total) by mouth 2 (two) times daily. Patient not taking: No sig reported 09/12/20   Dione Booze, MD  traMADol (ULTRAM) 50 MG tablet Take 50 mg by mouth every 6 (six) hours as needed.    [provider]  traZODone (DESYREL) 50 MG tablet Take 50 mg by mouth at bedtime.    [provider]     All other systems have been reviewed and were otherwise negative with the exception of those mentioned in the HPI and as above.  Physical Exam: There were no vitals filed for this visit.  There is  no height or weight on file to calculate BMI.  General: Alert, no acute distress Cardiovascular: No pedal edema Respiratory: No cyanosis, no use of accessory musculature Skin: No lesions in the area of chief complaint Neurologic: Sensation intact distally Psychiatric: Patient is competent for consent with normal mood and affect Lymphatic: No axillary or cervical lymphadenopathy   Assessment/Plan: LEFT SACROILIAC JOINT DYSFUNCTION Plan for Procedure(s): LEFT [facility] JOINT FUSION   Jackelyn Hoehn, MD 02/18/2021 6:40 AM

## 2021-02-18 NOTE — Transfer of Care (Signed)
Immediate Anesthesia Transfer of Care Note  Patient: Janice Wright  Procedure(s) Performed: LEFT SCROILIAC JOINT FUSION (Left )  Patient Location: PACU  Anesthesia Type:General  Level of Consciousness: awake and alert   Airway & Oxygen Therapy: Patient Spontanous Breathing and Patient connected to nasal cannula oxygen  Post-op Assessment: Report given to RN and Post -op Vital signs reviewed and stable  Post vital signs: Reviewed and stable  Last Vitals:  Vitals Value Taken Time  BP    Temp    Pulse    Resp    SpO2      Last Pain:  Vitals:   02/18/21 0819  TempSrc:   PainSc: 6       Patients Stated Pain Goal: 5 (02/18/21 0819)  Complications: No complications documented.

## 2021-02-18 NOTE — Progress Notes (Signed)
Orthopedic Tech Progress Note Patient Details:  Janice Wright August 05, 1986 [id number]  Ortho Devices Type of Ortho Device: Crutches Ortho Device/Splint Interventions: Ordered,Adjustment   Post Interventions Patient Tolerated: Other (comment) Instructions Provided: Care of device,Adjustment of device,Poper ambulation with device   Janice Wright 02/18/2021, 3:48 PM

## 2021-02-18 NOTE — Anesthesia Procedure Notes (Signed)
Procedure Name: Intubation Date/Time: 02/18/2021 11:35 AM Performed by: Inda Coke, CRNA Pre-anesthesia Checklist: Patient identified, Emergency Drugs available, Suction available and Patient being monitored Patient Re-evaluated:Patient Re-evaluated prior to induction Oxygen Delivery Method: Circle System Utilized Preoxygenation: Pre-oxygenation with 100% oxygen Induction Type: IV induction Ventilation: Mask ventilation without difficulty Laryngoscope Size: Mac and 3 Grade View: Grade I Tube type: Oral Tube size: 7.0 mm Number of attempts: 1 Airway Equipment and Method: Stylet and Oral airway Placement Confirmation: ETT inserted through vocal cords under direct vision,  positive ETCO2 and breath sounds checked- equal and bilateral Secured at: 22 cm Tube secured with: Tape Dental Injury: Teeth and Oropharynx as per pre-operative assessment

## 2021-02-18 NOTE — Progress Notes (Signed)
Hypoglycemic Event  CBG: 76  Treatment: D50 25 mL (12.5 gm) (verbal order, per Dr. Mal Amabile)  Symptoms: None  Follow-up CBG: Time:0900 CBG Result:142  Possible Reasons for Event: Unknown  Comments/MD notified: Dr. Allegra Lai, RN

## 2021-02-18 NOTE — Op Note (Signed)
NAME:  Mckenze Slone. [facility]        MEDICAL RECORD NO.:  [id number]   DATE OF BIRTH:  27-Sep-1986   DATE OF PROCEDURE:  02/18/2021                               OPERATIVE REPORT     PREOPERATIVE DIAGNOSES: 1.  Left sacroiliac joint dysfunction   POSTOPERATIVE DIAGNOSES: 1.  Left sacroiliac joint dysfunction   PROCEDURES: Minimally invasive left sacroiliac joint fusion   SURGEON:  [name], MD.   ASSISTANTJason Coop, PA-C.   ANESTHESIA:  General endotracheal anesthesia.   COMPLICATIONS:  None.   DISPOSITION:  Stable.   ESTIMATED BLOOD LOSS:  Minimal.   INDICATIONS FOR SURGERY:  Briefly, Chania is a pleasant 35 year old female, who did present to me with ongoing severe pain at the left side of her low back.  Her work-up was diagnostic for left sacroiliac joint dysfunction.  She did fail appropriate conservative treatment measures, and as such, we did discuss proceeding with the surgery noted above.  The patient was made fully aware of the risks and recovery period associated with surgery, and she did wish to proceed.   OPERATIVE DETAILS:  On 02/18/2021, the patient was brought to surgery and general endotracheal anesthesia was administered.  The patient was placed prone on a flat Jackson bed, withdrawal was placed beneath the patient's chest and hips.  The region of the left buttock was prepped and draped in the usual sterile fashion.  A timeout procedure was performed.  Fluoroscopy was brought into the field.  I was able to ensure adequate lateral, inlet, and outlet radiographs.  At this point, a 3 cm incision was made in line with the posterior border of the sacrum on the left.  3 guidewires were advanced across the sacroiliac joint on the left side.  Fluoroscopy was liberally used while advancing the guidewires in order to ensure a safe trajectory of the guidewires..  I then drilled and broached over the guidewires.  At this point, 7 mm implants were advanced across the  sacroiliac joints from superiorly to inferiorly, the length of the implants were 55 mm, 50 mm, and 45 mm.  I was very pleased with the resting position of the implants on lateral, inlet, and outlet fluoroscopy.  The guidewires were then removed.  I was very pleased with the final lateral, inlet, and outlet radiographs.  At this point, the wound was copiously irrigated and closed in layers, using #1 Vicryl, followed by 2-0 Vicryl, followed by 4-0 Monocryl.  All instrument counts were correct at the termination of the procedure.   Of note, Jason Coop was my assistant throughout surgery, and did aid in retraction, suctioning, and closure from start to finish.     [name], MD

## 2021-02-18 NOTE — Anesthesia Preprocedure Evaluation (Addendum)
Anesthesia Evaluation  Patient identified by MRN, date of birth, ID band Patient awake    Reviewed: Allergy & Precautions, NPO status , Patient's Chart, lab work & pertinent test results  History of Anesthesia Complications (+) PONV and history of anesthetic complications  Airway Mallampati: II  TM Distance: >3 FB Neck ROM: Full    Dental  (+) Dental Advisory Given, Teeth Intact   Pulmonary Current Smoker and Patient abstained from smoking.,    Pulmonary exam normal        Cardiovascular negative cardio ROS Normal cardiovascular exam     Neuro/Psych PSYCHIATRIC DISORDERS Anxiety Depression negative neurological ROS     GI/Hepatic negative GI ROS, Neg liver ROS,   Endo/Other  diabetes, Type 2, Oral Hypoglycemic Agents Obesity   Renal/GU negative Renal ROS     Musculoskeletal  (+) Arthritis ,   Abdominal   Peds  Hematology negative hematology ROS (+)   Anesthesia Other Findings Chronic pain syndrome Covid test negative   Reproductive/Obstetrics  PCOS                             Anesthesia Physical Anesthesia Plan  ASA: II  Anesthesia Plan: General   Post-op Pain Management:    Induction: Intravenous  PONV Risk Score and Plan: 4 or greater and Treatment may vary due to age or medical condition, Ondansetron, Aprepitant, Midazolam and Scopolamine patch - Pre-op  Airway Management Planned: Oral ETT  Additional Equipment: None  Intra-op Plan:   Post-operative Plan: Extubation in OR  Informed Consent: I have reviewed the patients History and Physical, chart, labs and discussed the procedure including the risks, benefits and alternatives for the proposed anesthesia with the patient or authorized representative who has indicated his/her understanding and acceptance.     Dental advisory given  Plan Discussed with: CRNA and Anesthesiologist  Anesthesia Plan Comments:         Anesthesia Quick Evaluation

## 2021-02-18 NOTE — Anesthesia Postprocedure Evaluation (Signed)
Anesthesia Post Note  Patient: Janice Wright  Procedure(s) Performed: LEFT SCROILIAC JOINT FUSION (Left )     Patient location during evaluation: PACU Anesthesia Type: General Level of consciousness: awake and alert Pain management: pain level controlled Vital Signs Assessment: post-procedure vital signs reviewed and stable Respiratory status: spontaneous breathing, nonlabored ventilation and respiratory function stable Cardiovascular status: blood pressure returned to baseline and stable Postop Assessment: no apparent nausea or vomiting Anesthetic complications: no   No complications documented.  Last Vitals:  Vitals:   02/18/21 1337 02/18/21 1352  BP: (!) 105/56 114/64  Pulse: 96 97  Resp: 16 15  Temp:  (!) 36.1 C  SpO2: 95% 94%    Last Pain:  Vitals:   02/18/21 1337  TempSrc:   PainSc: 6                  Beryle Lathe

## 2021-02-19 ENCOUNTER — Encounter (HOSPITAL_COMMUNITY): Payer: Self-pay | Admitting: Orthopedic Surgery

## 2022-03-14 ENCOUNTER — Other Ambulatory Visit: Payer: Self-pay | Admitting: Orthopedic Surgery

## 2022-03-22 NOTE — Progress Notes (Signed)
Surgical Instructions    Your procedure is scheduled on Thursday June 1st.  Report to [facility] Main Entrance "A" at 5:30 A.M., then check in with the Admitting office.  Call this number if you have problems the morning of surgery:  [phone]   If you have any questions prior to your surgery date call [phone]: Open Monday-Friday 8am-4pm    Remember:  Do not eat after midnight the night before your surgery  You may drink clear liquids until 4:30 am the morning of your surgery.   Clear liquids allowed are: Water, Non-Citrus Juices (without pulp), Carbonated Beverages, Clear Tea, Black Coffee ONLY (NO MILK, CREAM OR POWDERED CREAMER of any kind), and Gatorade    Take these medicines the morning of surgery with A SIP OF WATER: pregabalin (LYRICA) 300 MG capsule traMADol (ULTRAM) 50 MG tablet  IF NEEDED  acetaminophen (TYLENOL) 500 MG tablet  As of today, STOP taking any Aspirin (unless otherwise instructed by your surgeon) Aleve, Naproxen, Ibuprofen, Motrin, Advil, Goody's, BC's, all herbal medications, fish oil, and all vitamins.   WHAT DO I DO ABOUT MY DIABETES MEDICATION?   The morning of surgery, Do not take your Farxiga, Glucotrol, Metformin oral diabetes medicines (pills).  The day before surgery do NOT take your [place].  The day before surgery you may take your Glucotrol morning dose but do NOT take any evening dose of Glucotrol.  The day of surgery, do not take other diabetes injectables, including Byetta (exenatide), Bydureon (exenatide ER), Victoza (liraglutide), or Trulicity (dulaglutide).   HOW TO MANAGE YOUR DIABETES BEFORE AND AFTER SURGERY  Why is it important to control my blood sugar before and after surgery? Improving blood sugar levels before and after surgery helps healing and can limit problems. A way of improving blood sugar control is eating a healthy diet by:  Eating less sugar and carbohydrates  Increasing activity/exercise  Talking with  your doctor about reaching your blood sugar goals High blood sugars (greater than 180 mg/dL) can raise your risk of infections and slow your recovery, so you will need to focus on controlling your diabetes during the weeks before surgery. Make sure that the doctor who takes care of your diabetes knows about your planned surgery including the date and location.  How do I manage my blood sugar before surgery? Check your blood sugar at least 4 times a day, starting 2 days before surgery, to make sure that the level is not too high or low.  Check your blood sugar the morning of your surgery when you wake up and every 2 hours until you get to the Short Stay unit.  If your blood sugar is less than 70 mg/dL, you will need to treat for low blood sugar: Do not take insulin. Treat a low blood sugar (less than 70 mg/dL) with  cup of clear juice (cranberry or apple), 4 glucose tablets, OR glucose gel. Recheck blood sugar in 15 minutes after treatment (to make sure it is greater than 70 mg/dL). If your blood sugar is not greater than 70 mg/dL on recheck, call [phone] for further instructions. Report your blood sugar to the short stay nurse when you get to Short Stay.  If you are admitted to the hospital after surgery: Your blood sugar will be checked by the staff and you will probably be given insulin after surgery (instead of oral diabetes medicines) to make sure you have good blood sugar levels. The goal for blood sugar control after surgery is 80-180  mg/dL.           Do not wear jewelry or makeup Do not wear lotions, powders, perfumes, or deodorant. Do not shave 48 hours prior to surgery.   Do not bring valuables to the hospital. Do not wear nail polish, gel polish, artificial nails, or any other type of covering on natural nails (fingers and toes) If you have artificial nails or gel coating that need to be removed by a nail salon, please have this removed prior to surgery. Artificial nails or  gel coating may interfere with anesthesia's ability to adequately monitor your vital signs.  [facility] is not responsible for any belongings or valuables. .   Do NOT Smoke (Tobacco/Vaping)  24 hours prior to your procedure  If you use a CPAP at night, you may bring your mask for your overnight stay.   Contacts, glasses, hearing aids, dentures or partials may not be worn into surgery, please bring cases for these belongings   For patients admitted to the hospital, discharge time will be determined by your treatment team.   Patients discharged the day of surgery will not be allowed to drive home, and someone needs to stay with them for 24 hours.   SURGICAL WAITING ROOM VISITATION Patients having surgery or a procedure in a hospital may have two support people. Children under the age of 72 must have an adult with them who is not the patient. They may stay in the waiting area during the procedure and may switch out with other visitors. If the patient needs to stay at the hospital during part of their recovery, the visitor guidelines for inpatient rooms apply.  Please refer to the [facility] website for the visitor guidelines for Inpatients (after your surgery is over and you are in a regular room).       Special instructions:    Oral Hygiene is also important to reduce your risk of infection.  Remember - BRUSH YOUR TEETH THE MORNING OF SURGERY WITH YOUR REGULAR TOOTHPASTE   [facility]- Preparing For Surgery  Before surgery, you can play an important role. Because skin is not sterile, your skin needs to be as free of germs as possible. You can reduce the number of germs on your skin by washing with CHG (chlorahexidine gluconate) Soap before surgery.  CHG is an antiseptic cleaner which kills germs and bonds with the skin to continue killing germs even after washing.     Please do not use if you have an allergy to CHG or antibacterial soaps. If your skin becomes reddened/irritated  stop using the CHG.  Do not shave (including legs and underarms) for at least 48 hours prior to first CHG shower. It is OK to shave your face.  Please follow these instructions carefully.     Shower the NIGHT BEFORE SURGERY and the MORNING OF SURGERY with CHG Soap.   If you chose to wash your hair, wash your hair first as usual with your normal shampoo. After you shampoo, rinse your hair and body thoroughly to remove the shampoo.  Then [employer] and genitals (private parts) with your normal soap and rinse thoroughly to remove soap.  After that Use CHG Soap as you would any other liquid soap. You can apply CHG directly to the skin and wash gently with a scrungie or a clean washcloth.   Apply the CHG Soap to your body ONLY FROM THE NECK DOWN.  Do not use on open wounds or open sores. Avoid contact  with your eyes, ears, mouth and genitals (private parts). Wash Face and genitals (private parts)  with your normal soap.   Wash thoroughly, paying special attention to the area where your surgery will be performed.  Thoroughly rinse your body with warm water from the neck down.  DO NOT shower/wash with your normal soap after using and rinsing off the CHG Soap.  Pat yourself dry with a CLEAN TOWEL.  Wear CLEAN PAJAMAS to bed the night before surgery  Place CLEAN SHEETS on your bed the night before your surgery  DO NOT SLEEP WITH PETS.   Day of Surgery:  Take a shower with CHG soap. Wear Clean/Comfortable clothing the morning of surgery Do not apply any deodorants/lotions.   Remember to brush your teeth WITH YOUR REGULAR TOOTHPASTE.    If you received a COVID test during your pre-op visit, it is requested that you wear a mask when out in public, stay away from anyone that may not be feeling well, and notify your surgeon if you develop symptoms. If you have been in contact with anyone that has tested positive in the last 10 days, please notify your surgeon.    Please read over the  following fact sheets that you were given.

## 2022-03-23 ENCOUNTER — Encounter (HOSPITAL_COMMUNITY): Payer: Self-pay

## 2022-03-23 ENCOUNTER — Other Ambulatory Visit: Payer: Self-pay

## 2022-03-23 ENCOUNTER — Encounter (HOSPITAL_COMMUNITY)
Admission: RE | Admit: 2022-03-23 | Discharge: 2022-03-23 | Disposition: A | Discharge status: Home / Self Care | Payer: Medicare Other | Source: Ambulatory Visit | Attending: Orthopedic Surgery | Admitting: Orthopedic Surgery

## 2022-03-23 VITALS — BP 106/67 | HR 96 | Temp 98.4°F | Resp 18 | Ht 64.0 in | Wt 195.7 lb

## 2022-03-23 DIAGNOSIS — Z01818 Encounter for other preprocedural examination: Secondary | ICD-10-CM | POA: Insufficient documentation

## 2022-03-23 DIAGNOSIS — E119 Type 2 diabetes mellitus without complications: Secondary | ICD-10-CM | POA: Diagnosis not present

## 2022-03-23 LAB — CBC
HCT: 53.4 % — ABNORMAL HIGH (ref 36.0–46.0)
Hemoglobin: 17.2 g/dL — ABNORMAL HIGH (ref 12.0–15.0)
MCH: 30.2 pg (ref 26.0–34.0)
MCHC: 32.2 g/dL (ref 30.0–36.0)
MCV: 93.8 fL (ref 80.0–100.0)
Platelets: 331 10*3/uL (ref 150–400)
RBC: 5.69 MIL/uL — ABNORMAL HIGH (ref 3.87–5.11)
RDW: 13 % (ref 11.5–15.5)
WBC: 10.6 10*3/uL — ABNORMAL HIGH (ref 4.0–10.5)
nRBC: 0 % (ref 0.0–0.2)

## 2022-03-23 LAB — SURGICAL PCR SCREEN
MRSA, PCR: NEGATIVE
Staphylococcus aureus: NEGATIVE

## 2022-03-23 LAB — TYPE AND SCREEN
ABO/RH(D): O POS
Antibody Screen: NEGATIVE

## 2022-03-23 LAB — HEMOGLOBIN A1C
Hgb A1c MFr Bld: 5.5 % (ref 4.8–5.6)
Mean Plasma Glucose: 111.15 mg/dL

## 2022-03-23 LAB — GLUCOSE, CAPILLARY: Glucose-Capillary: 147 mg/dL — ABNORMAL HIGH (ref 70–99)

## 2022-03-23 NOTE — Progress Notes (Signed)
PCP - Sharlyne Cai, NP Cardiologist - denies  PPM/ICD - denies   Chest x-ray - 09/11/20 EKG - 03/23/22 at PAT Stress Test - denies ECHO - 09/28/17 Cardiac Cath - denies  Sleep Study - denies  DM- Type 2 Fasting Blood Sugar - 90-100 Checks Blood Sugar "a few times per week"  ASA/Blood Thinner Instructions: n/a   ERAS Protcol - yes PRE-SURGERY  G2- given at PAT  COVID TEST- n/a   Anesthesia review: no  Patient denies shortness of breath, fever, cough and chest pain at PAT appointment   All instructions explained to the patient, with a verbal understanding of the material. Patient agrees to go over the instructions while at home for a better understanding. Patient also instructed to notify surgeon of any contact with COVID+ person or if she develops any symptoms. The opportunity to ask questions was provided.

## 2022-03-31 ENCOUNTER — Other Ambulatory Visit: Payer: Self-pay

## 2022-03-31 ENCOUNTER — Inpatient Hospital Stay (HOSPITAL_COMMUNITY): Payer: Medicare Other

## 2022-03-31 ENCOUNTER — Encounter (HOSPITAL_COMMUNITY)
Admission: RE | Disposition: A | Discharge status: Home / Self Care | Payer: Self-pay | Source: Ambulatory Visit | Attending: Orthopedic Surgery

## 2022-03-31 ENCOUNTER — Inpatient Hospital Stay (HOSPITAL_COMMUNITY)
Admission: RE | Admit: 2022-03-31 | Discharge: 2022-04-01 | DRG: 455 | Disposition: A | Discharge status: Home / Self Care | Payer: Medicare Other | Source: Ambulatory Visit | Attending: Orthopedic Surgery | Admitting: Orthopedic Surgery

## 2022-03-31 ENCOUNTER — Encounter (HOSPITAL_COMMUNITY): Payer: Self-pay | Admitting: Orthopedic Surgery

## 2022-03-31 ENCOUNTER — Inpatient Hospital Stay (HOSPITAL_COMMUNITY): Payer: Medicare Other | Admitting: Certified Registered Nurse Anesthetist

## 2022-03-31 DIAGNOSIS — E119 Type 2 diabetes mellitus without complications: Secondary | ICD-10-CM | POA: Diagnosis present

## 2022-03-31 DIAGNOSIS — M5416 Radiculopathy, lumbar region: Secondary | ICD-10-CM | POA: Diagnosis present

## 2022-03-31 DIAGNOSIS — Z79899 Other long term (current) drug therapy: Secondary | ICD-10-CM | POA: Diagnosis not present

## 2022-03-31 DIAGNOSIS — Z981 Arthrodesis status: Secondary | ICD-10-CM

## 2022-03-31 DIAGNOSIS — Z7984 Long term (current) use of oral hypoglycemic drugs: Secondary | ICD-10-CM | POA: Diagnosis not present

## 2022-03-31 DIAGNOSIS — M4316 Spondylolisthesis, lumbar region: Secondary | ICD-10-CM

## 2022-03-31 DIAGNOSIS — M48061 Spinal stenosis, lumbar region without neurogenic claudication: Secondary | ICD-10-CM

## 2022-03-31 DIAGNOSIS — Z01818 Encounter for other preprocedural examination: Secondary | ICD-10-CM

## 2022-03-31 DIAGNOSIS — K760 Fatty (change of) liver, not elsewhere classified: Secondary | ICD-10-CM | POA: Diagnosis present

## 2022-03-31 DIAGNOSIS — E282 Polycystic ovarian syndrome: Secondary | ICD-10-CM | POA: Diagnosis present

## 2022-03-31 DIAGNOSIS — E785 Hyperlipidemia, unspecified: Secondary | ICD-10-CM | POA: Diagnosis present

## 2022-03-31 DIAGNOSIS — M5410 Radiculopathy, site unspecified: Secondary | ICD-10-CM | POA: Diagnosis present

## 2022-03-31 DIAGNOSIS — F17210 Nicotine dependence, cigarettes, uncomplicated: Secondary | ICD-10-CM | POA: Diagnosis present

## 2022-03-31 DIAGNOSIS — M1990 Unspecified osteoarthritis, unspecified site: Secondary | ICD-10-CM | POA: Diagnosis present

## 2022-03-31 HISTORY — PX: ANTERIOR LAT LUMBAR FUSION: SHX1168

## 2022-03-31 LAB — GLUCOSE, CAPILLARY
Glucose-Capillary: 135 mg/dL — ABNORMAL HIGH (ref 70–99)
Glucose-Capillary: 97 mg/dL (ref 70–99)
Glucose-Capillary: 103 mg/dL — ABNORMAL HIGH (ref 70–99)
Glucose-Capillary: 146 mg/dL — ABNORMAL HIGH (ref 70–99)
Glucose-Capillary: 158 mg/dL — ABNORMAL HIGH (ref 70–99)

## 2022-03-31 LAB — BASIC METABOLIC PANEL
Anion gap: 8 (ref 5–15)
BUN: 8 mg/dL (ref 6–20)
CO2: 25 mmol/L (ref 22–32)
Calcium: 9.2 mg/dL (ref 8.9–10.3)
Chloride: 106 mmol/L (ref 98–111)
Creatinine, Ser: 0.64 mg/dL (ref 0.44–1.00)
GFR, Estimated: >60 mL/min (ref >60)
Glucose, Bld: 88 mg/dL (ref 70–99)
Potassium: 4 mmol/L (ref 3.5–5.1)
Sodium: 139 mmol/L (ref 135–145)

## 2022-03-31 LAB — POCT PREGNANCY, URINE: Preg Test, Ur: NEGATIVE

## 2022-03-31 SURGERY — ANTERIOR LATERAL LUMBAR FUSION 1 LEVEL
Anesthesia: General | Laterality: Left

## 2022-03-31 MED ORDER — SCOPOLAMINE 1 MG/3DAYS TD PT72
1.0000 | MEDICATED_PATCH | TRANSDERMAL | Status: DC
  Administered 2022-03-31: 1.5 mg via TRANSDERMAL
  Filled 2022-03-31: qty 1

## 2022-03-31 MED ORDER — ONDANSETRON HCL 4 MG/2ML IJ SOLN
INTRAMUSCULAR | Status: DC | PRN
  Administered 2022-03-31: 4 mg via INTRAVENOUS

## 2022-03-31 MED ORDER — OXYCODONE HCL 5 MG PO TABS: 5.0000 mg | ORAL_TABLET | Freq: Once | ORAL | Status: DC | PRN

## 2022-03-31 MED ORDER — BUPIVACAINE-EPINEPHRINE (PF) 0.25% -1:200000 IJ SOLN
INTRAMUSCULAR | Status: AC
  Filled 2022-03-31: qty 30

## 2022-03-31 MED ORDER — DOCUSATE SODIUM 100 MG PO CAPS
100.0000 mg | ORAL_CAPSULE | Freq: Two times a day (BID) | ORAL | Status: DC
  Administered 2022-03-31 – 2022-04-01 (×2): 100 mg via ORAL
  Filled 2022-03-31 (×2): qty 1

## 2022-03-31 MED ORDER — TRAMADOL HCL 50 MG PO TABS: 50.0000 mg | ORAL_TABLET | Freq: Four times a day (QID) | ORAL | Status: DC | PRN

## 2022-03-31 MED ORDER — ZOLPIDEM TARTRATE 5 MG PO TABS: 5.0000 mg | ORAL_TABLET | Freq: Every evening | ORAL | Status: DC | PRN

## 2022-03-31 MED ORDER — HYDROMORPHONE HCL 1 MG/ML IJ SOLN
INTRAMUSCULAR | Status: AC
  Filled 2022-03-31: qty 1

## 2022-03-31 MED ORDER — HYDROXYZINE HCL 25 MG PO TABS
50.0000 mg | ORAL_TABLET | Freq: Every day | ORAL | Status: DC
  Administered 2022-03-31: 50 mg via ORAL
  Filled 2022-03-31: qty 2

## 2022-03-31 MED ORDER — PHENYLEPHRINE 80 MCG/ML (10ML) SYRINGE FOR IV PUSH (FOR BLOOD PRESSURE SUPPORT)
PREFILLED_SYRINGE | INTRAVENOUS | Status: DC | PRN
  Administered 2022-03-31: 80 ug via INTRAVENOUS
  Administered 2022-03-31 (×2): 40 ug via INTRAVENOUS
  Administered 2022-03-31: 80 ug via INTRAVENOUS

## 2022-03-31 MED ORDER — BISACODYL 5 MG PO TBEC: 5.0000 mg | DELAYED_RELEASE_TABLET | Freq: Every day | ORAL | Status: DC | PRN

## 2022-03-31 MED ORDER — POVIDONE-IODINE 7.5 % EX SOLN: Freq: Once | CUTANEOUS | Status: DC

## 2022-03-31 MED ORDER — PROPOFOL 10 MG/ML IV BOLUS
INTRAVENOUS | Status: DC | PRN
  Administered 2022-03-31: 150 mg via INTRAVENOUS

## 2022-03-31 MED ORDER — ACETAMINOPHEN 650 MG RE SUPP: 650.0000 mg | RECTAL | Status: DC | PRN

## 2022-03-31 MED ORDER — ONDANSETRON HCL 4 MG/2ML IJ SOLN
INTRAMUSCULAR | Status: AC
  Filled 2022-03-31: qty 2

## 2022-03-31 MED ORDER — DEXAMETHASONE SODIUM PHOSPHATE 10 MG/ML IJ SOLN
INTRAMUSCULAR | Status: DC | PRN
  Administered 2022-03-31: 8 mg via INTRAVENOUS

## 2022-03-31 MED ORDER — METHOCARBAMOL 1000 MG/10ML IJ SOLN
500.0000 mg | Freq: Four times a day (QID) | INTRAVENOUS | Status: DC | PRN
  Filled 2022-03-31: qty 5

## 2022-03-31 MED ORDER — MIDAZOLAM HCL 2 MG/2ML IJ SOLN
INTRAMUSCULAR | Status: AC
  Filled 2022-03-31: qty 2

## 2022-03-31 MED ORDER — PROPOFOL 10 MG/ML IV BOLUS
INTRAVENOUS | Status: AC
  Filled 2022-03-31: qty 20

## 2022-03-31 MED ORDER — FLEET ENEMA 7-19 GM/118ML RE ENEM: 1.0000 | ENEMA | Freq: Once | RECTAL | Status: DC | PRN

## 2022-03-31 MED ORDER — SENNOSIDES-DOCUSATE SODIUM 8.6-50 MG PO TABS: 1.0000 | ORAL_TABLET | Freq: Every evening | ORAL | Status: DC | PRN

## 2022-03-31 MED ORDER — ONDANSETRON HCL 4 MG/2ML IJ SOLN: 4.0000 mg | Freq: Four times a day (QID) | INTRAMUSCULAR | Status: DC | PRN

## 2022-03-31 MED ORDER — BUPIVACAINE-EPINEPHRINE 0.25% -1:200000 IJ SOLN
INTRAMUSCULAR | Status: DC | PRN
  Administered 2022-03-31: 7 mL

## 2022-03-31 MED ORDER — LIDOCAINE 2% (20 MG/ML) 5 ML SYRINGE
INTRAMUSCULAR | Status: AC
  Filled 2022-03-31: qty 5

## 2022-03-31 MED ORDER — SODIUM CHLORIDE 0.9% FLUSH: 3.0000 mL | INTRAVENOUS | Status: DC | PRN

## 2022-03-31 MED ORDER — OXYCODONE HCL 5 MG/5ML PO SOLN: 5.0000 mg | Freq: Once | ORAL | Status: DC | PRN

## 2022-03-31 MED ORDER — ACETAMINOPHEN 325 MG PO TABS: 650.0000 mg | ORAL_TABLET | ORAL | Status: DC | PRN

## 2022-03-31 MED ORDER — 0.9 % SODIUM CHLORIDE (POUR BTL) OPTIME
TOPICAL | Status: DC | PRN
  Administered 2022-03-31 (×2): 1000 mL

## 2022-03-31 MED ORDER — HYDROMORPHONE HCL 1 MG/ML IJ SOLN
0.2500 mg | INTRAMUSCULAR | Status: DC | PRN
  Administered 2022-03-31 (×2): 0.5 mg via INTRAVENOUS

## 2022-03-31 MED ORDER — CEFAZOLIN SODIUM-DEXTROSE 2-4 GM/100ML-% IV SOLN
2.0000 g | Freq: Three times a day (TID) | INTRAVENOUS | Status: AC
  Administered 2022-03-31 (×2): 2 g via INTRAVENOUS
  Filled 2022-03-31 (×2): qty 100

## 2022-03-31 MED ORDER — LACTATED RINGERS IV SOLN: INTRAVENOUS | Status: DC

## 2022-03-31 MED ORDER — SUCCINYLCHOLINE CHLORIDE 200 MG/10ML IV SOSY
PREFILLED_SYRINGE | INTRAVENOUS | Status: AC
  Filled 2022-03-31: qty 10

## 2022-03-31 MED ORDER — HYDROXYZINE HCL 50 MG/ML IM SOLN
50.0000 mg | Freq: Four times a day (QID) | INTRAMUSCULAR | Status: DC | PRN
  Administered 2022-03-31: 50 mg via INTRAMUSCULAR
  Filled 2022-03-31: qty 1

## 2022-03-31 MED ORDER — PROPOFOL 1000 MG/100ML IV EMUL
INTRAVENOUS | Status: AC
  Filled 2022-03-31: qty 100

## 2022-03-31 MED ORDER — MORPHINE SULFATE (PF) 2 MG/ML IV SOLN
1.0000 mg | INTRAVENOUS | Status: DC | PRN
  Administered 2022-03-31 (×3): 2 mg via INTRAVENOUS
  Filled 2022-03-31 (×3): qty 1

## 2022-03-31 MED ORDER — CEFAZOLIN SODIUM-DEXTROSE 2-4 GM/100ML-% IV SOLN
2.0000 g | INTRAVENOUS | Status: AC
  Administered 2022-03-31: 2 g via INTRAVENOUS
  Filled 2022-03-31: qty 100

## 2022-03-31 MED ORDER — FENTANYL CITRATE (PF) 250 MCG/5ML IJ SOLN
INTRAMUSCULAR | Status: DC | PRN
Start: 2022-03-31 — End: 2022-03-31
  Administered 2022-03-31: 100 ug via INTRAVENOUS
  Administered 2022-03-31 (×6): 50 ug via INTRAVENOUS
  Administered 2022-03-31: 100 ug via INTRAVENOUS

## 2022-03-31 MED ORDER — SUCCINYLCHOLINE CHLORIDE 200 MG/10ML IV SOSY
PREFILLED_SYRINGE | INTRAVENOUS | Status: DC | PRN
  Administered 2022-03-31: 140 mg via INTRAVENOUS

## 2022-03-31 MED ORDER — METHOCARBAMOL 500 MG PO TABS
500.0000 mg | ORAL_TABLET | Freq: Four times a day (QID) | ORAL | Status: DC | PRN
  Administered 2022-03-31: 500 mg via ORAL
  Filled 2022-03-31 (×2): qty 1

## 2022-03-31 MED ORDER — PHENOL 1.4 % MT LIQD: 1.0000 | OROMUCOSAL | Status: DC | PRN

## 2022-03-31 MED ORDER — SODIUM CHLORIDE 0.9% FLUSH
3.0000 mL | Freq: Two times a day (BID) | INTRAVENOUS | Status: DC
  Administered 2022-03-31: 3 mL via INTRAVENOUS

## 2022-03-31 MED ORDER — FENTANYL CITRATE (PF) 100 MCG/2ML IJ SOLN
INTRAMUSCULAR | Status: AC
  Filled 2022-03-31: qty 2

## 2022-03-31 MED ORDER — FENTANYL CITRATE (PF) 100 MCG/2ML IJ SOLN
25.0000 ug | INTRAMUSCULAR | Status: DC | PRN
  Administered 2022-03-31: 50 ug via INTRAVENOUS
  Administered 2022-03-31 (×2): 25 ug via INTRAVENOUS
  Administered 2022-03-31: 50 ug via INTRAVENOUS

## 2022-03-31 MED ORDER — FENTANYL CITRATE (PF) 250 MCG/5ML IJ SOLN
INTRAMUSCULAR | Status: AC
  Filled 2022-03-31: qty 5

## 2022-03-31 MED ORDER — MIDAZOLAM HCL 2 MG/2ML IJ SOLN
INTRAMUSCULAR | Status: DC | PRN
  Administered 2022-03-31: 2 mg via INTRAVENOUS

## 2022-03-31 MED ORDER — DAPAGLIFLOZIN PROPANEDIOL 10 MG PO TABS
10.0000 mg | ORAL_TABLET | Freq: Every day | ORAL | Status: DC
  Administered 2022-04-01: 10 mg via ORAL
  Filled 2022-03-31: qty 1

## 2022-03-31 MED ORDER — PROPOFOL 500 MG/50ML IV EMUL
INTRAVENOUS | Status: DC | PRN
  Administered 2022-03-31: 100 ug/kg/min via INTRAVENOUS

## 2022-03-31 MED ORDER — AMISULPRIDE (ANTIEMETIC) 5 MG/2ML IV SOLN
10.0000 mg | Freq: Once | INTRAVENOUS | Status: AC | PRN
  Administered 2022-03-31: 10 mg via INTRAVENOUS

## 2022-03-31 MED ORDER — POTASSIUM CHLORIDE IN NACL 20-0.9 MEQ/L-% IV SOLN: INTRAVENOUS | Status: DC

## 2022-03-31 MED ORDER — MENTHOL 3 MG MT LOZG: 1.0000 | LOZENGE | OROMUCOSAL | Status: DC | PRN

## 2022-03-31 MED ORDER — PHENYLEPHRINE HCL (PRESSORS) 10 MG/ML IV SOLN
INTRAVENOUS | Status: AC
  Filled 2022-03-31: qty 1

## 2022-03-31 MED ORDER — SODIUM CHLORIDE 0.9 % IV SOLN
250.0000 mL | INTRAVENOUS | Status: DC
  Administered 2022-03-31: 250 mL via INTRAVENOUS

## 2022-03-31 MED ORDER — AMISULPRIDE (ANTIEMETIC) 5 MG/2ML IV SOLN
INTRAVENOUS | Status: AC
  Filled 2022-03-31: qty 4

## 2022-03-31 MED ORDER — ACETAMINOPHEN 500 MG PO TABS
1000.0000 mg | ORAL_TABLET | Freq: Once | ORAL | Status: AC
Start: 2022-03-31 — End: 2022-03-31
  Administered 2022-03-31: 1000 mg via ORAL
  Filled 2022-03-31: qty 2

## 2022-03-31 MED ORDER — ALUM & MAG HYDROXIDE-SIMETH 200-200-20 MG/5ML PO SUSP: 30.0000 mL | Freq: Four times a day (QID) | ORAL | Status: DC | PRN

## 2022-03-31 MED ORDER — CYCLOBENZAPRINE HCL 10 MG PO TABS
10.0000 mg | ORAL_TABLET | Freq: Every day | ORAL | Status: DC
  Administered 2022-03-31: 10 mg via ORAL
  Filled 2022-03-31: qty 1

## 2022-03-31 MED ORDER — ONDANSETRON HCL 4 MG/2ML IJ SOLN: 4.0000 mg | Freq: Once | INTRAMUSCULAR | Status: DC | PRN

## 2022-03-31 MED ORDER — PREGABALIN 100 MG PO CAPS
300.0000 mg | ORAL_CAPSULE | Freq: Two times a day (BID) | ORAL | Status: DC
  Administered 2022-03-31 – 2022-04-01 (×2): 300 mg via ORAL
  Filled 2022-03-31 (×2): qty 3

## 2022-03-31 MED ORDER — OXYCODONE-ACETAMINOPHEN 5-325 MG PO TABS
1.0000 | ORAL_TABLET | ORAL | Status: DC | PRN
  Administered 2022-03-31 – 2022-04-01 (×4): 2 via ORAL
  Filled 2022-03-31 (×4): qty 2

## 2022-03-31 MED ORDER — THROMBIN 20000 UNITS EX SOLR
CUTANEOUS | Status: DC | PRN
  Administered 2022-03-31: 20 mL via TOPICAL

## 2022-03-31 MED ORDER — ORAL CARE MOUTH RINSE: 15.0000 mL | Freq: Once | OROMUCOSAL | Status: AC

## 2022-03-31 MED ORDER — THROMBIN 20000 UNITS EX SOLR
CUTANEOUS | Status: AC
  Filled 2022-03-31: qty 20000

## 2022-03-31 MED ORDER — METFORMIN HCL 500 MG PO TABS
1000.0000 mg | ORAL_TABLET | Freq: Two times a day (BID) | ORAL | Status: DC
  Filled 2022-03-31: qty 2

## 2022-03-31 MED ORDER — LIDOCAINE 2% (20 MG/ML) 5 ML SYRINGE
INTRAMUSCULAR | Status: DC | PRN
  Administered 2022-03-31: 60 mg via INTRAVENOUS

## 2022-03-31 MED ORDER — ONDANSETRON HCL 4 MG PO TABS
4.0000 mg | ORAL_TABLET | Freq: Four times a day (QID) | ORAL | Status: DC | PRN
Start: 2022-03-31 — End: 2022-04-01
  Filled 2022-03-31: qty 1

## 2022-03-31 MED ORDER — CHLORHEXIDINE GLUCONATE 0.12 % MT SOLN
15.0000 mL | Freq: Once | OROMUCOSAL | Status: AC
  Administered 2022-03-31: 15 mL via OROMUCOSAL
  Filled 2022-03-31: qty 15

## 2022-03-31 MED ORDER — INSULIN ASPART 100 UNIT/ML IJ SOLN: 0.0000 [IU] | INTRAMUSCULAR | Status: DC | PRN

## 2022-03-31 MED ORDER — PHENYLEPHRINE HCL-NACL 20-0.9 MG/250ML-% IV SOLN
INTRAVENOUS | Status: DC | PRN
  Administered 2022-03-31: 20 ug/min via INTRAVENOUS

## 2022-03-31 SURGICAL SUPPLY — 77 items
BAG COUNTER SPONGE SURGICOUNT (BAG) ×2 IMPLANT
BATTALION LLIF ITRADISCAL SHIM (MISCELLANEOUS) ×2
BENZOIN TINCTURE PRP APPL 2/3 (GAUZE/BANDAGES/DRESSINGS) ×1 IMPLANT
BLADE CLIPPER SURG (BLADE) IMPLANT
BLADE SURG 10 STRL SS (BLADE) ×2 IMPLANT
BONE VIVIGEN FORMABLE 10CC (Bone Implant) ×2 IMPLANT
CLIP SPRING STIM LLIF SAFEOP (CLIP) ×1 IMPLANT
COVER BACK TABLE 80X110 HD (DRAPES) ×2 IMPLANT
COVER SURGICAL LIGHT HANDLE (MISCELLANEOUS) ×2 IMPLANT
DILATOR INSULATED LLIF 8-13-18 (NEUROSURGERY SUPPLIES) ×1 IMPLANT
DRAPE C-ARM 42X72 X-RAY (DRAPES) ×2 IMPLANT
DRAPE C-ARMOR (DRAPES) ×2 IMPLANT
DRAPE POUCH INSTRU U-SHP 10X18 (DRAPES) ×2 IMPLANT
DRAPE SURG 17X23 STRL (DRAPES) ×8 IMPLANT
DURAPREP 26ML APPLICATOR (WOUND CARE) ×2 IMPLANT
ELECT BLADE 6.5 EXT (BLADE) ×2 IMPLANT
ELECT CAUTERY BLADE 6.4 (BLADE) ×2 IMPLANT
ELECT KIT SAFEOP SSEP/SURF (KITS) ×2
ELECT REM PT RETURN 9FT ADLT (ELECTROSURGICAL) ×2
ELECTRODE KT SAFEOP SSEP/SURF (KITS) IMPLANT
ELECTRODE REM PT RTRN 9FT ADLT (ELECTROSURGICAL) ×1 IMPLANT
GAUZE 4X4 16PLY ~~LOC~~+RFID DBL (SPONGE) ×3 IMPLANT
GAUZE SPONGE 4X4 12PLY STRL (GAUZE/BANDAGES/DRESSINGS) ×1 IMPLANT
GLOVE BIO SURGEON STRL SZ7 (GLOVE) ×4 IMPLANT
GLOVE BIO SURGEON STRL SZ8 (GLOVE) ×2 IMPLANT
GLOVE BIOGEL PI IND STRL 7.0 (GLOVE) ×1 IMPLANT
GLOVE BIOGEL PI IND STRL 7.5 (GLOVE) IMPLANT
GLOVE BIOGEL PI IND STRL 8 (GLOVE) ×1 IMPLANT
GLOVE BIOGEL PI INDICATOR 7.0 (GLOVE) ×3
GLOVE BIOGEL PI INDICATOR 7.5 (GLOVE) ×1
GLOVE BIOGEL PI INDICATOR 8 (GLOVE) ×1
GLOVE ECLIPSE 7.0 STRL STRAW (GLOVE) ×2 IMPLANT
GLOVE SURG ENC MOIS LTX SZ6.5 (GLOVE) ×4 IMPLANT
GOWN STRL REUS W/ TWL LRG LVL3 (GOWN DISPOSABLE) ×1 IMPLANT
GOWN STRL REUS W/ TWL XL LVL3 (GOWN DISPOSABLE) ×2 IMPLANT
GOWN STRL REUS W/TWL LRG LVL3 (GOWN DISPOSABLE) ×1
GOWN STRL REUS W/TWL XL LVL3 (GOWN DISPOSABLE) ×3
GRAFT BNE MATRIX VG FRMBL L 10 (Bone Implant) IMPLANT
GUIDEWIRE LLIF TT 320 (WIRE) ×1 IMPLANT
IV CATH 14GX2 1/4 (CATHETERS) ×2 IMPLANT
KIT BASIN OR (CUSTOM PROCEDURE TRAY) ×2 IMPLANT
KIT TURNOVER KIT B (KITS) ×2 IMPLANT
KNIFE ANNULOTOMY (BLADE) ×1 IMPLANT
MARKER SKIN DUAL TIP RULER LAB (MISCELLANEOUS) ×2 IMPLANT
NDL HYPO 25GX1X1/2 BEV (NEEDLE) ×1 IMPLANT
NDL SPNL 18GX3.5 QUINCKE PK (NEEDLE) ×1 IMPLANT
NEEDLE HYPO 25GX1X1/2 BEV (NEEDLE) ×2 IMPLANT
NEEDLE SPNL 18GX3.5 QUINCKE PK (NEEDLE) ×2 IMPLANT
NS IRRIG 1000ML POUR BTL (IV SOLUTION) ×4 IMPLANT
PACK LAMINECTOMY ORTHO (CUSTOM PROCEDURE TRAY) ×2 IMPLANT
PACK UNIVERSAL I (CUSTOM PROCEDURE TRAY) ×2 IMPLANT
PAD ARMBOARD 7.5X6 YLW CONV (MISCELLANEOUS) ×4 IMPLANT
PLATE LIF AMP 2/SCRW 06/15 (Plate) ×1 IMPLANT
PROBE BALL TIP LLIF SAFEOP (NEUROSURGERY SUPPLIES) ×1 IMPLANT
SCREW LIF AMP 5.5X50 2-PK (Screw) ×1 IMPLANT
SHIM ITRADISCAL BATTALION LLIF (MISCELLANEOUS) IMPLANT
SPACER TRANSCEND 10X18X50 0D (Spacer) ×1 IMPLANT
SPONGE INTESTINAL PEANUT (DISPOSABLE) ×4 IMPLANT
SPONGE SURGIFOAM ABS GEL 100 (HEMOSTASIS) IMPLANT
SPONGE T-LAP 4X18 ~~LOC~~+RFID (SPONGE) ×2 IMPLANT
STAPLER VISISTAT 35W (STAPLE) ×2 IMPLANT
STRIP CLOSURE SKIN 1/2X4 (GAUZE/BANDAGES/DRESSINGS) ×1 IMPLANT
SURGIFLO W/THROMBIN 8M KIT (HEMOSTASIS) IMPLANT
SUT MNCRL AB 4-0 PS2 18 (SUTURE) ×2 IMPLANT
SUT VIC AB 0 CT1 18XCR BRD 8 (SUTURE) ×1 IMPLANT
SUT VIC AB 0 CT1 8-18 (SUTURE) ×1
SUT VIC AB 1 CT1 18XCR BRD 8 (SUTURE) ×1 IMPLANT
SUT VIC AB 1 CT1 8-18 (SUTURE) ×1
SUT VIC AB 2-0 CT2 18 VCP726D (SUTURE) ×2 IMPLANT
SYR BULB IRRIG 60ML STRL (SYRINGE) ×2 IMPLANT
SYR CONTROL 10ML LL (SYRINGE) ×2 IMPLANT
TAPE PAPER 3X10 WHT MICROPORE (GAUZE/BANDAGES/DRESSINGS) ×1 IMPLANT
TOWEL GREEN STERILE (TOWEL DISPOSABLE) ×2 IMPLANT
TOWEL GREEN STERILE FF (TOWEL DISPOSABLE) ×2 IMPLANT
TRAY FOLEY MTR SLVR 16FR STAT (SET/KITS/TRAYS/PACK) ×2 IMPLANT
WATER STERILE IRR 1000ML POUR (IV SOLUTION) ×2 IMPLANT
YANKAUER SUCT BULB TIP NO VENT (SUCTIONS) ×2 IMPLANT

## 2022-03-31 NOTE — H&P (Signed)
PREOPERATIVE H&P  Chief Complaint: Bilateral leg pain  HPI: Janice Wright is a 36 y.o. female who presents with ongoing pain in the legs  MRI reveals severe stenosis above her fusion, at L3/4  Patient has failed multiple forms of conservative care and continues to have pain (see office notes for additional details regarding the patient's full course of treatment)  Past Medical History:  Diagnosis Date   Anxiety    Arthritis    back   Chronic lower back pain    Constipation due to opioid therapy    Depression 2014   hx; "resolved"   Fatty liver    Hyperlipidemia    Polycystic ovarian syndrome    PONV (postoperative nausea and vomiting)    Radiculopathy    Tachycardia    Type II diabetes mellitus ([place]) dx'd 2014   Past Surgical History:  Procedure Laterality Date   BACK SURGERY     x 2   CESAREAN SECTION  2011   CESAREAN SECTION  2019   KNEE ARTHROSCOPY Left 2007   LAPAROSCOPIC CHOLECYSTECTOMY  2006   LUMBAR [place] SURGERY  08/2008; 12/2010   POSTERIOR LUMBAR FUSION  10/2012   POSTERIOR LUMBAR FUSION  04/01/2015   revision/notes 04/01/2015   SACROILIAC JOINT FUSION Left 02/18/2021   Procedure: LEFT [place];  Surgeon: Phylliss Bob, MD;  Location: [place];  Service: Orthopedics;  Laterality: Left;   SPINAL CORD STIMULATOR REMOVAL     TUBAL LIGATION     WISDOM TOOTH EXTRACTION     "2 @ a time"   Social History   Socioeconomic History   Marital status: Married    Spouse name: Not on file   Number of children: 3   Years of education: Not on file   Highest education level: Not on file  Occupational History   Not on file  Tobacco Use   Smoking status: Every Day    Packs/day: 0.50    Types: Cigarettes   Smokeless tobacco: Never  Vaping Use   Vaping Use: Never used  Substance and Sexual Activity   Alcohol use: Not Currently   Drug use: No   Sexual activity: Yes    Birth control/protection: None  Other Topics Concern   Not on file   Social History Narrative   ** Merged History Encounter **       Social Determinants of Health   Financial Resource Strain: Not on file  Food Insecurity: Not on file  Transportation Needs: Not on file  Physical Activity: Not on file  Stress: Not on file  Social Connections: Not on file   No family history on file. Allergies  Allergen Reactions   Norco [Hydrocodone-Acetaminophen] Nausea Only   Prior to Admission medications   Medication Sig Start Date End Date Taking? Authorizing Provider  acetaminophen (TYLENOL) 500 MG tablet Take 1,000 mg by mouth every 6 (six) hours as needed for moderate pain.   Yes [provider]  cyclobenzaprine (FLEXERIL) 5 MG tablet Take 10 mg by mouth at bedtime.   Yes [provider]  dapagliflozin propanediol (FARXIGA) 10 MG TABS tablet Take 10 mg by mouth daily before breakfast. 11/05/19  Yes Mikey Kirschner, MD  hydrOXYzine (ATARAX) 25 MG tablet Take 50 mg by mouth at bedtime. 02/17/22  Yes [provider]  OZEMPIC, 2 MG/DOSE, 8 MG/3ML SOPN Inject 2 mg into the skin once a week. 02/28/22  Yes [provider]  pregabalin (LYRICA) 300 MG capsule  Take 300 mg by mouth 2 (two) times daily. 11/28/18 03/21/22 Yes [provider]  traMADol (ULTRAM) 50 MG tablet Take 50 mg by mouth 4 (four) times daily.   Yes [provider]  cefUROXime (CEFTIN) 500 MG tablet Take 1 tablet (500 mg total) by mouth 2 (two) times daily with a meal. Patient not taking: No sig reported [id number]   Delora Fuel, MD  glipiZIDE (GLUCOTROL) 5 MG tablet TAKE 2 TABLETS (10 MG TOTAL) BY MOUTH 2 (TWO) TIMES DAILY BEFORE A MEAL. 11/29/19   Mikey Kirschner, MD  HYDROcodone-acetaminophen (NORCO) 7.5-325 MG tablet Take 1 tablet by mouth every 6 (six) hours as needed for moderate pain. Patient not taking: Reported on 03/21/2022 02/18/21   Justice Britain, PA-C  metFORMIN (GLUCOPHAGE) 500 MG tablet Take 2 tablets (1,000 mg total) by mouth 2 (two)  times daily with a meal. Patient not taking: No sig reported 05/10/19   Mikey Kirschner, MD  methocarbamol (ROBAXIN) 500 MG tablet Take 1 tablet (500 mg total) by mouth every 6 (six) hours as needed for muscle spasms. Patient not taking: Reported on 03/21/2022 02/18/21   Justice Britain, PA-C  metroNIDAZOLE (FLAGYL) 500 MG tablet Take 1 tablet (500 mg total) by mouth 2 (two) times daily. Patient not taking: No sig reported [id number]   Delora Fuel, MD     All other systems have been reviewed and were otherwise negative with the exception of those mentioned in the HPI and as above.  Physical Exam: There were no vitals filed for this visit.  There is no height or weight on file to calculate BMI.  General: Alert, no acute distress Cardiovascular: No pedal edema Respiratory: No cyanosis, no use of accessory musculature Skin: No lesions in the area of chief complaint Neurologic: Sensation intact distally Psychiatric: Patient is competent for consent with normal mood and affect Lymphatic: No axillary or cervical lymphadenopathy   Assessment/Plan: Advancement of L3-4 degenerative disc disease, with development of severe spinal stenosis and instability at L3-4, the level above her fusion Plan for Procedure(s): LEFT-SIDED LUMBAR 3- LUMBAR 4 LATERAL INTERBODY FUSION WITH INSTRUMENTATION AND ALLOGRAFT   Norva Karvonen, MD 03/31/2022 6:34 AM

## 2022-03-31 NOTE — Anesthesia Preprocedure Evaluation (Signed)
Anesthesia Evaluation  Patient identified by MRN, date of birth, ID band Patient awake    Reviewed: Allergy & Precautions, NPO status , Patient's Chart, lab work & pertinent test results  History of Anesthesia Complications (+) PONV and history of anesthetic complications  Airway Mallampati: III  TM Distance: >3 FB Neck ROM: Full    Dental no notable dental hx.    Pulmonary neg pulmonary ROS, Current Smoker and Patient abstained from smoking.,    Pulmonary exam normal breath sounds clear to auscultation       Cardiovascular negative cardio ROS Normal cardiovascular exam Rhythm:Regular Rate:Normal     Neuro/Psych PSYCHIATRIC DISORDERS Anxiety Depression  Neuromuscular disease    GI/Hepatic negative GI ROS, Neg liver ROS,   Endo/Other  diabetesobesity  Renal/GU Renal disease  negative genitourinary   Musculoskeletal  (+) Arthritis , Osteoarthritis,    Abdominal   Peds negative pediatric ROS (+)  Hematology negative hematology ROS (+)   Anesthesia Other Findings   Reproductive/Obstetrics negative OB ROS                            Anesthesia Physical Anesthesia Plan  ASA: 2  Anesthesia Plan: General   Post-op Pain Management: Tylenol PO (pre-op)* and Minimal or no pain anticipated   Induction: Intravenous  PONV Risk Score and Plan: 3 and Treatment may vary due to age or medical condition, Midazolam, Scopolamine patch - Pre-op, Ondansetron and Dexamethasone  Airway Management Planned: Oral ETT  Additional Equipment: None  Intra-op Plan:   Post-operative Plan: Extubation in OR  Informed Consent: I have reviewed the patients History and Physical, chart, labs and discussed the procedure including the risks, benefits and alternatives for the proposed anesthesia with the patient or authorized representative who has indicated his/her understanding and acceptance.     Dental advisory  given  Plan Discussed with: CRNA, Anesthesiologist and Surgeon  Anesthesia Plan Comments:         Anesthesia Quick Evaluation

## 2022-03-31 NOTE — Progress Notes (Signed)
I just called and spoke with Janice Wright. She says she's doing well, with a "different" pain that she believes is surgical pain, and that she has been walking and that her legs feel better. She reported having some nausea, which improved with medication. She sounded very pleasant and comfortable.

## 2022-03-31 NOTE — Op Note (Signed)
PATIENT NAME: ILONA COLLEY   MEDICAL RECORD NO.:   [id number]    DATE OF BIRTH: June 12, 1986   DATE OF PROCEDURE: 03/31/2022                                OPERATIVE REPORT   PREOPERATIVE DIAGNOSES: 1.  L3-4 spinal stenosis 2.  Bilateral lumbar radiculopathy 3.  S/p previous fusion spanning L4-S1 4.  L3-4 spondylolisthesis   POSTOPERATIVE DIAGNOSES: 1.  L3-4 spinal stenosis 2.  Bilateral lumbar radiculopathy 3.  S/p previous fusion spanning L4-S1 4.  L3-4 spondylolisthesis   PROCEDURE:  1.  Left-sided lateral interbody fusion, L3/4  via direct lateral retroperitoneal approach. 2.  Insertion of interbody device x1 (10 x 18 mm x 50 mm Alphatec intervertebral spacer). 3.  Placement of anterior instrumentation, L3/4 4.  Use of morselized allograft -- ViviGen.   5.  Intraoperative use of fluoroscopy.   SURGEON:  [name], MD   ASSISTANT:  Jason Coop PA-C.   ANESTHESIA:  General endotracheal anesthesia.   COMPLICATIONS:  None.   DISPOSITION:  Stable.   ESTIMATED BLOOD LOSS:  Minimal.   INDICATIONS:  Briefly, Ms. Poe is a very pleasant 36 year old female who is status post multiple lumbar procedures.  Of note, she is status post a fusion spanning L4-S1.  She went on to have adjacent segment degeneration, with instability and stenosis identified at L3-4.  She did fail appropriate treatment measures, and given her ongoing bilateral leg pain, we did elect to proceed with a lateral interbody fusion at L3-4, with instrumentation.  She did understand that we can also elect to proceed with a posterior fusion with instrumentation, which would provide additional stability, however, her posterior skin on examination was attenuated, and I did feel that foregoing a posterior procedure did have significant advantages, such as minimizing the risk for skin breakdown posteriorly, and minimizing the risk of infection, with a less painful recovery.  We did therefore elect to proceed with  a lateral procedure at L3-4, with an approach from the left side. The patient did wish to proceed, after a full understanding of the risks and benefits of surgery.   DESCRIPTION OF PROCEDURE:  On 03/31/2022, the patient was brought to surgery and general endotracheal anesthesia was administered.  The patient was placed in the lateral decubitus position, with the left side up.  Neurologic monitoring leads were placed by the monitoring technician.  The patient's torso and lower extremities were secured to the bed.  The patient's hips and knees were flexed in order to lessen the tension on the psoas musculature.  The left flank was then prepped and draped in the usual sterile fashion.  The bed was flexed, in order to optimize exposure to the L3/4 intervertebral space.  After a timeout procedure was performed, a left-sided transverse incision was made over the left flank overlying the L3/4 intervertebral space.  The retroperitoneal space was encountered, after dissection through the oblique musculature.  The peritoneum was bluntly swept anteriorly, and the psoas was readily identified.  I did use a series of dilators to dock over the L3/4 intervertebral space.  I did use neurologic monitoring while placing the  dilators, in order to ensure that there were no neurologic structures in the immediate vicinity of the dilators.  The lumbar plexus was noted to be posterior.  A self-retaining retractor was placed, and was attached to a rigid arm.  The retractor was  very gently dilated and a shim was placed into the L3/4 intervertebral space.  I then used a knife to perform an annulotomy at the lateral aspect of the L3/4 intervertebral space.  I then used a series of curettes and pituitary rongeurs in order to perform a thorough and complete L3/4 intervertebral diskectomy.  The contralateral annulus was released.  I then placed a series of intervertebral spacer trials, and I did feel that a 10 x 18 mm x 50 mm spacer would be  the most appropriate fit.  The appropriate spacer was then packed with ViviGen and tamped into position.  I was very pleased with the final resting position of the intervertebral spacer.  Excellent height restoration was noted. At this point, an anterior lumbar plate was placed over the lateral aspect of the L3 and L4 vertebral  bodies.  I then used an awl to prepare the trajectory of the L3 and L4 vertebral body screws.  A 50 mm screw was placed into the L3 vertebral body, and a 50 mm screw was placed into the L4 vertebral body.  I was very pleased with the final AP and lateral fluoroscopic images and the excellent restoration of disk height identified on both AP and lateral images.  At this point, the wound was copiously irrigated.  The fascia, internal, and external oblique musculature was closed using #1 Vicryl.  The subcutaneous layer was closed using 2-0 Vicryl and the skin was closed using 4-0 Monocryl.     Of note, I did use neurologic monitoring throughout the entire surgery, and there was no sustained abnormal EMG activity noted throughout the entire surgery. All instrument counts were correct at the termination of the procedure.   Of note, Jason Coop was my assistant throughout surgery, and did aid in retraction, placement of the hardware, suctioning, and closure.   [name], MD

## 2022-03-31 NOTE — Anesthesia Procedure Notes (Signed)
Procedure Name: Intubation Date/Time: 03/31/2022 11:34 AM Performed by: Carolan Clines, CRNA Pre-anesthesia Checklist: Patient identified, Emergency Drugs available, Suction available and Patient being monitored Patient Re-evaluated:Patient Re-evaluated prior to induction Oxygen Delivery Method: Circle System Utilized Preoxygenation: Pre-oxygenation with 100% oxygen Induction Type: IV induction Ventilation: Mask ventilation without difficulty Laryngoscope Size: Mac and 3 Grade View: Grade I Tube type: Oral Number of attempts: 1 Airway Equipment and Method: Stylet Placement Confirmation: ETT inserted through vocal cords under direct vision, positive ETCO2 and breath sounds checked- equal and bilateral Secured at: 22 cm Tube secured with: Tape Dental Injury: Teeth and Oropharynx as per pre-operative assessment

## 2022-03-31 NOTE — Transfer of Care (Signed)
Immediate Anesthesia Transfer of Care Note  Patient: Janice Wright  Procedure(s) Performed: LEFT-SIDED LUMBAR THREE- LUMBAR FOUR LATERAL INTERBODY FUSION WITH INSTRUMENTATION AND ALLOGRAFT (Left)  Patient Location: PACU  Anesthesia Type:General  Level of Consciousness: patient cooperative and responds to stimulation  Airway & Oxygen Therapy: Patient Spontanous Breathing and Patient connected to nasal cannula oxygen  Post-op Assessment: Report given to RN and Post -op Vital signs reviewed and stable  Post vital signs: Reviewed and stable  Last Vitals:  Vitals Value Taken Time  BP 95/60 03/31/22 1405  Temp    Pulse 92 03/31/22 1409  Resp 16 03/31/22 1409  SpO2 96 % 03/31/22 1409  Vitals shown include unvalidated device data.  Last Pain:  Vitals:   03/31/22 0854  TempSrc:   PainSc: 8       Patients Stated Pain Goal: 2 (03/31/22 0854)  Complications: No notable events documented.

## 2022-04-01 LAB — GLUCOSE, CAPILLARY: Glucose-Capillary: 111 mg/dL — ABNORMAL HIGH (ref 70–99)

## 2022-04-01 MED ORDER — CYCLOBENZAPRINE HCL 5 MG PO TABS: 5.0000 mg | ORAL_TABLET | Freq: Three times a day (TID) | ORAL | 0 refills | Status: AC | PRN

## 2022-04-01 MED ORDER — OXYCODONE-ACETAMINOPHEN 5-325 MG PO TABS: 1.0000 | ORAL_TABLET | ORAL | 0 refills | Status: AC | PRN

## 2022-04-01 NOTE — Progress Notes (Signed)
PT Cancellation Note and Discharge  Patient Details Name: Janice Wright MRN: [id number] DOB: [birth year]   Cancelled Treatment:    Reason Eval/Treat Not Completed: PT screened, no needs identified, will sign off. Discussed pt case with OT and observed pt ambulating mod I in the hallway with family member present. Practiced 3 stairs with pt and she was able to demonstrate without difficulty. No further acute PT needs at this time and pt anticipates d/c this morning.    Marylynn Pearson 04/01/2022, 9:19 AM  Conni Slipper, PT, DPT Acute Rehabilitation Services Secure Chat Preferred Office: [phone]

## 2022-04-01 NOTE — Progress Notes (Signed)
Patient alert and oriented, voiding adequately, skin clean, dry and intact without evidence of skin break down, or symptoms of complications - no redness or edema noted, only slight tenderness at site.  Patient states pain is manageable at time of discharge. Patient has an appointment with MD in 2 weeks 

## 2022-04-01 NOTE — Progress Notes (Signed)
    Patient doing well postop day 1 from her lateral L3-L4 fusion.  She states that her preoperative leg pain is resolved.  She does have a bit of a different pain in her left anterior hip and thigh particularly with hip flexion consistent with psoas discomfort from the surgical approach.  This is not unexpected.  She has been tolerating the pain medication well and appears comfortable.  She states that she slept okay.  Physical therapy is in the room to get her started and she is eager to progress home.  She denies any nausea or vomiting and has been eating and drinking okay with normal bowel and bladder function.   Physical Exam: Vitals:   04/01/22 0509 04/01/22 0727  BP: (!) 104/59 101/68  Pulse: 89 92  Resp: 18 16  Temp: 98.6 F (37 C) 98.5 F (36.9 C)  SpO2: 94% 96%    Dressing in place, CDI, SHE IS LAYING IN BED COMFORTABLY.  sHE DOES HAVE SOME MILD PAIN WITH RESISTED HIP FLEXION.  SHE OTHERWISE APPEARS VERY COMFORTABLE. NVI  POD #1 s/p left lateral L3-L4 fusion with resolved preoperative pain and not unexpected left psoas discomfort from approach, doing well  - up with PT/OT, encourage ambulation - Percocet for pain, cyclobenzaprine for muscle spasms - likely d/c home today with f/u in 2 weeks

## 2022-04-01 NOTE — Evaluation (Addendum)
Occupational Therapy Evaluation Patient Details Name: Janice Wright MRN: [id number] DOB: April 22, 1986 Today's Date: 04/01/2022   History of Present Illness 36 yo female presenting with ongoing leg pain. 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 S/p L4-S1 fusion on 6/1. PMH including anxiety, arthritis, DM type ll, TKA, and multiple lumbar fusions.   Clinical Impression   PTA patient lived at home with husband, and 2 children (ages 66 and 36).  She reports being independent with ADLs/IADLs except assistance provided with socks/shoes when L LE pain is severe. Patient currently performs at Supervision for ADLs and functional mobility; exception of LB dressing with LLE pain post sx.  Provided education on back precautions, donning/doffing TLSO spinal brace, and compensatory techniques for ADLs and functional transfers. Patient reports family and friends will provide assistance with ADLs and transportation.  No further acute OT services are required at this time.  Recommend patient d/c home with family and friends for assistance.     Recommendations for follow up therapy are one component of a multi-disciplinary discharge planning process, led by the attending physician.  Recommendations may be updated based on patient status, additional functional criteria and insurance authorization.   Follow Up Recommendations  No OT follow up    Assistance Recommended at Discharge Intermittent Supervision/Assistance  Patient can return home with the following A little help with bathing/dressing/bathroom;Assistance with cooking/housework;Assist for transportation;A little help with walking and/or transfers    Functional Status Assessment  Patient has had a recent decline in their functional status and demonstrates the ability to make significant improvements in function in a reasonable and predictable amount of time.  Equipment Recommendations       Recommendations for Other Services       Precautions /  Restrictions Precautions Precautions: Back Precaution Booklet Issued: Yes (comment) Required Braces or Orthoses: Spinal Brace Spinal Brace: Thoracolumbosacral orthotic Restrictions Weight Bearing Restrictions: No      Mobility Bed Mobility Overal bed mobility: Modified Independent             General bed mobility comments: educated pt on log roll tehcnique. Cues to adherence.    Transfers Overall transfer level: Needs assistance Equipment used: None Transfers: Sit to/from Stand Sit to Stand: Supervision                  Balance                                           ADL either performed or assessed with clinical judgement   ADL Overall ADL's : Needs assistance/impaired Eating/Feeding: Set up;Sitting   Grooming: Set up;Sitting   Upper Body Bathing: Supervision/ safety;Set up;Sitting   Lower Body Bathing: Supervison/ safety;Sit to/from stand   Upper Body Dressing : Supervision/safety;Set up;Sitting Upper Body Dressing Details (indicate cue type and reason): don brace Lower Body Dressing: Supervision/safety;Sit to/from stand;With caregiver independent assisting Lower Body Dressing Details (indicate cue type and reason): Pt with limited ROM of LLE due to pain. ABle to perform figure four of RLE. Educating on donning LLE first and use of reacher for limited ROM. Pt verbalized understanding and has used reachers in the past. Also has experience using sock aide. Husband agreeable to assist. Toilet Transfer: Supervision/safety;Ambulation (simualted in room)     Toileting - Clothing Manipulation Details (indicate cue type and reason): Educated on toilet hugiene with bending knees   Tub/Shower Transfer Details (indicate  cue type and reason): Educated on tub transfer tehcniques. Pt verbalized understanding Functional mobility during ADLs: Supervision/safety General ADL Comments: Pt performign at Supervision level.     Vision Baseline  Vision/History: 1 Wears glasses Ability to See in Adequate Light: 0 Adequate       Perception     Praxis      Pertinent Vitals/Pain Pain Assessment Pain Assessment: Faces Faces Pain Scale: Hurts little more Pain Location: Left lateral hip/leg Pain Intervention(s): Monitored during session, Premedicated before session     Hand Dominance Right   Extremity/Trunk Assessment         Cervical / Trunk Assessment Cervical / Trunk Assessment: Back Surgery   Communication Communication Communication: No difficulties   Cognition Arousal/Alertness: Awake/alert Behavior During Therapy: WFL for tasks assessed/performed Overall Cognitive Status: Within Functional Limits for tasks assessed                                       General Comments       Exercises     Shoulder Instructions      Home Living Family/patient expects to be discharged to:: Private residence Living Arrangements: Spouse/significant other;Children Available Help at Discharge: Friend(s);Family Type of Home: House Home Access: Stairs to enter Secretary/administrator of Steps: 3 Entrance Stairs-Rails: Left Home Layout: One level     Bathroom Shower/Tub: Chief Strategy Officer: Standard Bathroom Accessibility: Yes      DME: Paediatric nurse, RW (2 wheel), AE (reacher, sock aide)        Prior Functioning/Environment Prior Level of Function : Independent/Modified Independent               ADLs Comments: Patient reports that she asked for husband assistance for LB ADLs when she has "painful days"        OT Problem List:        OT Treatment/Interventions:      OT Goals(Current goals can be found in the care plan section) Acute Rehab OT Goals Patient Stated Goal: "To go home asap" OT Goal Formulation: With patient  OT Frequency:      Co-evaluation              AM-PAC OT "6 Clicks" Daily Activity     Outcome Measure Help from another person eating meals?:  None Help from another person taking care of personal grooming?: A Little Help from another person toileting, which includes using toliet, bedpan, or urinal?: A Little Help from another person bathing (including washing, rinsing, drying)?: None Help from another person to put on and taking off regular upper body clothing?: A Little Help from another person to put on and taking off regular lower body clothing?: A Little 6 Click Score: 20   End of Session Equipment Utilized During Treatment: Back brace  Activity Tolerance: Patient limited by pain Patient left:  (sitting EOB. She stated she would toilet after session ended.   Due to complaints of L lower extremity pain, this clinician advised husband to Supervise her functional ambulation to toilet.)  OT Visit Diagnosis: Muscle weakness (generalized) (M62.81);Pain                Time: 3614-4315 OT Time Calculation (min): 27 min Charges:  OT General Charges $OT Visit: 1 Visit OT Evaluation $OT Eval Low Complexity: 1 Low OT Treatments $Self Care/Home Management : 8-22 mins  Macyn Remmert Yeary-Pickett, OTS 04/01/2022, 9:45 AM

## 2022-04-01 NOTE — Anesthesia Postprocedure Evaluation (Signed)
Anesthesia Post Note  Patient: Janice Wright  Procedure(s) Performed: LEFT-SIDED LUMBAR THREE- LUMBAR FOUR LATERAL INTERBODY FUSION WITH INSTRUMENTATION AND ALLOGRAFT (Left)     Patient location during evaluation: PACU Anesthesia Type: General Level of consciousness: awake Pain management: pain level controlled Vital Signs Assessment: post-procedure vital signs reviewed and stable Respiratory status: spontaneous breathing and respiratory function stable Cardiovascular status: stable Postop Assessment: no apparent nausea or vomiting Anesthetic complications: no   No notable events documented.  Last Vitals:  Vitals:   04/01/22 0509 04/01/22 0727  BP: (!) 104/59 101/68  Pulse: 89 92  Resp: 18 16  Temp: 37 C 36.9 C  SpO2: 94% 96%    Last Pain:  Vitals:   04/01/22 0727  TempSrc: Oral  PainSc:                  Mellody Dance

## 2022-04-04 ENCOUNTER — Encounter (HOSPITAL_COMMUNITY): Payer: Self-pay | Admitting: Orthopedic Surgery

## 2022-04-07 NOTE — Discharge Summary (Signed)
Patient ID: Janice Wright MRN: [id number] DOB/AGE: 36-Feb-1987 36 y.o.  Admit date: 03/31/2022 Discharge date: 04/01/2022  Admission Diagnoses:  Principal Problem:   Radiculopathy   Discharge Diagnoses:  Same  Past Medical History:  Diagnosis Date   Anxiety    Arthritis    back   Chronic lower back pain    Constipation due to opioid therapy    Depression 2014   hx; "resolved"   Fatty liver    Hyperlipidemia    Polycystic ovarian syndrome    PONV (postoperative nausea and vomiting)    Radiculopathy    Tachycardia    Type II diabetes mellitus (HCC) dx'd 2014    Surgeries: Procedure(s): LEFT-SIDED LUMBAR THREE- LUMBAR FOUR LATERAL INTERBODY FUSION WITH INSTRUMENTATION AND ALLOGRAFT on 03/31/2022   Consultants: None  Discharged Condition: Improved  Hospital Course: VIANKA YLITALO is an 36 y.o. female who was admitted 03/31/2022 for operative treatment of Radiculopathy. Patient has severe unremitting pain that affects sleep, daily activities, and work/hobbies. After pre-op clearance the patient was taken to the operating room on 03/31/2022 and underwent  Procedure(s): LEFT-SIDED LUMBAR THREE- LUMBAR FOUR LATERAL INTERBODY FUSION WITH INSTRUMENTATION AND ALLOGRAFT.    Patient was given perioperative antibiotics:  Anti-infectives (From admission, onward)    Start     Dose/Rate Route Frequency Ordered Stop   03/31/22 1545  ceFAZolin (ANCEF) IVPB 2g/100 mL premix        2 g 200 mL/hr over 30 Minutes Intravenous Every 8 hours 03/31/22 1538 03/31/22 2347   03/31/22 0815  ceFAZolin (ANCEF) IVPB 2g/100 mL premix        2 g 200 mL/hr over 30 Minutes Intravenous On call to O.R. 03/31/22 0803 03/31/22 1135        Patient was given sequential compression devices, early ambulation to prevent DVT.  Patient benefited maximally from hospital stay and there were no complications.    Recent vital signs: BP 101/68 (BP Location: Right Arm)   Pulse 92   Temp 98.5 F (36.9 C)  (Oral)   Resp 16   Ht 5\' 5"  (1.651 m)   Wt 88.9 kg   LMP 03/20/2022 (Exact Date) Comment: neg preg test 03/31/22  SpO2 96%   BMI 32.62 kg/m    Discharge Medications:   Allergies as of 04/01/2022       Reactions   Norco [hydrocodone-acetaminophen] Nausea Only        Medication List     STOP taking these medications    HYDROcodone-acetaminophen 7.5-325 MG tablet Commonly known as: Norco   methocarbamol 500 MG tablet Commonly known as: ROBAXIN       TAKE these medications    acetaminophen 500 MG tablet Commonly known as: TYLENOL Take 1,000 mg by mouth every 6 (six) hours as needed for moderate pain.   cyclobenzaprine 5 MG tablet Commonly known as: FLEXERIL Take 1-2 tablets (5-10 mg total) by mouth 3 (three) times daily as needed for muscle spasms. What changed:  how much to take when to take this reasons to take this   Farxiga 10 MG Tabs tablet Generic drug: dapagliflozin propanediol Take 10 mg by mouth daily before breakfast.   glipiZIDE 5 MG tablet Commonly known as: GLUCOTROL TAKE 2 TABLETS (10 MG TOTAL) BY MOUTH 2 (TWO) TIMES DAILY BEFORE A MEAL.   hydrOXYzine 25 MG tablet Commonly known as: ATARAX Take 50 mg by mouth at bedtime.   metFORMIN 500 MG tablet Commonly known as: GLUCOPHAGE Take 2 tablets (1,000  mg total) by mouth 2 (two) times daily with a meal.   oxyCODONE-acetaminophen 5-325 MG tablet Commonly known as: PERCOCET/ROXICET Take 1-2 tablets by mouth every 4 (four) hours as needed for severe pain.   Ozempic (2 MG/DOSE) 8 MG/3ML Sopn Generic drug: Semaglutide (2 MG/DOSE) Inject 2 mg into the skin once a week.   pregabalin 300 MG capsule Commonly known as: LYRICA Take 300 mg by mouth 2 (two) times daily.   traMADol 50 MG tablet Commonly known as: ULTRAM Take 50 mg by mouth 4 (four) times daily.        Diagnostic Studies: DG Lumbar Spine 2-3 Views  Result Date: 03/31/2022 CLINICAL DATA:  L3-L4 XLIF EXAM: LUMBAR SPINE - 2-3 VIEW  COMPARISON:  02/14/2022 FINDINGS: Two intraoperative fluoroscopic images of lumbar fusion changes were provided for interpretation. The submitted images demonstrate lateral fusion, likely at L3-L4. Posterior fusion changes at L4-L5 partially visualized. IMPRESSION: Intraoperative images of the lower lumbar spine as above. Electronically Signed   By: Miachel Roux M.D.   On: 03/31/2022 14:13   DG C-Arm 1-60 Min-No Report  Result Date: 03/31/2022 Fluoroscopy was utilized by the requesting physician.  No radiographic interpretation.   DG C-Arm 1-60 Min-No Report  Result Date: 03/31/2022 Fluoroscopy was utilized by the requesting physician.  No radiographic interpretation.    Disposition: Discharge disposition: 01-Home or Self Care       Discharge Instructions     Discharge patient   Complete by: As directed    Discharge disposition: 01-Home or Self Care   Discharge patient date: 04/01/2022      POD #1 s/p left lateral L3-L4 fusion with resolved preoperative pain and not unexpected left psoas discomfort from approach, doing well   - up with PT/OT, encourage ambulation - Percocet for pain, cyclobenzaprine for muscle spasms -Scripts for pain sent to pharmacy electronically  -D/C instructions sheet printed and in chart -D/C today  -F/U in office 2 weeks   Signed: Lennie Muckle Latrecia Capito 04/07/2022, 9:45 AM

## 2022-04-24 ENCOUNTER — Encounter (HOSPITAL_COMMUNITY): Payer: Self-pay | Admitting: Emergency Medicine

## 2022-04-24 ENCOUNTER — Emergency Department (HOSPITAL_COMMUNITY)
Admission: EM | Admit: 2022-04-24 | Discharge: 2022-04-24 | Disposition: A | Discharge status: Home / Self Care | Payer: Medicare Other | Attending: Emergency Medicine | Admitting: Emergency Medicine

## 2022-04-24 ENCOUNTER — Emergency Department (HOSPITAL_COMMUNITY): Payer: Medicare Other

## 2022-04-24 ENCOUNTER — Other Ambulatory Visit: Payer: Self-pay

## 2022-04-24 DIAGNOSIS — M79604 Pain in right leg: Secondary | ICD-10-CM

## 2022-04-24 DIAGNOSIS — M545 Low back pain, unspecified: Secondary | ICD-10-CM | POA: Insufficient documentation

## 2022-04-24 DIAGNOSIS — D582 Other hemoglobinopathies: Secondary | ICD-10-CM | POA: Diagnosis not present

## 2022-04-24 DIAGNOSIS — Z7984 Long term (current) use of oral hypoglycemic drugs: Secondary | ICD-10-CM | POA: Diagnosis not present

## 2022-04-24 DIAGNOSIS — E119 Type 2 diabetes mellitus without complications: Secondary | ICD-10-CM | POA: Diagnosis not present

## 2022-04-24 LAB — BASIC METABOLIC PANEL
Anion gap: 8 (ref 5–15)
BUN: 8 mg/dL (ref 6–20)
CO2: 23 mmol/L (ref 22–32)
Calcium: 9.3 mg/dL (ref 8.9–10.3)
Chloride: 106 mmol/L (ref 98–111)
Creatinine, Ser: 0.57 mg/dL (ref 0.44–1.00)
GFR, Estimated: >60 mL/min (ref >60)
Glucose, Bld: 87 mg/dL (ref 70–99)
Potassium: 3.7 mmol/L (ref 3.5–5.1)
Sodium: 137 mmol/L (ref 135–145)

## 2022-04-24 LAB — CBC
HCT: 51.4 % — ABNORMAL HIGH (ref 36.0–46.0)
Hemoglobin: 16.9 g/dL — ABNORMAL HIGH (ref 12.0–15.0)
MCH: 30.2 pg (ref 26.0–34.0)
MCHC: 32.9 g/dL (ref 30.0–36.0)
MCV: 91.8 fL (ref 80.0–100.0)
Platelets: 319 10*3/uL (ref 150–400)
RBC: 5.6 MIL/uL — ABNORMAL HIGH (ref 3.87–5.11)
RDW: 13.6 % (ref 11.5–15.5)
WBC: 10.1 10*3/uL (ref 4.0–10.5)
nRBC: 0 % (ref 0.0–0.2)

## 2022-04-24 LAB — PREGNANCY, URINE: Preg Test, Ur: NEGATIVE

## 2022-04-24 MED ORDER — FENTANYL CITRATE (PF) 100 MCG/2ML IJ SOLN
100.0000 ug | Freq: Once | INTRAMUSCULAR | Status: AC
  Administered 2022-04-24: 100 ug via INTRAVENOUS

## 2022-04-24 MED ORDER — FENTANYL CITRATE PF 50 MCG/ML IJ SOSY
50.0000 ug | PREFILLED_SYRINGE | Freq: Once | INTRAMUSCULAR | Status: AC
  Administered 2022-04-24: 50 ug via INTRAVENOUS
  Filled 2022-04-24: qty 1

## 2022-04-24 MED ORDER — DEXAMETHASONE SODIUM PHOSPHATE 10 MG/ML IJ SOLN
10.0000 mg | Freq: Once | INTRAMUSCULAR | Status: AC
  Administered 2022-04-24: 10 mg via INTRAVENOUS
  Filled 2022-04-24: qty 1

## 2022-04-24 MED ORDER — IOHEXOL 350 MG/ML SOLN
100.0000 mL | Freq: Once | INTRAVENOUS | Status: AC | PRN
  Administered 2022-04-24: 100 mL via INTRAVENOUS

## 2022-04-24 MED ORDER — FENTANYL CITRATE (PF) 100 MCG/2ML IJ SOLN
100.0000 ug | Freq: Once | INTRAMUSCULAR | Status: DC
  Filled 2022-04-24: qty 2

## 2022-04-24 MED ORDER — METHYLPREDNISOLONE 4 MG PO TBPK: ORAL_TABLET | ORAL | 0 refills | Status: AC

## 2022-04-24 NOTE — ED Provider Notes (Signed)
[facility] EMERGENCY DEPARTMENT Provider Note   CSN: [id number] Arrival date & time: 04/24/22  1215     History  Chief Complaint  Patient presents with   Leg Pain    Janice Wright is a 36 y.o. female with medical history significant for chronic low back pain, constipation due to opioids, PCOS, radiculopathy, type 2 diabetes, current smoker, recent left-sided L3-L4 lumbar fusion on 03/31/2022 with Dr. Yevette Edwards.  Patient presents to ED for evaluation of right-sided leg pain/numbness that started last night around 10:30 PM.  The patient states that for the last 1 week she has had progressively worsening pain that began around her lateral right hip/low back and then worked its way down her leg localizing in her right foot.  Patient reports that last night at 10:30 PM the foot became numb.  Patient denies any calf pain, chest pain, shortness of breath, fevers, nausea or vomiting.  Patient denies history of blood clots.   Leg Pain Associated symptoms: back pain   Associated symptoms: no fever        Home Medications Prior to Admission medications   Medication Sig Start Date End Date Taking? Authorizing Provider  acetaminophen (TYLENOL) 500 MG tablet Take 1,000 mg by mouth every 6 (six) hours as needed for moderate pain.   Yes [provider]  cyclobenzaprine (FLEXERIL) 5 MG tablet Take 1-2 tablets (5-10 mg total) by mouth 3 (three) times daily as needed for muscle spasms. 04/01/22  Yes McKenzie, Eilene Ghazi, PA-C  dapagliflozin propanediol (FARXIGA) 10 MG TABS tablet Take 10 mg by mouth daily before breakfast. 11/05/19  Yes Merlyn Albert, MD  hydrOXYzine (ATARAX) 25 MG tablet Take 50 mg by mouth at bedtime. 02/17/22  Yes [provider]  methylPREDNISolone (MEDROL DOSEPAK) 4 MG TBPK tablet Day 1: Take 2 tablets before breakfast, 1 tablet after lunch and after dinner, 2 tablets before bedtime Day 2: Take 1 tablet before breakfast, 1 tablet after lunch and after dinner, 2  tablet before bedtime Day 3: Take 1 tablet before breakfast, 1 tablet after lunch, 1 tablet after dinner, 1 tablet at bedtime Day 4: Take 1 tablet before breakfast, 1 tablet after lunch, 1 tablet at bedtime Day 5: Take 1 tablet before breakfast and at bedtime Day 6: Take 1 tablet before breakfast 04/24/22  Yes Octivia Canion F, PA-C  OZEMPIC, 2 MG/DOSE, 8 MG/3ML SOPN Inject 2 mg into the skin once a week. 02/28/22  Yes [provider]  pregabalin (LYRICA) 300 MG capsule Take 300 mg by mouth 2 (two) times daily. 11/28/18 04/24/22 Yes [provider]  traMADol (ULTRAM) 50 MG tablet Take 50 mg by mouth 4 (four) times daily.   Yes [provider]  oxyCODONE-acetaminophen (PERCOCET/ROXICET) 5-325 MG tablet Take 1-2 tablets by mouth every 4 (four) hours as needed for severe pain. Patient not taking: Reported on 04/24/2022 04/01/22   Georga Bora, PA-C      Allergies    Norco [hydrocodone-acetaminophen]    Review of Systems   Review of Systems  Constitutional:  Negative for chills and fever.  Respiratory:  Negative for shortness of breath.   Cardiovascular:  Negative for chest pain.  Gastrointestinal:  Negative for nausea and vomiting.  Musculoskeletal:  Positive for arthralgias, back pain and myalgias.  Neurological:  Positive for weakness and numbness.  All other systems reviewed and are negative.   Physical Exam Updated Vital Signs BP 106/73 (BP Location: Right Arm)   Pulse 82   Temp  98 F (36.7 C)   Resp 17   Ht 5\' 5"  (1.651 m)   Wt 89.4 kg   LMP 03/19/2022   SpO2 97%   BMI 32.78 kg/m  Physical Exam Vitals and nursing note reviewed.  Constitutional:      General: She is not in acute distress.    Appearance: Normal appearance. She is not ill-appearing, toxic-appearing or diaphoretic.  HENT:     Head: Normocephalic and atraumatic.     Nose: Nose normal. No congestion.     Mouth/Throat:     Mouth: Mucous membranes are moist.     Pharynx: Oropharynx  is clear. No oropharyngeal exudate.  Eyes:     Extraocular Movements: Extraocular movements intact.     Conjunctiva/sclera: Conjunctivae normal.     Pupils: Pupils are equal, round, and reactive to light.  Cardiovascular:     Rate and Rhythm: Normal rate and regular rhythm.     Pulses:          Dorsalis pedis pulses are detected w/ Doppler on the right side and detected w/ Doppler on the left side.       Posterior tibial pulses are detected w/ Doppler on the right side and detected w/ Doppler on the left side.  Pulmonary:     Effort: Pulmonary effort is normal.     Breath sounds: Normal breath sounds. No wheezing.  Abdominal:     General: Abdomen is flat. Bowel sounds are normal.     Palpations: Abdomen is soft.     Tenderness: There is no abdominal tenderness.  Musculoskeletal:     Cervical back: Normal range of motion and neck supple. No tenderness.     Right lower leg: No edema.     Left lower leg: No edema.  Skin:    General: Skin is warm and dry.     Capillary Refill: Capillary refill takes less than 2 seconds.  Neurological:     Mental Status: She is alert and oriented to person, place, and time.     GCS: GCS eye subscore is 4. GCS verbal subscore is 5. GCS motor subscore is 6.     Cranial Nerves: Cranial nerves 2-12 are intact. No cranial nerve deficit.     Sensory: Sensation is intact. No sensory deficit.     Motor: Motor function is intact. No weakness.     Coordination: Coordination is intact. Heel to [facility] Test normal.     Deep Tendon Reflexes:     Reflex Scores:      Patellar reflexes are 4+ on the right side and 4+ on the left side.    Comments: Patient reports decreased sensation from mid thigh down to foot on right side however reports increased sensation from hip to mid thigh on right. Patient has 5 out of 5 strength bilaterally against resistance.  Can plantarflex and dorsiflex foot actively. Patient has intact patellar reflex 4+ bilaterally.     ED Results /  Procedures / Treatments   Labs (all labs ordered are listed, but only abnormal results are displayed) Labs Reviewed  CBC - Abnormal; Notable for the following components:      Result Value   RBC 5.60 (*)    Hemoglobin 16.9 (*)    HCT 51.4 (*)    All other components within normal limits  BASIC METABOLIC PANEL  PREGNANCY, URINE    EKG None  Radiology CT ANGIO AO+BIFEM W & OR WO CONTRAST  Result Date: 04/24/2022 CLINICAL DATA:  Right  leg numbness and coldness. Spinal fusion on 03/31/2022. EXAM: CT ANGIOGRAPHY OF ABDOMINAL AORTA WITH ILIOFEMORAL RUNOFF TECHNIQUE: Multidetector CT imaging of the abdomen, pelvis and lower extremities was performed using the standard protocol during bolus administration of intravenous contrast. Multiplanar CT image reconstructions and MIPs were obtained to evaluate the vascular anatomy. RADIATION DOSE REDUCTION: This exam was performed according to the departmental dose-optimization program which includes automated exposure control, adjustment of the mA and/or kV according to patient size and/or use of iterative reconstruction technique. CONTRAST:  OMNIPAQUE IOHEXOL 350 MG/ML SOLN COMPARISON:  CT abdomen and pelvis 09/27/2017. FINDINGS: VASCULAR Aorta: Normal caliber aorta without aneurysm, dissection, vasculitis or significant stenosis. Celiac: Patent without evidence of aneurysm, dissection, vasculitis or significant stenosis. SMA: Patent without evidence of aneurysm, dissection, vasculitis or significant stenosis. Renals: Both renal arteries are patent without evidence of aneurysm, dissection, vasculitis, fibromuscular dysplasia or significant stenosis. IMA: Patent without evidence of aneurysm, dissection, vasculitis or significant stenosis. RIGHT Lower Extremity Inflow: Common, internal and external iliac arteries are patent without evidence of aneurysm, dissection, vasculitis or significant stenosis. Outflow: Common, superficial and profunda femoral arteries  and the popliteal artery are patent without evidence of aneurysm, dissection, vasculitis or significant stenosis. Runoff: Patent three vessel runoff to the ankle. LEFT Lower Extremity Inflow: Common, internal and external iliac arteries are patent without evidence of aneurysm, dissection, vasculitis or significant stenosis. Outflow: Common, superficial and profunda femoral arteries and the popliteal artery are patent without evidence of aneurysm, dissection, vasculitis or significant stenosis. Runoff: Patent three vessel runoff to the ankle. Veins: No obvious venous abnormality within the limitations of this arterial phase study. Review of the MIP images confirms the above findings. NON-VASCULAR Lower chest: There are minimal patchy ground-glass opacities in the bilateral lung bases. There are bands of atelectasis in both lung bases. Hepatobiliary: No focal liver abnormality is seen. Status post cholecystectomy. No biliary dilatation. Pancreas: Unremarkable. No pancreatic ductal dilatation or surrounding inflammatory changes. Spleen: Normal in size without focal abnormality. Adrenals/Urinary Tract: Adrenal glands are unremarkable. Kidneys are normal, without renal calculi, focal lesion, or hydronephrosis. Bladder is unremarkable. Stomach/Bowel: Stomach is within normal limits. Appendix appears normal. No evidence of bowel wall thickening, distention, or inflammatory changes. There are scattered colonic diverticula. Lymphatic: No enlarged lymph nodes identified. Reproductive: Uterus and bilateral adnexa are unremarkable. Other: There is a small fat containing supraumbilical ventral hernia. No abdominopelvic ascites. Musculoskeletal: No acute or significant osseous findings. No significant soft tissue edema or fluid collection identified. IMPRESSION: VASCULAR 1. No acute vascular pathology in the abdomen, pelvis or lower extremities. NON-VASCULAR 1. Patchy ground-glass opacities in the bilateral lung bases may  represent edema or infection. 2. No other acute process in the abdomen, pelvis or lower extremities. 3. Colonic diverticulosis. Electronically Signed   By: Darliss Cheney M.D.   On: 04/24/2022 16:38   CT Lumbar Spine Wo Contrast  Result Date: 04/24/2022 CLINICAL DATA:  Back surgery March 31, 2022. Rule out postop infection. Numbness and tingling right leg EXAM: CT LUMBAR SPINE WITHOUT CONTRAST TECHNIQUE: Multidetector CT imaging of the lumbar spine was performed without intravenous contrast administration. Multiplanar CT image reconstructions were also generated. RADIATION DOSE REDUCTION: This exam was performed according to the departmental dose-optimization program which includes automated exposure control, adjustment of the mA and/or kV according to patient size and/or use of iterative reconstruction technique. COMPARISON:  MRI lumbar spine 03/01/2022 FINDINGS: Segmentation: 5 lumbar vertebra Alignment: 3 mm anterolisthesis L3-4 unchanged. Remaining alignment normal. Vertebrae: Negative for fracture  or mass. No bony erosion or evidence of discitis osteomyelitis. Paraspinal and other soft tissues: No paraspinous soft tissue mass or adenopathy. No paraspinous fluid collection. Disc levels: T12-L1: Mild facet degeneration.  Negative for stenosis L1-2: Mild facet degeneration.  Negative for stenosis L2-3: Mild facet degeneration.  Negative for stenosis L3-4: Interval surgical fusion with left lateral plate and screws and interbody spacer and bone graft. Hardware in good position. Advanced facet hypertrophy. Improvement in spinal stenosis however there remains mild to moderate spinal stenosis and moderate subarticular stenosis bilaterally. L4-5: Bilateral pedicle screw and interbody fusion with solid fusion. Right laminectomy. Spinal canal well decompressed. No foraminal stenosis L5-S1: Bilateral pedicle screw and solid interbody fusion. Posterior decompression. No spinal or foraminal stenosis. IMPRESSION: Negative  for lumbar fracture or evidence of infection. Consider MRI there is further concern of lumbar infection. Recent left lateral fusion at L3-4 without complication. Improvement in spinal stenosis. There is residual mild to moderate spinal stenosis and moderate subarticular stenosis bilaterally due to disc bulging and advanced facet degeneration Solid pedicle screw and interbody fusion L4-5 and L5-S1 without stenosis. Electronically Signed   By: Marlan Palau M.D.   On: 04/24/2022 16:33    Procedures Procedures   Medications Ordered in ED Medications  fentaNYL (SUBLIMAZE) injection 100 mcg (100 mcg Intravenous Given 04/24/22 1426)  iohexol (OMNIPAQUE) 350 MG/ML injection 100 mL (100 mLs Intravenous Contrast Given 04/24/22 1536)  fentaNYL (SUBLIMAZE) injection 50 mcg (50 mcg Intravenous Given 04/24/22 1757)  dexamethasone (DECADRON) injection 10 mg (10 mg Intravenous Given 04/24/22 1757)    ED Course/ Medical Decision Making/ A&P                           Medical Decision Making Amount and/or Complexity of Data Reviewed Radiology: ordered.  Risk Prescription drug management.   36 year old female presents to ED for evaluation of right leg pain.  Please see HPI for further details.  On examination, the patient is afebrile and nontachycardic.  The patient lung sounds are clear bilaterally, not hypoxic on room air.  The patient's abdomen is soft and compressible in all 4 quadrants.  Patient neurological examination shows no focal neurodeficits.  The patient has 5 out of 5 strength bilaterally to lower extremities with ability to plantar dorsiflex foot.  Patient has 4+ patellar reflex.  Patient reports decreased sensation from mid thigh of right leg down to foot however reports increased sensation from mid thigh of right leg up to the hip.  There is no overlying skin change about the entire right lower extremity.  Patient has an appreciable DP pulse with Doppler on right, appreciable PT pulse with  Doppler.  Patient has less than 2-second capillary refill of right foot.  Patient worked up utilizing the following labs and imaging studies interpreted by me personally: - Pregnancy negative - BMP unremarkable - CBC shows increased hemoglobin to 16.9 - CT lumbar spine without contrast shows no signs of postoperative infection, residual mild to moderate spinal stenosis and moderate subarticular stenosis bilaterally due to disc bulging and advanced facet degeneration - CT angiogram of lower extremities did not show any signs of stenosis vascularly  Patient discussed with my attending Dr. Hyacinth Meeker who also performed examination.  Per Dr. Hyacinth Meeker, patient will be placed on 6 days of steroid Dosepak and advised to follow-up with Dr. Yevette Edwards.  Dr. Yevette Edwards has been made aware of the situation through secure chat messaging.  Patient given return precautions and she  voiced understanding.  The patient had all of her questions answered to her satisfaction.  The patient is stable at this time for discharge home   Final Clinical Impression(s) / ED Diagnoses Final diagnoses:  Right leg pain    Rx / DC Orders ED Discharge Orders          Ordered    methylPREDNISolone (MEDROL DOSEPAK) 4 MG TBPK tablet        04/24/22 1735              Al DecantGroce, Yandel Zeiner F, PA-C 04/24/22 1815    Eber HongMiller, Brian, MD 04/27/22 [phone]

## 2022-04-24 NOTE — ED Provider Notes (Signed)
This patient is a 36 year old female presenting with right leg numbness and a feeling of coldness that started last night, has had some progressive symptoms over the last week, has had recent lumbar fusion surgery about 3-1/2 to 4 weeks ago and has had multiple surgical procedures on her back.  She is on chronic hydrocodone, Robaxin, Flexeril and Lyrica.  On exam the patient has intact patellar reflexes, her sensation is slightly decreased below the knee and slightly increased above the knee, she is able to straight leg raise bilaterally, it seems to be somewhat effort driven on the right leg.  She has normal dorsiflexion and plantarflexion at the feet, normal hip flexion and extension at the hips and is able to flex and extend at the knee.  I doubt that she has a severe radiculopathy but certainly does have some sensory deficits that could suggest a nerve impingement.  She will be given Decadron, sent message to primary neurosurgeon or back surgeon Dr. Yevette Edwards.  She understands indications for return and will follow-up outpatient.  Medical screening examination/treatment/procedure(s) were conducted as a shared visit with non-physician practitioner(s) and myself.  I personally evaluated the patient during the encounter.  Clinical Impression:   Final diagnoses:  None         Eber Hong, MD 04/27/22 [phone]

## 2024-04-03 ENCOUNTER — Other Ambulatory Visit: Payer: Self-pay | Admitting: Orthopedic Surgery

## 2024-04-03 DIAGNOSIS — M5416 Radiculopathy, lumbar region: Secondary | ICD-10-CM

## 2024-04-10 ENCOUNTER — Ambulatory Visit
Admission: RE | Admit: 2024-04-10 | Discharge: 2024-04-10 | Disposition: A | Discharge status: Home / Self Care | Source: Ambulatory Visit | Attending: Orthopedic Surgery | Admitting: Orthopedic Surgery

## 2024-04-10 DIAGNOSIS — M5416 Radiculopathy, lumbar region: Secondary | ICD-10-CM

## 2024-04-22 ENCOUNTER — Other Ambulatory Visit: Payer: Self-pay | Admitting: Orthopedic Surgery

## 2024-04-22 DIAGNOSIS — M5416 Radiculopathy, lumbar region: Secondary | ICD-10-CM

## 2024-05-10 ENCOUNTER — Ambulatory Visit
Admission: RE | Admit: 2024-05-10 | Discharge: 2024-05-10 | Disposition: A | Discharge status: Home / Self Care | Source: Ambulatory Visit | Attending: Orthopedic Surgery | Admitting: Orthopedic Surgery

## 2024-05-10 DIAGNOSIS — M5416 Radiculopathy, lumbar region: Secondary | ICD-10-CM

## 2024-05-10 MED ORDER — IOPAMIDOL (ISOVUE-M 200) INJECTION 41%
1.0000 mL | Freq: Once | INTRAMUSCULAR | Status: AC | PRN
  Administered 2024-05-10: 1 mL via INTRATHECAL

## 2024-08-21 ENCOUNTER — Emergency Department (HOSPITAL_COMMUNITY)

## 2024-08-21 ENCOUNTER — Encounter (HOSPITAL_COMMUNITY): Payer: Self-pay

## 2024-08-21 ENCOUNTER — Emergency Department (HOSPITAL_COMMUNITY)
Admission: EM | Admit: 2024-08-21 | Discharge: 2024-08-21 | Disposition: A | Discharge status: Home / Self Care | Attending: Emergency Medicine | Admitting: Emergency Medicine

## 2024-08-21 ENCOUNTER — Other Ambulatory Visit: Payer: Self-pay

## 2024-08-21 DIAGNOSIS — K439 Ventral hernia without obstruction or gangrene: Secondary | ICD-10-CM | POA: Diagnosis not present

## 2024-08-21 DIAGNOSIS — R10A3 Flank pain, bilateral: Secondary | ICD-10-CM | POA: Insufficient documentation

## 2024-08-21 DIAGNOSIS — M545 Low back pain, unspecified: Secondary | ICD-10-CM | POA: Diagnosis not present

## 2024-08-21 DIAGNOSIS — R160 Hepatomegaly, not elsewhere classified: Secondary | ICD-10-CM | POA: Insufficient documentation

## 2024-08-21 DIAGNOSIS — K5900 Constipation, unspecified: Secondary | ICD-10-CM

## 2024-08-21 LAB — URINALYSIS, ROUTINE W REFLEX MICROSCOPIC
Bilirubin Urine: NEGATIVE
Glucose, UA: >=500 mg/dL — AB
Hgb urine dipstick: NEGATIVE
Ketones, ur: NEGATIVE mg/dL
Leukocytes,Ua: NEGATIVE
Nitrite: NEGATIVE
Protein, ur: NEGATIVE mg/dL
Specific Gravity, Urine: 1.025 (ref 1.005–1.030)
pH: 8 (ref 5.0–8.0)

## 2024-08-21 LAB — COMPREHENSIVE METABOLIC PANEL WITH GFR
ALT: 24 U/L (ref 0–44)
AST: 27 U/L (ref 15–41)
Albumin: 4.1 g/dL (ref 3.5–5.0)
Alkaline Phosphatase: 115 U/L (ref 38–126)
Anion gap: 11 (ref 5–15)
BUN: 7 mg/dL (ref 6–20)
CO2: 26 mmol/L (ref 22–32)
Calcium: 9.8 mg/dL (ref 8.9–10.3)
Chloride: 101 mmol/L (ref 98–111)
Creatinine, Ser: 0.6 mg/dL (ref 0.44–1.00)
GFR, Estimated: >60 mL/min (ref >60)
Glucose, Bld: 187 mg/dL — ABNORMAL HIGH (ref 70–99)
Potassium: 4.1 mmol/L (ref 3.5–5.1)
Sodium: 138 mmol/L (ref 135–145)
Total Bilirubin: 0.3 mg/dL (ref 0.0–1.2)
Total Protein: 7.6 g/dL (ref 6.5–8.1)

## 2024-08-21 LAB — CBC WITH DIFFERENTIAL/PLATELET
Abs Immature Granulocytes: 0.03 K/uL (ref 0.00–0.07)
Basophils Absolute: 0.1 K/uL (ref 0.0–0.1)
Basophils Relative: 1 %
Eosinophils Absolute: 0.4 K/uL (ref 0.0–0.5)
Eosinophils Relative: 4 %
HCT: 45.3 % (ref 36.0–46.0)
Hemoglobin: 15.1 g/dL — ABNORMAL HIGH (ref 12.0–15.0)
Immature Granulocytes: 0 %
Lymphocytes Relative: 35 %
Lymphs Abs: 3.4 K/uL (ref 0.7–4.0)
MCH: 30.1 pg (ref 26.0–34.0)
MCHC: 33.3 g/dL (ref 30.0–36.0)
MCV: 90.4 fL (ref 80.0–100.0)
Monocytes Absolute: 0.7 K/uL (ref 0.1–1.0)
Monocytes Relative: 7 %
Neutro Abs: 5.2 K/uL (ref 1.7–7.7)
Neutrophils Relative %: 53 %
Platelets: 302 K/uL (ref 150–400)
RBC: 5.01 MIL/uL (ref 3.87–5.11)
RDW: 13 % (ref 11.5–15.5)
WBC: 9.7 K/uL (ref 4.0–10.5)
nRBC: 0 % (ref 0.0–0.2)

## 2024-08-21 LAB — POC URINE PREG, ED: Preg Test, Ur: NEGATIVE

## 2024-08-21 LAB — LIPASE, BLOOD: Lipase: 26 U/L (ref 11–51)

## 2024-08-21 MED ORDER — MAGNESIUM CITRATE PO SOLN: 1.0000 | Freq: Once | ORAL | 0 refills | Status: AC

## 2024-08-21 NOTE — ED Triage Notes (Signed)
 Pt arrived via POV c/o left flank pain. Pt reports seeing her PCP last week and being started on Macrobid for a UTI for the past 4 days and pain has not been relieved. Pt reports difficulty with urination. Pt also reports having recent back surgery on the 23rd of September.

## 2024-08-21 NOTE — ED Provider Notes (Signed)
 [place] EMERGENCY DEPARTMENT AT [facility] Provider Note   CSN: [id number] Arrival date & time: 08/21/24  1108     Patient presents with: Flank Pain   Janice Wright is a 38 y.o. female.   Patient is a 38 year old female who presents to the emergency department the chief complaint of bilateral flank pain which has been ongoing for approximate the past week.  She was evaluated by her primary care doctor and placed on antibiotics for urinary tract infection.  She notes that she continues to feel as though she has difficulty with urination.  She notes that last night she developed worsening pain in her lower back.  She does note that she had surgery on her back approximately 1 month ago secondary to her SI joint.  Patient notes that she has had no urinary bowel incontinence, saddle paresthesias, gait changes, fever, chills.  She denies any direct abdominal pain.  There has been no nausea, vomiting.   Flank Pain       Prior to Admission medications   Medication Sig Start Date End Date Taking? Authorizing Provider  acetaminophen  (TYLENOL ) 500 MG tablet Take 1,000 mg by mouth every 6 (six) hours as needed for moderate pain.    [provider]  cyclobenzaprine  (FLEXERIL ) 5 MG tablet Take 1-2 tablets (5-10 mg total) by mouth 3 (three) times daily as needed for muscle spasms. 04/01/22   McKenzie, Kayla J, PA-C  dapagliflozin  propanediol (FARXIGA ) 10 MG TABS tablet Take 10 mg by mouth daily before breakfast. 11/05/19   Janice Elsie GORMAN, MD  hydrOXYzine  (ATARAX ) 25 MG tablet Take 50 mg by mouth at bedtime. 02/17/22   [provider]  methylPREDNISolone  (MEDROL  DOSEPAK) 4 MG TBPK tablet Day 1: Take 2 tablets before breakfast, 1 tablet after lunch and after dinner, 2 tablets before bedtime Day 2: Take 1 tablet before breakfast, 1 tablet after lunch and after dinner, 2 tablet before bedtime Day 3: Take 1 tablet before breakfast, 1 tablet after lunch, 1 tablet after  dinner, 1 tablet at bedtime Day 4: Take 1 tablet before breakfast, 1 tablet after lunch, 1 tablet at bedtime Day 5: Take 1 tablet before breakfast and at bedtime Day 6: Take 1 tablet before breakfast 04/24/22   Groce, Takiyah Bohnsack F, PA-C  oxyCODONE -acetaminophen  (PERCOCET/ROXICET) 5-325 MG tablet Take 1-2 tablets by mouth every 4 (four) hours as needed for severe pain. Patient not taking: Reported on 04/24/2022 04/01/22   McKenzie, Kayla J, PA-C  OZEMPIC, 2 MG/DOSE, 8 MG/3ML SOPN Inject 2 mg into the skin once a week. 02/28/22   [provider]  pregabalin  (LYRICA ) 300 MG capsule Take 300 mg by mouth 2 (two) times daily. 11/28/18 04/24/22  [provider]  traMADol  (ULTRAM ) 50 MG tablet Take 50 mg by mouth 4 (four) times daily.    [provider]    Allergies: Norco [hydrocodone -acetaminophen ]    Review of Systems  Genitourinary:  Positive for flank pain.  All other systems reviewed and are negative.   Updated Vital Signs BP 102/71 (BP Location: Right Arm)   Pulse 93   Temp 98.1 F (36.7 C) (Oral)   Resp 18   Ht 5' 5 (1.651 m)   Wt 89.4 kg   LMP 07/24/2024 (Exact Date)   SpO2 92%   BMI 32.80 kg/m   Physical Exam Vitals and nursing note reviewed.  Constitutional:      General: She is not in acute distress.    Appearance: Normal appearance. She  is not ill-appearing.  HENT:     Head: Normocephalic and atraumatic.     Nose: Nose normal.     Mouth/Throat:     Mouth: Mucous membranes are moist.  Eyes:     Extraocular Movements: Extraocular movements intact.     Conjunctiva/sclera: Conjunctivae normal.     Pupils: Pupils are equal, round, and reactive to light.  Cardiovascular:     Rate and Rhythm: Normal rate and regular rhythm.     Pulses: Normal pulses.     Heart sounds: Normal heart sounds. No murmur heard.    No gallop.  Pulmonary:     Effort: Pulmonary effort is normal. No respiratory distress.     Breath sounds: Normal breath sounds. No stridor. No  wheezing, rhonchi or rales.  Abdominal:     General: Abdomen is flat. Bowel sounds are normal. There is no distension.     Palpations: Abdomen is soft.     Tenderness: There is no abdominal tenderness. There is no guarding.  Musculoskeletal:        General: Normal range of motion.     Cervical back: Normal range of motion and neck supple.     Comments: Tenderness palpation noted over lower back diffusely, no step-off or deformity, no overlying erythema or warmth, no CVA tenderness, no other long bone or joint tenderness noted on exam  Skin:    General: Skin is warm and dry.  Neurological:     General: No focal deficit present.     Mental Status: She is alert and oriented to person, place, and time. Mental status is at baseline.     Cranial Nerves: No cranial nerve deficit.     Sensory: No sensory deficit.     Motor: No weakness.     Coordination: Coordination normal.     Gait: Gait normal.  Psychiatric:        Mood and Affect: Mood normal.        Behavior: Behavior normal.        Thought Content: Thought content normal.        Judgment: Judgment normal.     (all labs ordered are listed, but only abnormal results are displayed) Labs Reviewed  URINALYSIS, ROUTINE W REFLEX MICROSCOPIC - Abnormal; Notable for the following components:      Result Value   APPearance HAZY (*)    Glucose, UA >=500 (*)    Bacteria, UA RARE (*)    All other components within normal limits  CBC WITH DIFFERENTIAL/PLATELET - Abnormal; Notable for the following components:   Hemoglobin 15.1 (*)    All other components within normal limits  COMPREHENSIVE METABOLIC PANEL WITH GFR - Abnormal; Notable for the following components:   Glucose, Bld 187 (*)    All other components within normal limits  LIPASE, BLOOD  POC URINE PREG, ED    EKG: None  Radiology: CT Renal Stone Study Result Date: 08/21/2024 CLINICAL DATA:  Left flank pain. EXAM: CT ABDOMEN AND PELVIS WITHOUT CONTRAST TECHNIQUE:  Multidetector CT imaging of the abdomen and pelvis was performed following the standard protocol without IV contrast. RADIATION DOSE REDUCTION: This exam was performed according to the departmental dose-optimization program which includes automated exposure control, adjustment of the mA and/or kV according to patient size and/or use of iterative reconstruction technique. COMPARISON:  09/26/2017. FINDINGS: Lower chest: Scattered subsegmental volume loss. Heart is at the upper limits of normal in size. No pericardial or pleural effusion. Distal esophagus is grossly unremarkable. Hepatobiliary:  Liver is mildly heterogeneous in attenuation and is enlarged, 21.3 cm. Cholecystectomy. No biliary ductal dilatation. Pancreas: Negative. Spleen: Negative. Adrenals/Urinary Tract: Subcentimeter lateral limb right adrenal nodule, unchanged. No specific follow-up necessary. Left adrenal gland and kidneys are unremarkable. Ureters are decompressed. Bladder is grossly unremarkable. Stomach/Bowel: Stomach, small bowel, appendix and colon are unremarkable. Stool throughout the colon. Vascular/Lymphatic: Vascular structures are unremarkable. No pathologically enlarged lymph nodes. Reproductive: Uterus is visualized.  No adnexal mass. Other: No free fluid. Small supraumbilical midline ventral hernia contains fat. Mesenteries and peritoneum are otherwise unremarkable. Musculoskeletal: Bilateral sacroiliac joint fusion with hardware. L4-S1 posterior lumbar interbody fusion. L3-4 lateral lumbar interbody fusion. Degenerative changes in the spine. IMPRESSION: 1. No acute findings. 2. Stool throughout the colon is indicative of constipation. 3. Steatotic enlarged liver. 4. Small supraumbilical midline ventral hernia contains fat. Electronically Signed   By: Newell Eke M.D.   On: 08/21/2024 13:27     Procedures   Medications Ordered in the ED - No data to display                                  Medical Decision  Making Patient is doing well at this time and is stable for discharge home.  Discussed with patient urinalysis does not demonstrate indication for urinary tract infection at this point.  CT scan of the abdomen and pelvis is consistent with constipation.  She has nontender palpation over the site of the hernia.  Low suspicion for underlying etiology such as cauda equina syndrome, vertebral osteomyelitis, epidural abscess.  Abdominal exam is benign at this point and do not suspect acute appendicitis, cholecystitis, small bowel obstruction, diverticulitis, ovarian torsion or cyst, PID, tubo-ovarian abscess, pyelonephritis, kidney stone, pancreatitis, mesenteric ischemia.  Will continue symptomatic treatment of her constipation on an outpatient basis.  Patient is in agreement to this plan at this time.  The need for close follow-up with PCP was discussed as well as strict turn precautions for any new or worsening symptoms.  Patient voiced understanding to the plan and had no additional questions.  Amount and/or Complexity of Data Reviewed Labs: ordered. Radiology: ordered.  Risk OTC drugs.        Final diagnoses:  None    ED Discharge Orders     None          Daralene Lonni JONETTA DEVONNA 08/21/24 1457    Patsey Lot, MD 08/21/24 [phone]

## 2024-08-21 NOTE — Discharge Instructions (Addendum)
 Please continue MiraLAX  and Colace daily until symptoms resolved.  Please use the magnesium  citrate as provided.  Return to emergency department immediately for any new or worsening symptoms.  Follow-up closely with your primary care doctor.
